# Patient Record
Sex: Male | Born: 1940
Health system: Southern US, Community
[De-identification: ages and names within clinical notes are randomized; demographics above are authoritative.]

## PROBLEM LIST (undated history)

## (undated) DIAGNOSIS — M1710 Unilateral primary osteoarthritis, unspecified knee: Secondary | ICD-10-CM

## (undated) DIAGNOSIS — I4891 Unspecified atrial fibrillation: Secondary | ICD-10-CM

## (undated) DIAGNOSIS — I209 Angina pectoris, unspecified: Secondary | ICD-10-CM

## (undated) DIAGNOSIS — I499 Cardiac arrhythmia, unspecified: Secondary | ICD-10-CM

## (undated) DIAGNOSIS — Z7901 Long term (current) use of anticoagulants: Secondary | ICD-10-CM

## (undated) DIAGNOSIS — E785 Hyperlipidemia, unspecified: Secondary | ICD-10-CM

## (undated) DIAGNOSIS — F329 Major depressive disorder, single episode, unspecified: Secondary | ICD-10-CM

## (undated) DIAGNOSIS — F32A Depression, unspecified: Secondary | ICD-10-CM

## (undated) DIAGNOSIS — M179 Osteoarthritis of knee, unspecified: Secondary | ICD-10-CM

## (undated) DIAGNOSIS — Z860101 Personal history of adenomatous and serrated colon polyps: Secondary | ICD-10-CM

## (undated) DIAGNOSIS — Z955 Presence of coronary angioplasty implant and graft: Secondary | ICD-10-CM

## (undated) DIAGNOSIS — Z86010 Personal history of colonic polyps: Secondary | ICD-10-CM

## (undated) DIAGNOSIS — S83207A Unspecified tear of unspecified meniscus, current injury, left knee, initial encounter: Secondary | ICD-10-CM

## (undated) DIAGNOSIS — I2510 Atherosclerotic heart disease of native coronary artery without angina pectoris: Secondary | ICD-10-CM

## (undated) DIAGNOSIS — T7840XA Allergy, unspecified, initial encounter: Secondary | ICD-10-CM

## (undated) DIAGNOSIS — I10 Essential (primary) hypertension: Secondary | ICD-10-CM

## (undated) DIAGNOSIS — Z9189 Other specified personal risk factors, not elsewhere classified: Secondary | ICD-10-CM

## (undated) DIAGNOSIS — F419 Anxiety disorder, unspecified: Secondary | ICD-10-CM

## (undated) DIAGNOSIS — IMO0001 Reserved for inherently not codable concepts without codable children: Secondary | ICD-10-CM

## (undated) HISTORY — PX: JOINT REPLACEMENT: SHX530

## (undated) HISTORY — PX: OTHER SURGICAL HISTORY: SHX169

## (undated) HISTORY — PX: SHOULDER ARTHROSCOPY WITH ROTATOR CUFF REPAIR: SHX5685

## (undated) HISTORY — DX: Allergy, unspecified, initial encounter: T78.40XA

## (undated) HISTORY — PX: COLONOSCOPY: SHX174

## (undated) HISTORY — PX: HERNIA REPAIR: SHX51

## (undated) HISTORY — PX: VASECTOMY: SHX75

## (undated) HISTORY — DX: Hyperlipidemia, unspecified: E78.5

## (undated) HISTORY — DX: Depression, unspecified: F32.A

## (undated) HISTORY — DX: Atherosclerotic heart disease of native coronary artery without angina pectoris: I25.10

## (undated) HISTORY — PX: CARDIAC CATHETERIZATION: SHX172

## (undated) HISTORY — PX: KNEE ARTHROSCOPY: SUR90

## (undated) HISTORY — DX: Major depressive disorder, single episode, unspecified: F32.9

---

## 1967-01-27 HISTORY — PX: OTHER SURGICAL HISTORY: SHX169

## 1968-01-27 HISTORY — PX: TONSILLECTOMY: SUR1361

## 2001-03-08 ENCOUNTER — Ambulatory Visit (HOSPITAL_COMMUNITY): Admission: RE | Admit: 2001-03-08 | Discharge: 2001-03-08 | Payer: Self-pay | Admitting: *Deleted

## 2001-03-08 ENCOUNTER — Encounter: Payer: Self-pay | Admitting: Specialist

## 2001-03-23 ENCOUNTER — Encounter: Admission: RE | Admit: 2001-03-23 | Discharge: 2001-04-22 | Payer: Self-pay | Admitting: *Deleted

## 2006-02-25 ENCOUNTER — Encounter: Admission: RE | Admit: 2006-02-25 | Discharge: 2006-02-25 | Payer: Self-pay | Admitting: Specialist

## 2006-03-25 ENCOUNTER — Encounter: Admission: RE | Admit: 2006-03-25 | Discharge: 2006-03-25 | Payer: Self-pay | Admitting: Specialist

## 2006-05-25 ENCOUNTER — Inpatient Hospital Stay (HOSPITAL_COMMUNITY): Admission: RE | Admit: 2006-05-25 | Discharge: 2006-05-28 | Payer: Self-pay | Admitting: Orthopedic Surgery

## 2006-05-25 HISTORY — PX: TOTAL HIP ARTHROPLASTY: SHX124

## 2008-06-22 ENCOUNTER — Encounter: Admission: RE | Admit: 2008-06-22 | Discharge: 2008-06-22 | Payer: Self-pay | Admitting: Specialist

## 2009-01-26 HISTORY — PX: LAPAROSCOPIC CHOLECYSTECTOMY: SUR755

## 2010-02-16 ENCOUNTER — Encounter: Payer: Self-pay | Admitting: Specialist

## 2010-06-13 NOTE — Op Note (Signed)
NAMEBENZ, Eddie Price                ACCOUNT NO.:  [id number]   MEDICAL RECORD NO.:  [id number]          PATIENT TYPE:  INP   LOCATION:  0001                         FACILITY:  [facility]   PHYSICIAN:  Eddie Price, M.D.  DATE OF BIRTH:  03/07/1940   DATE OF PROCEDURE:  05/25/2006  DATE OF DISCHARGE:                               OPERATIVE REPORT   PREOPERATIVE DIAGNOSIS:  Right hip advanced osteoarthritis.   POSTOPERATIVE DIAGNOSIS:  Right hip advanced osteoarthritis.   PROCEDURE:  Right total hip placement.   COMPONENTS USED:  DePuy hip system size 54 pinnacle cup, 36 metal  neutral liner, Tri-Lock 13.8 lateralized offset stem with a 36 +8 Delta  ceramic ball.   SURGEON:  Eddie Price, M.D.   ASSISTANT:  Jamelle Rushing, PA-C.   ANESTHESIA:  General.   BLOOD LOSS:  400 cc.   DRAINS:  None.   COMPLICATIONS:  None   INDICATIONS FOR PROCEDURE:  [name] is a 70 year old gentleman kindly  referred for evaluation of his right hip.  He had failed conservative  measures including injections and anti-inflammatories.  He was having  decreased functional activity of life, decreased quality of life and  wished to proceed with surgical intervention.  We reviewed the risks of  infection, dislocation, DVT, need for future revision surgery as well as  the postoperative course and expectations.  Consent was obtained.   PROCEDURE IN DETAIL:  The patient was brought to operative theater.  Once adequate anesthesia and preoperative antibiotics, 2 grams of Ancef,  were administered the patient was positioned in the left lateral  decubitus position with the right side up.  The right hip area was pre  scrubbed and prepped and draped in standard fashion.  A lateral based  incision was made for posterior approach to the hip.  The iliotibial  band and gluteus fascia was incised in line with incision for posterior  approach.  The short external rotators were identified and taken down  separate from the posterior capsule.  An L capsulotomy was made  preserving the posterior leaflet for later anatomic repair as well as  protection against the sciatic nerve with retractors.  The hip was  dislocated.  A neck osteotomy was made based off anatomic landmarks  based on the center of the head in relationship with tip of the  trochanter.  Femoral head degenerative changes were noted with exposed  bone.   At this point attention was first directed to the femur.  Femoral  preparation was carried out per protocol for the Tri-Lock system with a  box osteotome set in anteversion at 20-25 degrees.  I then used a hand  reamer to open up the canal to allow for irrigation to prevent fat  emboli.  I then began broaching with the 7.5 broach and carried this all  the way up to a 13.8.  This had initial excellent fit with no torsional  motion.   I then packed the femoral canal with a sponge and attended to the  acetabulum.  Acetabular exposure was obtained with the retractor  placement  and labrumectomy.  I began reaming with the 45 reamer and  carried this up to 53.  The patient had a little bit of sclerotic bone.  With the use of 52 reamer, I was able to penetrate through to get good  subchondral bleeding bone.   Based on the appearance of his bone and the sclerotic nature of it, I  decided to not use the ASR component and instead used a pinnacle cup.  This would allow for further initial stability with a screw.  The 54  pinnacle cup was then impacted to the level of the visualized bone  beneath it.  I then used a single screw into the ileum with an excellent  purchase securing the fit.  Trial liner placed.  Attention was  redirected back to the femur where the 13.8 broach was placed.  This  broach sat at the level of the neck cut.  I trialed with the first 36  +1.5 and then all way up to an actually a 36 +8 ball.  With the +8 ball  in extension, there was 1 mm or 2 of shuck.  The leg  lengths appeared to  be identical to that in the preop position with the legs in a down  position.   The hip was very stable throughout the range of motion.  However, with  tension on the soft tissues I felt the 36 +8 ball would be the best.  This with affect leg lengths only by 1 mm compared to the +5 ball.  Given this, the final hole eliminator was placed into the cup following  removal of the trial components.  The 36 metal liner was placed.  The  13.8 lateralized offset stem was then impacted at the level where the  broach was placed.  I retrialed with the +8 to make sure that I was  happy with the range of motion and stability and it was just as the  trial was.  At this point the final 36 +8.5 ceramic ball was then  impacted on the clean and dry trunnion.  The hip was reduced.  The hip  range of motion was excellent with no evidence of impingement with soft  tissues tensioned appropriately . Again compared to the down leg from  the preoperative position the leg lengths appeared to be identical.   At this point the wound was irrigated.  I reapproximated the posterior  leaflet to the superior leaflet of the capsule using #1 Ethibond.  Following repeat irrigation, I injected 5 cc of FloSeal into the  posterior soft tissue musculature region to help decrease any potential  oozing.  The iliotibial band was then reapproximated using #1 Ethibond  and a #1 running Vicryl on the gluteal fascia.  The remaining wound was  closed with 2-0 Vicryl and a running 4-0 Monocryl.  The patient was  positioned supine on the hospital bed, extubated and brought to the  recovery room with a sterile dressing in place.      Eddie Price, M.D.  Electronically Signed     MDO/MEDQ  D:  05/25/2006  T:  05/25/2006  Job:  [id number]

## 2010-06-13 NOTE — H&P (Signed)
NAMEBERNHARD, Eddie Price                ACCOUNT NO.:  [id number]   MEDICAL RECORD NO.:  [id number]        PATIENT TYPE:  LINP   LOCATION:                               FACILITY:  [facility]   PHYSICIAN:  Madlyn Frankel. Charlann Boxer, M.D.  DATE OF BIRTH:  07-09-1940   DATE OF ADMISSION:  05/25/2006  DATE OF DISCHARGE:                              HISTORY & PHYSICAL   PROCEDURE:  Right total hip arthroplasty.   CHIEF COMPLAINTS:  Right hip pain.   HISTORY OF PRESENT ILLNESS:  This is a 70 year old male with a history  persistent progressive knee and hip pain since May 1997.  He was kindly  referred to [place] by Dr. Hayden Rasmussen.  His pain has been unrelieved with  any kind of conservative treatment.  He has been cleared presurgically  by Dr. Zoe Lan.   PAST MEDICAL HISTORY:  1. Osteoarthritis.  2. Hypertension.  3. Degenerative disk disease in his neck.   PAST SURGICAL HISTORY:  1. Tonsillectomy.  2. Bilateral shoulder surgery.  3. Left elbow surgeries, 1969.   FAMILY HISTORY:  The patient is married.  Primary caregiver will be his  wife after surgery.   DRUG ALLERGIES:  SULFA.   MEDICATIONS:  1. Atenolol 50 mg, one p.o. daily.  2. Zoloft 50 mg, one p.o. daily.  3. Aspirin 81 mg, one p.o. daily.  4. Mobic 7.5 mg, one p.o. daily which he has stopped.   REVIEW OF SYSTEMS:  None other than HPI.   PHYSICAL EXAMINATION:  VITAL SIGNS:  Pulse is 72, respirations 18, blood  pressure 129/82.  GENERAL:  He is awake, alert and oriented, well-developed, well-  nourished, no acute distress.  NECK:  Supple.  No carotid bruits.  CHEST/LUNGS:  Clear to auscultation bilaterally.  BREASTS:  Deferred.  HEART:  Regular rate and rhythm without gallops, clicks, rubs or  murmurs.  ABDOMEN:  Soft, nontender, nondistended.  Bowel sounds present all four  quadrants.  GENITOURINARY:  Deferred.  EXTREMITIES:  He walks with a Trendelenburg gait.  Painful to internal  range of motion right hip.  SKIN:  No  cellulitis.  NEUROLOGIC:  Intact distal sensibilities.   Labs, EKG, chest x-ray are all pending presurgical testing.   IMPRESSION:  Right hip osteoarthritis.   PLAN OF ACTION:  Right total hip arthroplasty at [facility] on May 25, 2006, by surgeon Dr. Durene Romans.  Questions were encouraged,  answered, and reviewed.  Risks and complications were discussed.     ______________________________  Yetta Glassman [address], [place]      Madlyn Frankel. Charlann Boxer, M.D.  Electronically Signed    BLM/MEDQ  D:  05/14/2006  T:  05/14/2006  Job:  [id number]   cc:   Zoe Lan, Dr.

## 2010-06-13 NOTE — Discharge Summary (Signed)
Eddie Price, Eddie Price                ACCOUNT NO.:  [id number]   MEDICAL RECORD NO.:  [id number]          PATIENT TYPE:  INP   LOCATION:  1616                         FACILITY:  [facility]   PHYSICIAN:  Madlyn Frankel. Charlann Boxer, M.D.  DATE OF BIRTH:  [birth year]   DATE OF ADMISSION:  05/25/2006  DATE OF DISCHARGE:  05/28/2006                               DISCHARGE SUMMARY   ADMISSION DIAGNOSES:  1. Osteoarthritis.  2. Degenerative disk disease.  3. Hypertension.   DISCHARGE DIAGNOSES:  1. Osteoarthritis.  2. Degenerative disk disease.  3. Hypertension.   HISTORY OF PRESENT ILLNESS:  This is a 70 year old male, has a history  of progressive knee and hip pain since 1997.  Pain in his hip has been  unrelieved with conservative measures.  Radiographic evaluation:  Displaced significant osteoarthritis with loss of joint space.  He had  diminished quality of life and was scheduled for a right total hip  replacement.   CONSULTATION:  None.   PROCEDURE:  Right total hip replacement.   SURGEON:  Dr. Durene Romans.   ASSISTANT:  Oneida Alar, PA-C.   COMPONENTS USED:  Was a metal wire with a ceramic femoral head.   LABS:  Pre-admission CBC, hematocrit 46.  Post-op day #1,  hemoglobin/hematocrit 33.6.  Postop day #2, hematocrit 31.6 and stable.  Pre-admission coagulation INR 1.0.  Pre-admission chemistries:  Sodium  142, potassium 5.0, glucose 113.  Post-op day #1, sodium 133, glucose  125.  Post-op day #2, sodium normalized at 139, glucose at 135.  His BUN  was a little low at 5.  Pre-admission GFR greater than 60 and remained  so, throughout his course of stay.  Calcium 9.6, pre-admission 7.8 post-  op day #1, 7.9 post-op day #2.  Chemistries within normal limits on GI workup.  Pre-admission UA all  negative.   EKG normal sinus rhythm.   Chest X-Ray:  No acute infiltrates or effusions, some chronic changes of  the thoracic spine.   HOSPITAL COURSE:  The patient underwent right total hip  replacement,  tolerated the procedure well, was admitted to the orthopedic floor.  His  pain was well-controlled throughout.  He remained neuromuscularly and  vascularly intact throughout his course of stay.  His bandages were  changed post-op day #2.  They were clean, dry and intact.  Wound with no  signs of infection, minimum ecchymosis, no serous ooze noted.  He was  weightbearing as tolerated throughout his course of stay.  He was Hep-  locked on post-op day #2.  DVT prophylaxis was started on post-op day  #1.  He progressed nicely with his physical therapy.  Upon discharge, he  had walked over 350 feet with the use of a rolling walker, progressing  nicely.  So, post-op day #3 he was ready for discharge for home health  care, physical therapy.   DISCHARGE DISPOSITION:  Discharge home stable and improved, to home  health care for PT and Lovenox administration.   DISCHARGE PHYSICAL THERAPY:  Goals of physical therapy:  Gait training,  maximize strength, minimize pain, increase range of motion,  encourage  independence in activities of daily living.  He is weightbearing as  tolerated with the use of a rolling walker for 2 weeks, at which point a  transition to single walking cane and then to non-assisted ambulation.   DISCHARGE MEDICATIONS:  Include:  1. Atenolol 50 mg one p.o. daily.  2. Zoloft 50 mg one p.o. daily.  3. Lovenox 40 mg, one subcu q. 24 x2 weeks, then enteric-coated      aspirin 325 mg x4 weeks, then enteric-coated aspirin 81 mg.  4. Robaxin 500 mg one p.o. q. 6  p.r.n. spasm.  5. Vicodin 1-2 p.o. q. 4-6 p.r.n. pain.  6. Colace 100 mg with p.o. b.i.d. constipation.  7. Iron 325 one p.o. t.i.d. x3 weeks.   DISCHARGE DIET:  Regular as tolerated by the patient.   DISCHARGE FOLLOWUP:  With Dr. Charlann Boxer  in two weeks for wound check.     ______________________________  Yetta Glassman. [address], [place]      Madlyn Frankel. Charlann Boxer, M.D.  Electronically Signed    BLM/MEDQ  D:   06/08/2006  T:  06/08/2006  Job:  [id number]

## 2010-11-16 ENCOUNTER — Emergency Department (HOSPITAL_COMMUNITY)
Admission: EM | Admit: 2010-11-16 | Discharge: 2010-11-16 | Disposition: A | Discharge status: Home / Self Care | Payer: Medicare Other | Attending: Emergency Medicine | Admitting: Emergency Medicine

## 2010-11-16 ENCOUNTER — Emergency Department (HOSPITAL_COMMUNITY): Payer: Medicare Other

## 2010-11-16 DIAGNOSIS — S8990XA Unspecified injury of unspecified lower leg, initial encounter: Secondary | ICD-10-CM | POA: Insufficient documentation

## 2010-11-16 DIAGNOSIS — IMO0002 Reserved for concepts with insufficient information to code with codable children: Secondary | ICD-10-CM | POA: Insufficient documentation

## 2010-11-16 DIAGNOSIS — W010XXA Fall on same level from slipping, tripping and stumbling without subsequent striking against object, initial encounter: Secondary | ICD-10-CM | POA: Insufficient documentation

## 2010-11-16 DIAGNOSIS — M25669 Stiffness of unspecified knee, not elsewhere classified: Secondary | ICD-10-CM | POA: Insufficient documentation

## 2010-11-16 DIAGNOSIS — Z7982 Long term (current) use of aspirin: Secondary | ICD-10-CM | POA: Insufficient documentation

## 2010-11-16 DIAGNOSIS — M25569 Pain in unspecified knee: Secondary | ICD-10-CM | POA: Insufficient documentation

## 2010-11-16 DIAGNOSIS — S99929A Unspecified injury of unspecified foot, initial encounter: Secondary | ICD-10-CM | POA: Insufficient documentation

## 2010-11-16 DIAGNOSIS — I10 Essential (primary) hypertension: Secondary | ICD-10-CM | POA: Insufficient documentation

## 2010-11-16 DIAGNOSIS — Z79899 Other long term (current) drug therapy: Secondary | ICD-10-CM | POA: Insufficient documentation

## 2012-06-13 ENCOUNTER — Ambulatory Visit (INDEPENDENT_AMBULATORY_CARE_PROVIDER_SITE_OTHER): Payer: Medicare Other | Admitting: Internal Medicine

## 2012-06-13 ENCOUNTER — Encounter: Payer: Self-pay | Admitting: *Deleted

## 2012-06-13 VITALS — BP 122/80 | HR 68 | Ht 71.0 in | Wt 229.2 lb

## 2012-06-13 DIAGNOSIS — K219 Gastro-esophageal reflux disease without esophagitis: Secondary | ICD-10-CM

## 2012-06-13 DIAGNOSIS — R079 Chest pain, unspecified: Secondary | ICD-10-CM

## 2012-06-13 DIAGNOSIS — I10 Essential (primary) hypertension: Secondary | ICD-10-CM | POA: Insufficient documentation

## 2012-06-13 DIAGNOSIS — Z79899 Other long term (current) drug therapy: Secondary | ICD-10-CM | POA: Insufficient documentation

## 2012-06-13 DIAGNOSIS — E789 Disorder of lipoprotein metabolism, unspecified: Secondary | ICD-10-CM | POA: Insufficient documentation

## 2012-06-13 DIAGNOSIS — F4323 Adjustment disorder with mixed anxiety and depressed mood: Secondary | ICD-10-CM

## 2012-06-13 DIAGNOSIS — J309 Allergic rhinitis, unspecified: Secondary | ICD-10-CM

## 2012-06-13 DIAGNOSIS — R109 Unspecified abdominal pain: Secondary | ICD-10-CM

## 2012-06-13 DIAGNOSIS — M5030 Other cervical disc degeneration, unspecified cervical region: Secondary | ICD-10-CM | POA: Insufficient documentation

## 2012-06-13 DIAGNOSIS — K7689 Other specified diseases of liver: Secondary | ICD-10-CM

## 2012-06-13 DIAGNOSIS — F528 Other sexual dysfunction not due to a substance or known physiological condition: Secondary | ICD-10-CM

## 2012-06-13 DIAGNOSIS — N529 Male erectile dysfunction, unspecified: Secondary | ICD-10-CM | POA: Insufficient documentation

## 2012-06-13 NOTE — Patient Instructions (Signed)
LABS TODAY:  Cbc, bmet, pt/inr, ptt  Your physician has requested that you have a cardiac catheterization. Cardiac catheterization is used to diagnose and/or treat various heart conditions. Doctors may recommend this procedure for a number of different reasons. The most common reason is to evaluate chest pain. Chest pain can be a symptom of coronary artery disease (CAD), and cardiac catheterization can show whether plaque is narrowing or blocking your heart's arteries. This procedure is also used to evaluate the valves, as well as measure the blood flow and oxygen levels in different parts of your heart. For further information please visit [email or web address] Please follow instruction sheet, as given.

## 2012-06-13 NOTE — Progress Notes (Signed)
HPI Patient referred from [facility] for CP  Patient seen on 5/13  Compliained of a 3 wk history of CP /SOB.  Substernal  Radiating to arms.  Occurs with moderate exertion.   No pain at rest.   Had stress test in [place] approx 10 years ago.  Woke up with CP then  Different from current symptoms.  Lipids in December LDL was 91, HDL 29, Trig 198 Allergies  Allergen Reactions  . Sulfa Antibiotics     RASH    Current Outpatient Prescriptions  Medication Sig Dispense Refill  . aspirin 81 MG tablet Take 81 mg by mouth daily.      Marland Kitchen atenolol (TENORMIN) 50 MG tablet       . fish oil-omega-3 fatty acids 1000 MG capsule Take 2 g by mouth daily.      . Multiple Vitamin (MULTIVITAMIN) capsule Take 1 capsule by mouth daily.      Marland Kitchen omega-3 acid ethyl esters (LOVAZA) 1 G capsule Take 2 g by mouth daily.       . sertraline (ZOLOFT) 100 MG tablet Take 100 mg by mouth daily.      . sildenafil (VIAGRA) 100 MG tablet Take 100 mg by mouth daily as needed for erectile dysfunction.      . vitamin E (VITAMIN E) 400 UNIT capsule Take 800 Units by mouth daily.       No current facility-administered medications for this visit.    No past medical history on file.  Past Surgical History  Procedure Laterality Date  . Complete colonoscopy  07-2010  . Knee surgery Right   . Choecystectomy    . Tonsillectomy    . Shoulder surgery Bilateral   . Total hip arthroplasty      Family History  Problem Relation Age of Onset  . Heart failure Mother   . Heart failure Father   . Stroke Father   . Heart attack Father     History   Social History  . Marital Status: Married    Spouse Name: N/A    Number of Children: N/A  . Years of Education: N/A   Occupational History  . Not on file.   Social History Main Topics  . Smoking status: Not on file  . Smokeless tobacco: Not on file  . Alcohol Use: Not on file  . Drug Use: Not on file  . Sexually Active: Not on file   Other Topics Concern  . Not on  file   Social History Narrative  . No narrative on file    Review of Systems:  All systems reviewed.  They are negative to the above problem except as previously stated.  Vital Signs: BP 122/80  Pulse 68  Ht 5\' 11"  (1.803 m)  Wt 229 lb 4 oz (103.987 kg)  BMI 31.99 kg/m2  Physical Exam  HEENT:  Normocephalic, atraumatic. EOMI, PERRLA.  Neck: JVP is normal.  No bruits.  Lungs: clear to auscultation. No rales no wheezes.  Heart: Regular rate and rhythm. Normal S1, S2. No S3.   No significant murmurs. PMI not displaced.  Abdomen:  Supple, nontender. Normal bowel sounds. No masses. No hepatomegaly.  Extremities:   Good distal pulses throughout. No lower extremity edema.  Musculoskeletal :moving all extremities.  Neuro:   alert and oriented x3.  CN II-XII grossly intact.  EKG  5/13:  SR. 64 bpm Assessment and Plan:  1.  CP   History consistent with exertional angina.  No rest symptoms  I would recomm L heart cath to define anatomy.  Discussed procedure, risks/benefits with patient  He understands and agrees to proceed Keep on same medicines.  NTG prn  Come to hosp if worsens prior.  2  HL  Keep on same regimen for now  Define anatomy.  3.  Obesity  Will need to address on future visits.

## 2012-06-14 ENCOUNTER — Encounter: Payer: Self-pay | Admitting: Internal Medicine

## 2012-06-14 ENCOUNTER — Encounter: Payer: Self-pay | Admitting: *Deleted

## 2012-06-14 LAB — CBC WITH DIFFERENTIAL/PLATELET
Eosinophils Absolute: 0.3 10*3/uL (ref 0.0–0.7)
Eosinophils Relative: 5 % (ref 0–5)
HCT: 45.7 % (ref 39.0–52.0)
Lymphocytes Relative: 39 % (ref 12–46)
Lymphs Abs: 2.8 10*3/uL (ref 0.7–4.0)
MCH: 32.6 pg (ref 26.0–34.0)
MCV: 94.4 fL (ref 78.0–100.0)
Monocytes Absolute: 0.9 10*3/uL (ref 0.1–1.0)
Monocytes Relative: 12 % (ref 3–12)
Platelets: 241 10*3/uL (ref 150–400)
RBC: 4.84 MIL/uL (ref 4.22–5.81)
WBC: 7.3 10*3/uL (ref 4.0–10.5)

## 2012-06-14 LAB — BASIC METABOLIC PANEL
BUN: 9 mg/dL (ref 6–23)
CO2: 28 mEq/L (ref 19–32)
Calcium: 9.9 mg/dL (ref 8.4–10.5)
Chloride: 105 mEq/L (ref 96–112)
Creat: 1.06 mg/dL (ref 0.50–1.35)
Glucose, Bld: 81 mg/dL (ref 70–99)

## 2012-06-14 LAB — APTT: aPTT: 44 seconds — ABNORMAL HIGH (ref 24–37)

## 2012-06-15 ENCOUNTER — Inpatient Hospital Stay (HOSPITAL_BASED_OUTPATIENT_CLINIC_OR_DEPARTMENT_OTHER)
Admission: RE | Admit: 2012-06-15 | Discharge: 2012-06-15 | Disposition: A | Discharge status: Hospice | Payer: Medicare Other | Source: Ambulatory Visit | Attending: Cardiovascular Disease | Admitting: Cardiovascular Disease

## 2012-06-15 ENCOUNTER — Inpatient Hospital Stay (HOSPITAL_COMMUNITY)
Admission: AD | Admit: 2012-06-15 | Discharge: 2012-06-18 | DRG: 247 | Disposition: A | Discharge status: Home / Self Care | Payer: Medicare Other | Source: Ambulatory Visit | Attending: Cardiovascular Disease | Admitting: Cardiovascular Disease

## 2012-06-15 ENCOUNTER — Encounter (HOSPITAL_COMMUNITY): Payer: Self-pay | Admitting: *Deleted

## 2012-06-15 ENCOUNTER — Encounter (HOSPITAL_BASED_OUTPATIENT_CLINIC_OR_DEPARTMENT_OTHER)
Admission: RE | Disposition: A | Discharge status: Hospice | Payer: Self-pay | Source: Ambulatory Visit | Attending: Cardiovascular Disease

## 2012-06-15 DIAGNOSIS — M5030 Other cervical disc degeneration, unspecified cervical region: Secondary | ICD-10-CM

## 2012-06-15 DIAGNOSIS — I10 Essential (primary) hypertension: Secondary | ICD-10-CM | POA: Insufficient documentation

## 2012-06-15 DIAGNOSIS — I200 Unstable angina: Secondary | ICD-10-CM | POA: Insufficient documentation

## 2012-06-15 DIAGNOSIS — Z955 Presence of coronary angioplasty implant and graft: Secondary | ICD-10-CM

## 2012-06-15 DIAGNOSIS — R109 Unspecified abdominal pain: Secondary | ICD-10-CM

## 2012-06-15 DIAGNOSIS — J309 Allergic rhinitis, unspecified: Secondary | ICD-10-CM

## 2012-06-15 DIAGNOSIS — K7689 Other specified diseases of liver: Secondary | ICD-10-CM

## 2012-06-15 DIAGNOSIS — Z7982 Long term (current) use of aspirin: Secondary | ICD-10-CM

## 2012-06-15 DIAGNOSIS — F4323 Adjustment disorder with mixed anxiety and depressed mood: Secondary | ICD-10-CM

## 2012-06-15 DIAGNOSIS — E785 Hyperlipidemia, unspecified: Secondary | ICD-10-CM | POA: Diagnosis present

## 2012-06-15 DIAGNOSIS — IMO0002 Reserved for concepts with insufficient information to code with codable children: Secondary | ICD-10-CM | POA: Diagnosis not present

## 2012-06-15 DIAGNOSIS — R0602 Shortness of breath: Secondary | ICD-10-CM | POA: Insufficient documentation

## 2012-06-15 DIAGNOSIS — E7800 Pure hypercholesterolemia, unspecified: Secondary | ICD-10-CM | POA: Diagnosis present

## 2012-06-15 DIAGNOSIS — Y9210 Unspecified residential institution as the place of occurrence of the external cause: Secondary | ICD-10-CM | POA: Diagnosis not present

## 2012-06-15 DIAGNOSIS — Z7902 Long term (current) use of antithrombotics/antiplatelets: Secondary | ICD-10-CM

## 2012-06-15 DIAGNOSIS — I2510 Atherosclerotic heart disease of native coronary artery without angina pectoris: Principal | ICD-10-CM

## 2012-06-15 DIAGNOSIS — F528 Other sexual dysfunction not due to a substance or known physiological condition: Secondary | ICD-10-CM

## 2012-06-15 DIAGNOSIS — E669 Obesity, unspecified: Secondary | ICD-10-CM | POA: Insufficient documentation

## 2012-06-15 DIAGNOSIS — Y840 Cardiac catheterization as the cause of abnormal reaction of the patient, or of later complication, without mention of misadventure at the time of the procedure: Secondary | ICD-10-CM | POA: Diagnosis not present

## 2012-06-15 DIAGNOSIS — Z79899 Other long term (current) drug therapy: Secondary | ICD-10-CM

## 2012-06-15 DIAGNOSIS — K219 Gastro-esophageal reflux disease without esophagitis: Secondary | ICD-10-CM

## 2012-06-15 DIAGNOSIS — E789 Disorder of lipoprotein metabolism, unspecified: Secondary | ICD-10-CM

## 2012-06-15 DIAGNOSIS — R079 Chest pain, unspecified: Secondary | ICD-10-CM | POA: Insufficient documentation

## 2012-06-15 DIAGNOSIS — N529 Male erectile dysfunction, unspecified: Secondary | ICD-10-CM

## 2012-06-15 HISTORY — DX: Essential (primary) hypertension: I10

## 2012-06-15 HISTORY — DX: Cardiac arrhythmia, unspecified: I49.9

## 2012-06-15 HISTORY — DX: Angina pectoris, unspecified: I20.9

## 2012-06-15 LAB — CBC WITH DIFFERENTIAL/PLATELET
Basophils Absolute: 0 10*3/uL (ref 0.0–0.1)
Basophils Relative: 1 % (ref 0–1)
Hemoglobin: 14.7 g/dL (ref 13.0–17.0)
Lymphocytes Relative: 42 % (ref 12–46)
MCHC: 34.7 g/dL (ref 30.0–36.0)
Neutro Abs: 3.1 10*3/uL (ref 1.7–7.7)
Neutrophils Relative %: 46 % (ref 43–77)
RDW: 13.1 % (ref 11.5–15.5)
WBC: 6.6 10*3/uL (ref 4.0–10.5)

## 2012-06-15 LAB — COMPREHENSIVE METABOLIC PANEL
ALT: 15 U/L (ref 0–53)
Alkaline Phosphatase: 42 U/L (ref 39–117)
Chloride: 103 mEq/L (ref 96–112)
Creatinine, Ser: 0.83 mg/dL (ref 0.50–1.35)
GFR calc Af Amer: >90 mL/min (ref >90)
GFR calc non Af Amer: 88 mL/min — ABNORMAL LOW (ref >90)
Potassium: 4.4 mEq/L (ref 3.5–5.1)

## 2012-06-15 LAB — TROPONIN I: Troponin I: <0.3 ng/mL (ref <0.30)

## 2012-06-15 SURGERY — JV LEFT HEART CATHETERIZATION WITH CORONARY ANGIOGRAM
Anesthesia: Moderate Sedation

## 2012-06-15 MED ORDER — ATORVASTATIN CALCIUM 40 MG PO TABS
40.0000 mg | ORAL_TABLET | Freq: Every day | ORAL | Status: DC
  Administered 2012-06-15 – 2012-06-17 (×3): 40 mg via ORAL
  Filled 2012-06-15 (×5): qty 1

## 2012-06-15 MED ORDER — SODIUM CHLORIDE 0.9 % IV SOLN: INTRAVENOUS | Status: DC

## 2012-06-15 MED ORDER — HEPARIN (PORCINE) IN NACL 100-0.45 UNIT/ML-% IJ SOLN
1500.0000 [IU]/h | INTRAMUSCULAR | Status: DC
  Administered 2012-06-15: 1350 [IU]/h via INTRAVENOUS
  Administered 2012-06-16 – 2012-06-17 (×3): 1500 [IU]/h via INTRAVENOUS
  Filled 2012-06-15 (×6): qty 250

## 2012-06-15 MED ORDER — ACETAMINOPHEN 325 MG PO TABS: 650.0000 mg | ORAL_TABLET | ORAL | Status: DC | PRN

## 2012-06-15 MED ORDER — ASPIRIN EC 81 MG PO TBEC
81.0000 mg | DELAYED_RELEASE_TABLET | Freq: Every day | ORAL | Status: DC
  Administered 2012-06-16 – 2012-06-18 (×2): 81 mg via ORAL
  Filled 2012-06-15 (×3): qty 1

## 2012-06-15 MED ORDER — NITROGLYCERIN 0.4 MG SL SUBL: 0.4000 mg | SUBLINGUAL_TABLET | SUBLINGUAL | Status: DC | PRN

## 2012-06-15 MED ORDER — ATENOLOL 50 MG PO TABS
50.0000 mg | ORAL_TABLET | Freq: Every day | ORAL | Status: DC
  Administered 2012-06-15 – 2012-06-18 (×3): 50 mg via ORAL
  Filled 2012-06-15 (×4): qty 1

## 2012-06-15 MED ORDER — GUAIFENESIN 100 MG/5ML PO SYRP: 200.0000 mg | ORAL_SOLUTION | ORAL | Status: DC | PRN

## 2012-06-15 MED ORDER — ASPIRIN 81 MG PO CHEW
324.0000 mg | CHEWABLE_TABLET | ORAL | Status: AC
  Administered 2012-06-15: 324 mg via ORAL
  Filled 2012-06-15: qty 3
  Filled 2012-06-15: qty 1

## 2012-06-15 MED ORDER — SERTRALINE HCL 100 MG PO TABS
100.0000 mg | ORAL_TABLET | Freq: Every day | ORAL | Status: DC
  Administered 2012-06-15 – 2012-06-16 (×2): 100 mg via ORAL
  Administered 2012-06-18: 50 mg via ORAL
  Filled 2012-06-15 (×4): qty 1

## 2012-06-15 MED ORDER — ONDANSETRON HCL 4 MG/2ML IJ SOLN
4.0000 mg | Freq: Four times a day (QID) | INTRAMUSCULAR | Status: DC | PRN
  Filled 2012-06-15: qty 2

## 2012-06-15 MED ORDER — CLOPIDOGREL BISULFATE 300 MG PO TABS
600.0000 mg | ORAL_TABLET | Freq: Once | ORAL | Status: AC
  Administered 2012-06-15: 600 mg via ORAL
  Filled 2012-06-15: qty 2

## 2012-06-15 MED ORDER — CLOPIDOGREL BISULFATE 75 MG PO TABS
75.0000 mg | ORAL_TABLET | Freq: Every day | ORAL | Status: DC
  Administered 2012-06-16 – 2012-06-17 (×2): 75 mg via ORAL
  Filled 2012-06-15 (×4): qty 1

## 2012-06-15 MED ORDER — ONDANSETRON HCL 4 MG/2ML IJ SOLN: 4.0000 mg | Freq: Four times a day (QID) | INTRAMUSCULAR | Status: DC | PRN

## 2012-06-15 MED ORDER — ASPIRIN 300 MG RE SUPP
300.0000 mg | RECTAL | Status: AC
  Filled 2012-06-15: qty 1

## 2012-06-15 NOTE — OR Nursing (Signed)
+  Allen's right hand

## 2012-06-15 NOTE — CV Procedure (Addendum)
Cardiac Catheterization Operative Report  LANDER ESLICK [id number] 5/21/201411:53 AM No primary provider on file.  Procedure Performed:  1. Left Heart Catheterization 2. Selective Coronary Angiography 3. Left ventricular angiogram  Operator: Verne Carrow, MD  Indication:  72 yo male with history of  HTN with recent complaints exertional chest pain and SOB. He describes chest pain radiating to both arms with minimal exertion. (class III angina). No functional study.                                Procedure Details: The risks, benefits, complications, treatment options, and expected outcomes were discussed with the patient. The patient and/or family concurred with the proposed plan, giving informed consent. The patient was brought to the cath lab after IV hydration was begun and oral premedication was given. The patient was further sedated with Versed and Fentanyl. The right groin was prepped and draped in the usual manner. Using the modified Seldinger access technique, a 4 French sheath was placed in the right femoral artery.  A JL-5 catheter was used to engage the left main artery. Selective angiography of the left coronary system was performed. A 3DRC catheter was used to non-selectively visualize the RCA. The RCA has a severe ostial stenosis.  A pigtail catheter was used to perform a left ventricular angiogram.  There were no immediate complications. The patient was taken to the recovery area in stable condition.   Hemodynamic Findings: Central aortic pressure: 113/53 Left ventricular pressure: 141/11/20  Angiographic Findings:  Left main: No obstructive disease.   Left Anterior Descending Artery: Moderate caliber vessel that courses to the apex. The proximal vessel has mild plaque disease. The mid vessel has a 30% stenosis at the takeoff of the small caliber diagonal branch. The remainder of the LAD has mild plaque disease. The small caliber diagonal branch (1.5-1.75 mm)  has a focal 99% stenosis.   Circumflex Artery: Small to moderate caliber vessel with 20% ostial stenosis. The first obtuse marginal branch is small in caliber (1.5 -1.75 mm) with proximal 80% stenosis. The second obtuse marginal branch is small in caliber (1.5 mm vessel) with focal 99% stenosis.   Right Coronary Artery: Large caliber, dominant vessel with 99% ostial stenosis. The mid vessel has a 30-40% stenosis. The distal vessel has an eccentric 20% stenosis. The PDA is a small caliber vessel with no obstructive disease. The posterolateral branch is a medium caliber vessel with no obstructive disease.   Left Ventricular Angiogram: LVEF 55%.   Impression: 1. 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  2. Preserved LV systolic function 3. Unstable angina, class III.   Recommendations: He has three vessel CAD. He is having unstable symptoms. Will admit to telemetry unit. Will start ASA and begin a heparin drip. Will start statin/beta blocker. Will review films with colleagues. He will need revascularization with stenting or CABG. His branch vessels are small in caliber and I am not sure CABG would give a good result. His LAD has no focal lesions. I am leaning toward PCI of the RCA in the next 1-2 days depending on the cath lab schedule and then reassess the lesions in the left system. It appears that the stenoses in the OM branches and diagonal branch are severe but the vessels are small in caliber. Will wait to load with Plavix until  CABG has been excluded as an option for therapy.        Complications:  None. The patient tolerated the procedure well.

## 2012-06-15 NOTE — OR Nursing (Signed)
Report called to [place], RN on 2000

## 2012-06-15 NOTE — OR Nursing (Signed)
Tegaderm dressing applied, site level 0, bedrest begins at 1205

## 2012-06-15 NOTE — OR Nursing (Signed)
Transported to 2028 via stretcher, on monitor, without change

## 2012-06-15 NOTE — Progress Notes (Signed)
ANTICOAGULATION CONSULT NOTE - Initial Consult  Pharmacy Consult for Heparin Indication: chest pain/ACS  Allergies  Allergen Reactions  . Sulfa Antibiotics     RASH    Patient Measurements: Height: 5\' 11"  (180.3 cm) Weight: 229 lb (103.874 kg) IBW/kg (Calculated) : 75.3 Heparin Dosing Weight: 97.1 kg  Vital Signs: BP: 126/80 mmHg (05/21 1314)  Labs:  Recent Labs  06/13/12 1355  HGB 15.8  HCT 45.7  PLT 241  APTT 44*  LABPROT 12.8  INR 0.96  CREATININE 1.06    Estimated Creatinine Clearance: 80.7 ml/min (by C-G formula based on Cr of 1.06).   Medical History: No past medical history on file.  Medications:  Prescriptions prior to admission  Medication Sig Dispense Refill  . aspirin 81 MG tablet Take 81 mg by mouth daily.      Marland Kitchen atenolol (TENORMIN) 50 MG tablet       . fish oil-omega-3 fatty acids 1000 MG capsule Take 2 g by mouth daily.      . Multiple Vitamin (MULTIVITAMIN) capsule Take 1 capsule by mouth daily.      Marland Kitchen omega-3 acid ethyl esters (LOVAZA) 1 G capsule Take 2 g by mouth daily.       . sertraline (ZOLOFT) 100 MG tablet Take 100 mg by mouth daily.      . sildenafil (VIAGRA) 100 MG tablet Take 100 mg by mouth daily as needed for erectile dysfunction.      . vitamin E (VITAMIN E) 400 UNIT capsule Take 800 Units by mouth daily.        Assessment: 72 y/o male who had cath today after complaints of exertional chest pain and SOB. He was found to have 3 vessel CAD and is being evaluated for revascularization vs CABG. Pharmacy consulted to begin a heparin drip. Sheath was pulled at ~11:45 this morning. Spoke with Dr. Clifton James and he would like the heparin to begin 6 hrs post sheath removal.  Goal of Therapy:  Heparin level 0.3-0.7 units/ml Monitor platelets by anticoagulation protocol: Yes   Plan:  -Begin heparin drip at 1350 units/hr IV with no bolus at 18:00 -Heparin level 6 hours after started -Daily heparin level and CBC while on heparin -Monitor  for signs/symptoms of bleeding  [facility], [place].D., BCPS Clinical Pharmacist Pager: [phone] 06/15/2012 2:05 PM

## 2012-06-15 NOTE — Interval H&P Note (Signed)
History and Physical Interval Note:  06/15/2012 11:10 AM  Eddie Price  has presented today for cardiac cath with the diagnosis of CP  The various methods of treatment have been discussed with the patient and family. After consideration of risks, benefits and other options for treatment, the patient has consented to  Procedure(s): JV LEFT HEART CATHETERIZATION WITH CORONARY ANGIOGRAM (N/A) as a surgical intervention .  The patient's history has been reviewed, patient examined, no change in status, stable for surgery.  I have reviewed the patient's chart and labs.  Questions were answered to the patient's satisfaction.     MCALHANY,CHRISTOPHER

## 2012-06-15 NOTE — OR Nursing (Signed)
Dr McAlhany at bedside to discuss results and treatment plan with pt and family 

## 2012-06-15 NOTE — H&P (View-Only) (Signed)
HPI Patient referred from [facility] for CP  Patient seen on 5/13  Compliained of a 3 wk history of CP /SOB.  Substernal  Radiating to arms.  Occurs with moderate exertion.   No pain at rest.   Had stress test in [place] approx 10 years ago.  Woke up with CP then  Different from current symptoms.  Lipids in December LDL was 91, HDL 29, Trig 198 Allergies  Allergen Reactions  . Sulfa Antibiotics     RASH    Current Outpatient Prescriptions  Medication Sig Dispense Refill  . aspirin 81 MG tablet Take 81 mg by mouth daily.      . atenolol (TENORMIN) 50 MG tablet       . fish oil-omega-3 fatty acids 1000 MG capsule Take 2 g by mouth daily.      . Multiple Vitamin (MULTIVITAMIN) capsule Take 1 capsule by mouth daily.      . omega-3 acid ethyl esters (LOVAZA) 1 G capsule Take 2 g by mouth daily.       . sertraline (ZOLOFT) 100 MG tablet Take 100 mg by mouth daily.      . sildenafil (VIAGRA) 100 MG tablet Take 100 mg by mouth daily as needed for erectile dysfunction.      . vitamin E (VITAMIN E) 400 UNIT capsule Take 800 Units by mouth daily.       No current facility-administered medications for this visit.    No past medical history on file.  Past Surgical History  Procedure Laterality Date  . Complete colonoscopy  07-2010  . Knee surgery Right   . Choecystectomy    . Tonsillectomy    . Shoulder surgery Bilateral   . Total hip arthroplasty      Family History  Problem Relation Age of Onset  . Heart failure Mother   . Heart failure Father   . Stroke Father   . Heart attack Father     History   Social History  . Marital Status: Married    Spouse Name: N/A    Number of Children: N/A  . Years of Education: N/A   Occupational History  . Not on file.   Social History Main Topics  . Smoking status: Not on file  . Smokeless tobacco: Not on file  . Alcohol Use: Not on file  . Drug Use: Not on file  . Sexually Active: Not on file   Other Topics Concern  . Not on  file   Social History Narrative  . No narrative on file    Review of Systems:  All systems reviewed.  They are negative to the above problem except as previously stated.  Vital Signs: BP 122/80  Pulse 68  Ht 5' 11" (1.803 m)  Wt 229 lb 4 oz (103.987 kg)  BMI 31.99 kg/m2  Physical Exam  HEENT:  Normocephalic, atraumatic. EOMI, PERRLA.  Neck: JVP is normal.  No bruits.  Lungs: clear to auscultation. No rales no wheezes.  Heart: Regular rate and rhythm. Normal S1, S2. No S3.   No significant murmurs. PMI not displaced.  Abdomen:  Supple, nontender. Normal bowel sounds. No masses. No hepatomegaly.  Extremities:   Good distal pulses throughout. No lower extremity edema.  Musculoskeletal :moving all extremities.  Neuro:   alert and oriented x3.  CN II-XII grossly intact.  EKG  5/13:  SR. 64 bpm Assessment and Plan:  1.  CP   History consistent with exertional angina.  No rest symptoms    I would recomm L heart cath to define anatomy.  Discussed procedure, risks/benefits with patient  He understands and agrees to proceed Keep on same medicines.  NTG prn  Come to hosp if worsens prior.  2  HL  Keep on same regimen for now  Define anatomy.  3.  Obesity  Will need to address on future visits.  

## 2012-06-15 NOTE — OR Nursing (Signed)
Meal served 

## 2012-06-15 NOTE — Progress Notes (Signed)
Utilization Review Completed.Eddie Price T5/21/2014  

## 2012-06-16 ENCOUNTER — Encounter (HOSPITAL_COMMUNITY): Payer: Self-pay | Admitting: *Deleted

## 2012-06-16 DIAGNOSIS — I2510 Atherosclerotic heart disease of native coronary artery without angina pectoris: Principal | ICD-10-CM | POA: Diagnosis present

## 2012-06-16 DIAGNOSIS — I200 Unstable angina: Secondary | ICD-10-CM | POA: Diagnosis present

## 2012-06-16 LAB — LIPID PANEL
Cholesterol: 149 mg/dL (ref 0–200)
Triglycerides: 239 mg/dL — ABNORMAL HIGH (ref <150)

## 2012-06-16 LAB — TROPONIN I: Troponin I: <0.3 ng/mL (ref <0.30)

## 2012-06-16 LAB — BASIC METABOLIC PANEL
Calcium: 9 mg/dL (ref 8.4–10.5)
GFR calc Af Amer: >90 mL/min (ref >90)
GFR calc non Af Amer: 84 mL/min — ABNORMAL LOW (ref >90)
Potassium: 4.1 mEq/L (ref 3.5–5.1)
Sodium: 137 mEq/L (ref 135–145)

## 2012-06-16 LAB — HEPARIN LEVEL (UNFRACTIONATED)
Heparin Unfractionated: 0.18 IU/mL — ABNORMAL LOW (ref 0.30–0.70)
Heparin Unfractionated: 0.37 IU/mL (ref 0.30–0.70)

## 2012-06-16 MED ORDER — SODIUM CHLORIDE 0.9 % IV SOLN: 250.0000 mL | INTRAVENOUS | Status: DC | PRN

## 2012-06-16 MED ORDER — SODIUM CHLORIDE 0.9 % IJ SOLN: 3.0000 mL | Freq: Two times a day (BID) | INTRAMUSCULAR | Status: DC

## 2012-06-16 MED ORDER — SODIUM CHLORIDE 0.9 % IJ SOLN: 3.0000 mL | INTRAMUSCULAR | Status: DC | PRN

## 2012-06-16 NOTE — Progress Notes (Signed)
ANTICOAGULATION CONSULT NOTE  Pharmacy Consult for Heparin Indication: chest pain/ACS  Allergies  Allergen Reactions  . Sulfa Antibiotics     RASH    Patient Measurements: Height: 5\' 11"  (180.3 cm) Weight: 226 lb 3.1 oz (102.6 kg) IBW/kg (Calculated) : 75.3 Heparin Dosing Weight: 97.1 kg  Vital Signs: Temp: 98 F (36.7 C) (05/22 0413) Temp src: Oral (05/22 0413) BP: 146/77 mmHg (05/22 0413) Pulse Rate: 69 (05/22 0413)  Labs:  Recent Labs  06/13/12 1355 06/15/12 1427 06/15/12 1428 06/15/12 2021 06/16/12 0044 06/16/12 0940  HGB 15.8  --  14.7  --   --   --   HCT 45.7  --  42.4  --   --   --   PLT 241  --  197  --   --   --   APTT 44*  --   --   --   --   --   LABPROT 12.8  --   --   --   --   --   INR 0.96  --   --   --   --   --   HEPARINUNFRC  --   --   --   --  0.18* 0.37  CREATININE 1.06  --  0.83  --   --   --   TROPONINI  --  <0.30  --  <0.30 <0.30  --     Estimated Creatinine Clearance: 102.4 ml/min (by C-G formula based on Cr of 0.83).  Assessment: 72 y/o male s/p cardiac cath receiving heparin for ACS, heparin level now therapeutic. Plan for recath and PCI tomorrow.  Goal of Therapy:  Heparin level 0.3-0.7 units/ml Monitor platelets by anticoagulation protocol: Yes   Plan:  Continue heparin gtt at 1500 units/hr and f/u in am.  Verlene Mayer, PharmD, BCPS Pager [phone] 06/16/2012 10:41 AM

## 2012-06-16 NOTE — Progress Notes (Signed)
    SUBJECTIVE: No chest pain. No events.   BP 146/77  Pulse 69  Temp(Src) 98 F (36.7 C) (Oral)  Resp 20  Ht 5\' 11"  (1.803 m)  Wt 226 lb 3.1 oz (102.6 kg)  BMI 31.56 kg/m2  SpO2 98%  Intake/Output Summary (Last 24 hours) at 06/16/12 0740 Last data filed at 06/16/12 0414  Gross per 24 hour  Intake    360 ml  Output    200 ml  Net    160 ml    PHYSICAL EXAM General: Well developed, well nourished, in no acute distress. Alert and oriented x 3.  Psych:  Good affect, responds appropriately Neck: No JVD. No masses noted.  Lungs: Clear bilaterally with no wheezes or rhonci noted.  Heart: RRR with no murmurs noted. Abdomen: Bowel sounds are present. Soft, non-tender.  Extremities: No lower extremity edema.   LABS: Basic Metabolic Panel:  Recent Labs  65/78/46 1355 06/15/12 1428  NA 140 139  K 4.7 4.4  CL 105 103  CO2 28 26  GLUCOSE 81 124*  BUN 9 10  CREATININE 1.06 0.83  CALCIUM 9.9 8.8  MG  --  1.7   CBC:  Recent Labs  06/13/12 1355 06/15/12 1428  WBC 7.3 6.6  NEUTROABS 3.2 3.1  HGB 15.8 14.7  HCT 45.7 42.4  MCV 94.4 93.4  PLT 241 197   Cardiac Enzymes:  Recent Labs  06/15/12 1427 06/15/12 2021 06/16/12 0044  TROPONINI <0.30 <0.30 <0.30   Current Meds: . aspirin EC  81 mg Oral Daily  . atenolol  50 mg Oral Daily  . atorvastatin  40 mg Oral q1800  . clopidogrel  75 mg Oral Q breakfast  . sertraline  100 mg Oral Daily     ASSESSMENT AND PLAN:  1. CAD/Unstable angina:  Pt admitted following cardiac cath yesterday. He was found to have a severe ostial RCA stenosis as well as severe disease in a small to moderate caliber diagonal branch, small caliber OM1/OM2 branches. I have reviewed his films with my colleagues and I think PCI is the right approach. He has been loaded with Plavix. Will continue ASA, Plavix, statin, beta blocker. Continue heparin drip. Plan PCI of RCA and likely diagonal branch tomorrow am. Clear liquid breakfast in am then NPO  with plans for PCI after lunchtime.  Will place cath/PCI orders.   Eddie Price  5/22/20147:40 AM

## 2012-06-16 NOTE — Progress Notes (Signed)
ANTICOAGULATION CONSULT NOTE  Pharmacy Consult for Heparin Indication: chest pain/ACS  Allergies  Allergen Reactions  . Sulfa Antibiotics     RASH    Patient Measurements: Height: 5\' 11"  (180.3 cm) Weight: 229 lb (103.874 kg) IBW/kg (Calculated) : 75.3 Heparin Dosing Weight: 97.1 kg  Vital Signs: Temp: 98.7 F (37.1 C) (05/21 1937) Temp src: Oral (05/21 1937) BP: 111/59 mmHg (05/21 1937) Pulse Rate: 64 (05/21 1937)  Labs:  Recent Labs  06/13/12 1355 06/15/12 1427 06/15/12 1428 06/15/12 2021 06/16/12 0044  HGB 15.8  --  14.7  --   --   HCT 45.7  --  42.4  --   --   PLT 241  --  197  --   --   APTT 44*  --   --   --   --   LABPROT 12.8  --   --   --   --   INR 0.96  --   --   --   --   HEPARINUNFRC  --   --   --   --  0.18*  CREATININE 1.06  --  0.83  --   --   TROPONINI  --  <0.30  --  <0.30 <0.30    Estimated Creatinine Clearance: 103 ml/min (by C-G formula based on Cr of 0.83).  Assessment: 72 y/o male with CAD awaiting possible CABG/PCI for heparin  Goal of Therapy:  Heparin level 0.3-0.7 units/ml Monitor platelets by anticoagulation protocol: Yes   Plan:  Increase Heparin 1500 units/hr  Follow-up am labs.  Geannie Risen, PharmD, BCPS  06/16/2012 1:38 AM

## 2012-06-17 ENCOUNTER — Ambulatory Visit (HOSPITAL_COMMUNITY): Admission: RE | Admit: 2012-06-17 | Payer: Medicare Other | Source: Ambulatory Visit | Admitting: Cardiovascular Disease

## 2012-06-17 ENCOUNTER — Encounter (HOSPITAL_COMMUNITY)
Admission: AD | Disposition: A | Discharge status: Home / Self Care | Payer: Self-pay | Source: Ambulatory Visit | Attending: Cardiovascular Disease

## 2012-06-17 DIAGNOSIS — I2510 Atherosclerotic heart disease of native coronary artery without angina pectoris: Secondary | ICD-10-CM

## 2012-06-17 HISTORY — PX: PERCUTANEOUS CORONARY STENT INTERVENTION (PCI-S): SHX5485

## 2012-06-17 LAB — CBC
HCT: 43.4 % (ref 39.0–52.0)
HCT: 44.4 % (ref 39.0–52.0)
Hemoglobin: 14.7 g/dL (ref 13.0–17.0)
Hemoglobin: 15.2 g/dL (ref 13.0–17.0)
Hemoglobin: 13.8 g/dL (ref 13.0–17.0)
MCH: 32.2 pg (ref 26.0–34.0)
MCH: 32.6 pg (ref 26.0–34.0)
MCHC: 34.2 g/dL (ref 30.0–36.0)
MCHC: 34.9 g/dL (ref 30.0–36.0)
MCV: 94.6 fL (ref 78.0–100.0)
MCV: 94.1 fL (ref 78.0–100.0)
Platelets: 188 10*3/uL (ref 150–400)
RBC: 4.59 MIL/uL (ref 4.22–5.81)
RBC: 4.72 MIL/uL (ref 4.22–5.81)
WBC: 6.3 10*3/uL (ref 4.0–10.5)

## 2012-06-17 LAB — BASIC METABOLIC PANEL
CO2: 26 mEq/L (ref 19–32)
Chloride: 105 mEq/L (ref 96–112)
Creatinine, Ser: 0.95 mg/dL (ref 0.50–1.35)
Potassium: 4.5 mEq/L (ref 3.5–5.1)
Sodium: 139 mEq/L (ref 135–145)

## 2012-06-17 LAB — POCT ACTIVATED CLOTTING TIME
Activated Clotting Time: 176 seconds
Activated Clotting Time: 290 seconds
Activated Clotting Time: 448 seconds
Activated Clotting Time: 154 seconds

## 2012-06-17 LAB — POCT I-STAT 3, ART BLOOD GAS (G3+)
Acid-base deficit: 1 mmol/L (ref 0.0–2.0)
O2 Saturation: 95 %
Patient temperature: 98
TCO2: 24 mmol/L (ref 0–100)

## 2012-06-17 SURGERY — PERCUTANEOUS CORONARY STENT INTERVENTION (PCI-S)
Anesthesia: LOCAL

## 2012-06-17 MED ORDER — STUDY - INVESTIGATIONAL DRUG SIMPLE RECORD
0.7500 mg/kg | Freq: Once | Status: DC
  Filled 2012-06-17: qty 77.92

## 2012-06-17 MED ORDER — HEPARIN SODIUM (PORCINE) 1000 UNIT/ML IJ SOLN
INTRAMUSCULAR | Status: AC
  Filled 2012-06-17: qty 1

## 2012-06-17 MED ORDER — EPTIFIBATIDE 75 MG/100ML IV SOLN
2.0000 ug/kg/min | INTRAVENOUS | Status: AC
  Administered 2012-06-17: 2 ug/kg/min via INTRAVENOUS
  Filled 2012-06-17 (×2): qty 100

## 2012-06-17 MED ORDER — ACETAMINOPHEN 325 MG PO TABS: 650.0000 mg | ORAL_TABLET | ORAL | Status: DC | PRN

## 2012-06-17 MED ORDER — EPTIFIBATIDE 75 MG/100ML IV SOLN
2.0000 ug/kg/min | INTRAVENOUS | Status: DC
  Administered 2012-06-18: 2 ug/kg/min via INTRAVENOUS
  Filled 2012-06-17: qty 100

## 2012-06-17 MED ORDER — DIAZEPAM 5 MG PO TABS
5.0000 mg | ORAL_TABLET | ORAL | Status: AC
  Administered 2012-06-17: 5 mg via ORAL
  Filled 2012-06-17: qty 1

## 2012-06-17 MED ORDER — EPTIFIBATIDE 75 MG/100ML IV SOLN
INTRAVENOUS | Status: AC
  Filled 2012-06-17: qty 100

## 2012-06-17 MED ORDER — SODIUM CHLORIDE 0.9 % IV SOLN: INTRAVENOUS | Status: DC

## 2012-06-17 MED ORDER — ONDANSETRON HCL 4 MG/2ML IJ SOLN: 4.0000 mg | Freq: Four times a day (QID) | INTRAMUSCULAR | Status: DC | PRN

## 2012-06-17 MED ORDER — MIDAZOLAM HCL 2 MG/2ML IJ SOLN
INTRAMUSCULAR | Status: AC
  Filled 2012-06-17: qty 2

## 2012-06-17 MED ORDER — LIDOCAINE HCL (PF) 1 % IJ SOLN
INTRAMUSCULAR | Status: AC
  Filled 2012-06-17: qty 30

## 2012-06-17 MED ORDER — ASPIRIN 81 MG PO CHEW
324.0000 mg | CHEWABLE_TABLET | ORAL | Status: AC
  Administered 2012-06-17: 324 mg via ORAL
  Filled 2012-06-17: qty 4

## 2012-06-17 MED ORDER — STUDY - INVESTIGATIONAL DRUG SIMPLE RECORD
1.7500 mg/kg/h | Freq: Once | Status: DC
  Filled 2012-06-17: qty 250

## 2012-06-17 MED ORDER — SODIUM CHLORIDE 0.9 % IV SOLN
INTRAVENOUS | Status: AC
  Administered 2012-06-17: 12:00:00 via INTRAVENOUS

## 2012-06-17 MED ORDER — FENTANYL CITRATE 0.05 MG/ML IJ SOLN
INTRAMUSCULAR | Status: AC
  Filled 2012-06-17: qty 2

## 2012-06-17 MED ORDER — HEPARIN (PORCINE) IN NACL 2-0.9 UNIT/ML-% IJ SOLN
INTRAMUSCULAR | Status: AC
  Filled 2012-06-17: qty 1000

## 2012-06-17 MED ORDER — HYDROCODONE-ACETAMINOPHEN 5-325 MG PO TABS: 1.0000 | ORAL_TABLET | Freq: Four times a day (QID) | ORAL | Status: DC | PRN

## 2012-06-17 MED ORDER — MORPHINE SULFATE 2 MG/ML IJ SOLN: 2.0000 mg | INTRAMUSCULAR | Status: DC | PRN

## 2012-06-17 MED ORDER — BIVALIRUDIN 250 MG IV SOLR
INTRAVENOUS | Status: AC
  Filled 2012-06-17: qty 250

## 2012-06-17 MED ORDER — ATROPINE SULFATE 1 MG/ML IJ SOLN
INTRAMUSCULAR | Status: AC
  Filled 2012-06-17: qty 1

## 2012-06-17 NOTE — Progress Notes (Signed)
    SUBJECTIVE: No chest pain or SOB.   BP 127/81  Pulse 66  Temp(Src) 97.9 F (36.6 C) (Oral)  Resp 20  Ht 5' 11" (1.803 m)  Wt 229 lb (103.874 kg)  BMI 31.95 kg/m2  SpO2 98%  Intake/Output Summary (Last 24 hours) at 06/17/12 0642 Last data filed at 06/17/12 0600  Gross per 24 hour  Intake 1567.98 ml  Output   1603 ml  Net -35.02 ml    PHYSICAL EXAM General: Well developed, well nourished, in no acute distress. Alert and oriented x 3.  Psych:  Good affect, responds appropriately Neck: No JVD. No masses noted.  Lungs: Clear bilaterally with no wheezes or rhonci noted.  Heart: RRR with no murmurs noted. Abdomen: Bowel sounds are present. Soft, non-tender.  Extremities: No lower extremity edema.   LABS: Basic Metabolic Panel:  Recent Labs  06/15/12 1428 06/16/12 0940 06/17/12 0510  NA 139 137 139  K 4.4 4.1 4.5  CL 103 103 105  CO2 26 22 26  GLUCOSE 124* 119* 111*  BUN 10 10 11  CREATININE 0.83 0.93 0.95  CALCIUM 8.8 9.0 8.7  MG 1.7  --   --    CBC:  Recent Labs  06/15/12 1428 06/17/12 0510  WBC 6.6 6.3  NEUTROABS 3.1  --   HGB 14.7 14.7  HCT 42.4 43.4  MCV 93.4 94.6  PLT 197 193   Cardiac Enzymes:  Recent Labs  06/15/12 1427 06/15/12 2021 06/16/12 0044  TROPONINI <0.30 <0.30 <0.30   Fasting Lipid Panel:  Recent Labs  06/16/12 0940  CHOL 149  HDL 22*  LDLCALC 79  TRIG 239*  CHOLHDL 6.8    Current Meds: . aspirin EC  81 mg Oral Daily  . atenolol  50 mg Oral Daily  . atorvastatin  40 mg Oral q1800  . clopidogrel  75 mg Oral Q breakfast  . diazepam  5 mg Oral On Call  . sertraline  100 mg Oral Daily  . sodium chloride  3 mL Intravenous Q12H     ASSESSMENT AND PLAN:  1. CAD/Unstable angina: Pt admitted following cardiac cath 06/15/12. He was found to have a severe ostial RCA stenosis as well as severe disease in a small to moderate caliber diagonal branch, small caliber OM1/OM2 branches. I have reviewed his films with my  colleagues and I think PCI is the right approach. He has been loaded with Plavix. Will continue ASA, Plavix, statin, beta blocker. Continue heparin drip. Plan PCI of RCA and likely diagonal branch this am.  R/B reviewed.     MCALHANY,CHRISTOPHER  5/23/20146:42 AM  

## 2012-06-17 NOTE — CV Procedure (Addendum)
   Cardiac Catheterization Operative Report  Eddie Price [id number] 5/23/201411:18 AM No primary provider on file.  Procedure Performed:  1. PTCA/DES x 1 ostial RCA 2. Aspiration thrombectomy of the RCA   Operator: Verne Carrow, MD  Indication:  72 yo male with history of  HTN with recent unstable angina, outpatient cath last week with severe ostial RCA stenosis in large dominant RCA. Pt admitted and PCI planned for today.                                      Procedure Details: The risks, benefits, complications, treatment options, and expected outcomes were discussed with the patient. The patient and/or family concurred with the proposed plan, giving informed consent. The patient was brought to the cath lab after IV hydration was begun and oral premedication was given. The patient was further sedated with Versed and Fentanyl. The right groin was prepped and draped in the usual manner. Using the modified Seldinger access technique, a 6 French sheath was placed in the right femoral artery. The RCA was engaged with a JR4 guiding catheter. He was given a bolus of Angiomax (REGULATE study stock) and a drip was started. The ACT was initially 176 so we rechecked and f/u ACT was 290. I then passed a BMW wire down the RCA and used a 2.5 x 12 mm balloon to pre-dilate the ostial stenosis. Recheck ACT was 190. The Angiomax was remixed and rebolused. At this time, I was concerned that our Angiomax was not effective so he was given a 4000 unit IV bolus of heparin. F/U ACT was over 400. A 3.0 x 12 mm Promus Premier DES was carefully positioned at the ostium of the RCA and the stent was deployed back to the ostium of the vessel. The stent was post-dilated with a 3.5 x 8 mm  balloon x 2. Follow up angiography demonstrated an excellent result at the stent site but in the mid vessel there was a filling defect on the wire which was felt to possibly represent a thrombus. I then used a Pronto aspiration  catheter to make 2 passes down the RCA. He was given a double bolus of Integrilin and a drip was started. Follow up ACT was 448. Final angiography demonstrated an excellent result in the proximal stented segment with no filling defects seen in the mid or distal vessel. The distal branches were patent.    There were no immediate complications. The patient was taken to the recovery area in stable condition.   Hemodynamic Findings: Central aortic pressure: 122/62  Impression: 1. Successful PTCA/DES x 1 ostium of large dominant RCA. 2. Residual disease in the small caliber diagonal branch and small caliber OM branches.   Recommendations: Will continue ASA and Plavix for one year. Will continue beta blocker and statin. If he has recurrent angina, could consider balloon angioplasty of the small caliber OM branches and small caliber diagonal.        Complications:  None; patient tolerated the procedure well.

## 2012-06-17 NOTE — Progress Notes (Signed)
94fr arterial sheath pulled by T.Hunter-RCIS at 15:25.  Manual pressure held.  Patient vagel after 5 minutes of hold.  At the same time a hematoma was noted medial to the sheath insertion site.  Manual pressure was held to compress.  RN made aware.  Continued to hold pressure undtil 16:10.  Patient states he is tender to touch.  Area was marked by T.Hunter.  A bandage was applied.  Raelyn Mora assessed sight.  Instructions given by Jill-RN

## 2012-06-17 NOTE — Progress Notes (Signed)
   S: CTSP 2/2 R groin hematoma that began developing prior to sheath pull.  Cath lab team has since pulled sheath and reduced hematoma significantly.  He still has tenderness.  No flank pain. O:   Filed Vitals:   06/17/12 1315  BP: 122/68  Pulse: 58  Temp:   Resp: 13  Pleasant, nad, aaox3.  R groin tender to palpation with palpable ridge of hematoma above femoral stick site.  No ecchymosis, bleeding, or bruit.  No flank tenderness.  A/P: 1.  R groin hematoma:  S/p manual compression.  Groin currently looks good, though he remains tender.  Will check CBC.  Treat pain with prn narcotics.  Advised bedrest and pt is cooperative.    Nicolasa Ducking, NP

## 2012-06-17 NOTE — H&P (View-Only) (Signed)
    SUBJECTIVE: No chest pain or SOB.   BP 127/81  Pulse 66  Temp(Src) 97.9 F (36.6 C) (Oral)  Resp 20  Ht 5\' 11"  (1.803 m)  Wt 229 lb (103.874 kg)  BMI 31.95 kg/m2  SpO2 98%  Intake/Output Summary (Last 24 hours) at 06/17/12 9604 Last data filed at 06/17/12 0600  Gross per 24 hour  Intake 1567.98 ml  Output   1603 ml  Net -35.02 ml    PHYSICAL EXAM General: Well developed, well nourished, in no acute distress. Alert and oriented x 3.  Psych:  Good affect, responds appropriately Neck: No JVD. No masses noted.  Lungs: Clear bilaterally with no wheezes or rhonci noted.  Heart: RRR with no murmurs noted. Abdomen: Bowel sounds are present. Soft, non-tender.  Extremities: No lower extremity edema.   LABS: Basic Metabolic Panel:  Recent Labs  54/09/81 1428 06/16/12 0940 06/17/12 0510  NA 139 137 139  K 4.4 4.1 4.5  CL 103 103 105  CO2 26 22 26   GLUCOSE 124* 119* 111*  BUN 10 10 11   CREATININE 0.83 0.93 0.95  CALCIUM 8.8 9.0 8.7  MG 1.7  --   --    CBC:  Recent Labs  06/15/12 1428 06/17/12 0510  WBC 6.6 6.3  NEUTROABS 3.1  --   HGB 14.7 14.7  HCT 42.4 43.4  MCV 93.4 94.6  PLT 197 193   Cardiac Enzymes:  Recent Labs  06/15/12 1427 06/15/12 2021 06/16/12 0044  TROPONINI <0.30 <0.30 <0.30   Fasting Lipid Panel:  Recent Labs  06/16/12 0940  CHOL 149  HDL 22*  LDLCALC 79  TRIG 191*  CHOLHDL 6.8    Current Meds: . aspirin EC  81 mg Oral Daily  . atenolol  50 mg Oral Daily  . atorvastatin  40 mg Oral q1800  . clopidogrel  75 mg Oral Q breakfast  . diazepam  5 mg Oral On Call  . sertraline  100 mg Oral Daily  . sodium chloride  3 mL Intravenous Q12H     ASSESSMENT AND PLAN:  1. CAD/Unstable angina: Pt admitted following cardiac cath 06/15/12. He was found to have a severe ostial RCA stenosis as well as severe disease in a small to moderate caliber diagonal branch, small caliber OM1/OM2 branches. I have reviewed his films with my  colleagues and I think PCI is the right approach. He has been loaded with Plavix. Will continue ASA, Plavix, statin, beta blocker. Continue heparin drip. Plan PCI of RCA and likely diagonal branch this am.  R/B reviewed.     MCALHANY,CHRISTOPHER  5/23/20146:42 AM

## 2012-06-17 NOTE — Research (Signed)
REGULATE PCI Informed Consent   Subject Name: Eddie Price  Subject met inclusion and exclusion criteria.  The informed consent form, study requirements and expectations were reviewed with the subject and questions and concerns were addressed prior to the signing of the consent form.  The subject verbalized understanding of the trail requirements.  The subject agreed to participate in the REGULATE PCI  trial and signed the informed consent.  The informed consent was obtained prior to performance of any protocol-specific procedures for the subject.  A copy of the signed informed consent was given to the subject and a copy was placed in the subject's medical record.  The ICF was signed at 1520 on 06/16/12.  Claire Shown 06/17/2012, 1:58 PM

## 2012-06-17 NOTE — Interval H&P Note (Signed)
History and Physical Interval Note:  06/17/2012 9:42 AM  Eddie Price  has presented today for PCI of the RCA and Diagonal with the diagnosis of cad.  The various methods of treatment have been discussed with the patient and family. After consideration of risks, benefits and other options for treatment, the patient has consented to  Procedure(s): PERCUTANEOUS CORONARY STENT INTERVENTION (PCI-S) (N/A) as a surgical intervention .  The patient's history has been reviewed, patient examined, no change in status, stable for surgery.  I have reviewed the patient's chart and labs.  Questions were answered to the patient's satisfaction.     MCALHANY,CHRISTOPHER

## 2012-06-18 ENCOUNTER — Encounter (HOSPITAL_COMMUNITY): Payer: Self-pay | Admitting: *Deleted

## 2012-06-18 LAB — BASIC METABOLIC PANEL
Calcium: 8.7 mg/dL (ref 8.4–10.5)
GFR calc Af Amer: >90 mL/min (ref >90)
GFR calc non Af Amer: 84 mL/min — ABNORMAL LOW (ref >90)
Glucose, Bld: 109 mg/dL — ABNORMAL HIGH (ref 70–99)
Potassium: 4.1 mEq/L (ref 3.5–5.1)
Sodium: 136 mEq/L (ref 135–145)

## 2012-06-18 LAB — CBC
MCH: 32.5 pg (ref 26.0–34.0)
MCHC: 34.8 g/dL (ref 30.0–36.0)
RDW: 13.1 % (ref 11.5–15.5)

## 2012-06-18 MED ORDER — NITROGLYCERIN 0.4 MG SL SUBL: 0.4000 mg | SUBLINGUAL_TABLET | SUBLINGUAL | Status: DC | PRN

## 2012-06-18 MED ORDER — CLOPIDOGREL BISULFATE 75 MG PO TABS: 75.0000 mg | ORAL_TABLET | Freq: Every day | ORAL | Status: DC

## 2012-06-18 MED ORDER — CLOPIDOGREL BISULFATE 75 MG PO TABS
75.0000 mg | ORAL_TABLET | Freq: Every day | ORAL | Status: DC
  Administered 2012-06-18: 75 mg via ORAL

## 2012-06-18 MED ORDER — ATORVASTATIN CALCIUM 40 MG PO TABS: 40.0000 mg | ORAL_TABLET | Freq: Every day | ORAL | Status: DC

## 2012-06-18 NOTE — Progress Notes (Signed)
Pt to d/c via w/c with RN.

## 2012-06-18 NOTE — Progress Notes (Signed)
Report from Night RN. Chart reviewed together. Handoff complete.Introductions complete. Will continue to monitor and advise attending as needed.   

## 2012-06-18 NOTE — Progress Notes (Signed)
CARDIAC REHAB PHASE I   PRE:  Rate/Rhythm: 86SR  BP:  Supine:   Sitting: 127/57  Standing:    SaO2:   MODE:  Ambulation: 700 ft   POST:  Rate/Rhythm: 94SR  BP:  Supine:   Sitting: 142/71  Standing:    SaO2:  0915-1015 Pt walked 700 ft with steady gait. Tolerated well. No CP. Education completed with pt and wife. Understanding voiced. Discussed CRP 2 and pt requested referral to [facility] program.    Luetta Nutting, RN BSN  06/18/2012 10:09 AM

## 2012-06-18 NOTE — Discharge Summary (Signed)
Physician Discharge Summary  Patient ID: Eddie Price MRN: [id number] DOB/AGE: [birth year] 72 y.o.  Admit date: 06/15/2012 Discharge date: 06/18/2012  Primary Discharge Diagnosis 1. CAD-S/P PCI of RCA  Secondary Discharge Diagnosis: 1. Hypercholesterolemia 2. Hypertension  Significant Diagnostic Studies: Cardiac Cath with PCI 06/17/2012 1. Successful PTCA/DES x 1 ostium of large dominant RCA.  2. Residual disease in the small caliber diagonal branch and small caliber OM branches.    Hospital Course: Mr. Eddie Price is a 72 y/o patient of Dr.Ross admitted after cardiac cath on 06/15/2012 where he was found to have severe ostial RCA stenosis as well as severe disease in a small to moderate caliber diagonal branch, small caliber OM1/OM2 branches. He was loaded with Plavix and brought back for staged intervention of the RCA.This was completed by Dr. Clifton James on 06/17/2012 using a 3.0 x 12 mm Promus Premier DES. He was continued on Plavix and ASA and recommended for ongoing DAPT for one year.  Post procedure he had groin bleed and hematoma prior to shealth being pulled. Mild tenderness and ecchymosis remained. He was able to get up and walk with cardiac rehab the following morning. He was seen by Dr. Antoine Poche and was found to be stable to return home. He will follow up in our office in 2 weeks, for ongoing assessment and management. Rx for Plavix and NTG are provided.  Discharge Exam: Blood pressure 116/75, pulse 81, temperature 99.1 F (37.3 C), temperature source Oral, resp. rate 18, height 5\' 9"  (1.753 m), weight 224 lb 10.4 oz (101.9 kg), SpO2 98.00%.  Labs:   Lab Results  Component Value Date   WBC 8.0 06/18/2012   HGB 13.8 06/18/2012   HCT 39.6 06/18/2012   MCV 93.4 06/18/2012   PLT 193 06/18/2012    Recent Labs Lab 06/15/12 1428  06/18/12 0610  NA 139  < > 136  K 4.4  < > 4.1  CL 103  < > 103  CO2 26  < > 21  BUN 10  < > 11  CREATININE 0.83  < > 0.92  CALCIUM 8.8  < > 8.7  PROT  6.5  --   --   BILITOT 0.5  --   --   ALKPHOS 42  --   --   ALT 15  --   --   AST 18  --   --   GLUCOSE 124*  < > 109*  < > = values in this interval not displayed. Lab Results  Component Value Date   TROPONINI <0.30 06/16/2012    Lab Results  Component Value Date   CHOL 149 06/16/2012   Lab Results  Component Value Date   HDL 22* 06/16/2012   Lab Results  Component Value Date   LDLCALC 79 06/16/2012   Lab Results  Component Value Date   TRIG 239* 06/16/2012   Lab Results  Component Value Date   CHOLHDL 6.8 06/16/2012   No results found for this basename: LDLDIRECT        FOLLOW UP PLANS AND APPOINTMENTS Discharge Orders   Future Orders Complete By Expires     Amb Referral to Cardiac Rehabilitation  As directed     Diet - low sodium heart healthy  As directed     Increase activity slowly  As directed         Medication List    TAKE these medications       aspirin 81 MG tablet  Take 81 mg by mouth  daily.     atenolol 50 MG tablet  Commonly known as:  TENORMIN  Take 50 mg by mouth daily.     atorvastatin 40 MG tablet  Commonly known as:  LIPITOR  Take 1 tablet (40 mg total) by mouth daily at 6 PM.     clopidogrel 75 MG tablet  Commonly known as:  PLAVIX  Take 1 tablet (75 mg total) by mouth daily with breakfast.     fish oil-omega-3 fatty acids 1000 MG capsule  Take 2 g by mouth daily.     multivitamin capsule  Take 1 capsule by mouth daily.     nitroGLYCERIN 0.4 MG SL tablet  Commonly known as:  NITROSTAT  Place 1 tablet (0.4 mg total) under the tongue every 5 (five) minutes x 3 doses as needed for chest pain.     sertraline 100 MG tablet  Commonly known as:  ZOLOFT  Take 50 mg by mouth daily.     sildenafil 100 MG tablet  Commonly known as:  VIAGRA  Take 100 mg by mouth daily as needed for erectile dysfunction.     vitamin E 400 UNIT capsule  Generic drug:  vitamin E  Take 800 Units by mouth daily.           Follow-up Information    Follow up with Dietrich Pates, MD. (Our office will call you for appt, with Dr. Tenny Craw or PA)    Contact information:   [address] [place] [address] [phone]         Time spent with patient to include physician time:40 minutes. Signed: Joni Reining 06/18/2012, 2:25 PM Co-Sign MD

## 2012-06-18 NOTE — Progress Notes (Signed)
SUBJECTIVE:  Status post PCI yesterday with groin bleed.     PHYSICAL EXAM Filed Vitals:   06/17/12 2100 06/17/12 2200 06/17/12 2355 06/18/12 0535  BP: 118/64 122/65 121/61 114/61  Pulse: 74 86 81   Temp:   97.7 F (36.5 C) 98.4 F (36.9 C)  TempSrc:   Oral Oral  Resp: 15 20 18    Height:      Weight:      SpO2: 97% 99% 97% 97%   General:  No distress Lungs:  Clear Heart: RRR Abdomen:  Positive bowel sounds, no rebound no guarding Extremities:  Right groin with slight hematoma, soft.  Moderate echymosis.  LABS: Lab Results  Component Value Date   TROPONINI <0.30 06/16/2012   Results for orders placed during the Price encounter of 06/15/12 (from the past 24 hour(s))  CBC     Status: None   Collection Time    06/17/12  8:00 AM      Result Value Range   WBC 6.4  4.0 - 10.5 K/uL   RBC 4.72  4.22 - 5.81 MIL/uL   Hemoglobin 15.2  13.0 - 17.0 g/dL   HCT 16.1  09.6 - 04.5 %   MCV 94.1  78.0 - 100.0 fL   MCH 32.2  26.0 - 34.0 pg   MCHC 34.2  30.0 - 36.0 g/dL   RDW 40.9  81.1 - 91.4 %   Platelets 204  150 - 400 K/uL  HEPARIN LEVEL (UNFRACTIONATED)     Status: None   Collection Time    06/17/12  8:00 AM      Result Value Range   Heparin Unfractionated 0.65  0.30 - 0.70 IU/mL  POCT ACTIVATED CLOTTING TIME     Status: None   Collection Time    06/17/12 10:08 AM      Result Value Range   Activated Clotting Time 170    POCT ACTIVATED CLOTTING TIME     Status: None   Collection Time    06/17/12 10:27 AM      Result Value Range   Activated Clotting Time 176    POCT ACTIVATED CLOTTING TIME     Status: None   Collection Time    06/17/12 10:33 AM      Result Value Range   Activated Clotting Time 290    POCT ACTIVATED CLOTTING TIME     Status: None   Collection Time    06/17/12 10:42 AM      Result Value Range   Activated Clotting Time 170    POCT ACTIVATED CLOTTING TIME     Status: None   Collection Time    06/17/12 10:52 AM      Result Value Range   Activated  Clotting Time 192    POCT ACTIVATED CLOTTING TIME     Status: None   Collection Time    06/17/12 11:05 AM      Result Value Range   Activated Clotting Time 448    POCT I-STAT 3, BLOOD GAS (G3+)     Status: Abnormal   Collection Time    06/17/12  2:44 PM      Result Value Range   pH, Arterial 7.422  7.350 - 7.450   pCO2 arterial 34.5 (*) 35.0 - 45.0 mmHg   pO2, Arterial 74.0 (*) 80.0 - 100.0 mmHg   Bicarbonate 22.5  20.0 - 24.0 mEq/L   TCO2 24  0 - 100 mmol/L   O2 Saturation 95.0  Acid-base deficit 1.0  0.0 - 2.0 mmol/L   Patient temperature 98.0 F     Sample type ARTERIAL    POCT ACTIVATED CLOTTING TIME     Status: None   Collection Time    06/17/12  2:57 PM      Result Value Range   Activated Clotting Time 154    CBC     Status: None   Collection Time    06/17/12  7:00 PM      Result Value Range   WBC 10.2  4.0 - 10.5 K/uL   RBC 4.23  4.22 - 5.81 MIL/uL   Hemoglobin 13.8  13.0 - 17.0 g/dL   HCT 30.8  65.7 - 84.6 %   MCV 93.4  78.0 - 100.0 fL   MCH 32.6  26.0 - 34.0 pg   MCHC 34.9  30.0 - 36.0 g/dL   RDW 96.2  95.2 - 84.1 %   Platelets 188  150 - 400 K/uL  CBC     Status: None   Collection Time    06/18/12  6:10 AM      Result Value Range   WBC 8.0  4.0 - 10.5 K/uL   RBC 4.24  4.22 - 5.81 MIL/uL   Hemoglobin 13.8  13.0 - 17.0 g/dL   HCT 32.4  40.1 - 02.7 %   MCV 93.4  78.0 - 100.0 fL   MCH 32.5  26.0 - 34.0 pg   MCHC 34.8  30.0 - 36.0 g/dL   RDW 25.3  66.4 - 40.3 %   Platelets 193  150 - 400 K/uL    Intake/Output Summary (Last 24 hours) at 06/18/12 0748 Last data filed at 06/18/12 0630  Gross per 24 hour  Intake 1048.8 ml  Output   1225 ml  Net -176.2 ml    ASSESSMENT AND PLAN:  CAD:  OK to go home later today if groin stable.   Meds as on MAR.    Dyslipidemia:  Continue current statin.   Eddie Price Eddie Price 06/18/2012 7:48 AM

## 2012-06-21 MED FILL — Sodium Chloride IV Soln 0.9%: INTRAVENOUS | Qty: 50 | Status: AC

## 2012-06-22 ENCOUNTER — Emergency Department (HOSPITAL_COMMUNITY): Payer: Medicare Other

## 2012-06-22 ENCOUNTER — Emergency Department (HOSPITAL_COMMUNITY)
Admission: EM | Admit: 2012-06-22 | Discharge: 2012-06-23 | Disposition: A | Discharge status: Home / Self Care | Payer: Medicare Other | Attending: Emergency Medicine | Admitting: Emergency Medicine

## 2012-06-22 ENCOUNTER — Telehealth: Payer: Self-pay | Admitting: *Deleted

## 2012-06-22 ENCOUNTER — Encounter (HOSPITAL_COMMUNITY): Payer: Self-pay | Admitting: *Deleted

## 2012-06-22 DIAGNOSIS — Z8719 Personal history of other diseases of the digestive system: Secondary | ICD-10-CM | POA: Insufficient documentation

## 2012-06-22 DIAGNOSIS — Z7982 Long term (current) use of aspirin: Secondary | ICD-10-CM | POA: Insufficient documentation

## 2012-06-22 DIAGNOSIS — T148XXA Other injury of unspecified body region, initial encounter: Secondary | ICD-10-CM

## 2012-06-22 DIAGNOSIS — Z87891 Personal history of nicotine dependence: Secondary | ICD-10-CM | POA: Insufficient documentation

## 2012-06-22 DIAGNOSIS — IMO0002 Reserved for concepts with insufficient information to code with codable children: Secondary | ICD-10-CM | POA: Insufficient documentation

## 2012-06-22 DIAGNOSIS — G8918 Other acute postprocedural pain: Secondary | ICD-10-CM | POA: Insufficient documentation

## 2012-06-22 DIAGNOSIS — I10 Essential (primary) hypertension: Secondary | ICD-10-CM | POA: Insufficient documentation

## 2012-06-22 DIAGNOSIS — Y840 Cardiac catheterization as the cause of abnormal reaction of the patient, or of later complication, without mention of misadventure at the time of the procedure: Secondary | ICD-10-CM | POA: Insufficient documentation

## 2012-06-22 DIAGNOSIS — Z7902 Long term (current) use of antithrombotics/antiplatelets: Secondary | ICD-10-CM | POA: Insufficient documentation

## 2012-06-22 DIAGNOSIS — Y838 Other surgical procedures as the cause of abnormal reaction of the patient, or of later complication, without mention of misadventure at the time of the procedure: Secondary | ICD-10-CM | POA: Insufficient documentation

## 2012-06-22 DIAGNOSIS — I209 Angina pectoris, unspecified: Secondary | ICD-10-CM | POA: Insufficient documentation

## 2012-06-22 DIAGNOSIS — Z79899 Other long term (current) drug therapy: Secondary | ICD-10-CM | POA: Insufficient documentation

## 2012-06-22 DIAGNOSIS — I499 Cardiac arrhythmia, unspecified: Secondary | ICD-10-CM | POA: Insufficient documentation

## 2012-06-22 DIAGNOSIS — Z9861 Coronary angioplasty status: Secondary | ICD-10-CM | POA: Insufficient documentation

## 2012-06-22 MED ORDER — HYDROCODONE-ACETAMINOPHEN 5-325 MG PO TABS: 1.0000 | ORAL_TABLET | Freq: Four times a day (QID) | ORAL | Status: DC | PRN

## 2012-06-22 MED ORDER — HYDROCODONE-ACETAMINOPHEN 5-325 MG PO TABS
1.0000 | ORAL_TABLET | Freq: Once | ORAL | Status: AC
  Administered 2012-06-23: 1 via ORAL
  Filled 2012-06-22: qty 1

## 2012-06-22 NOTE — Telephone Encounter (Signed)
Noted pt is currently in ED as advised, will check to see if pt apt with KL is to be kept per discharge instructions

## 2012-06-22 NOTE — ED Notes (Signed)
Pt had cardiac cath on Friday. Burning to groin area. States area is black and blue.

## 2012-06-22 NOTE — ED Notes (Signed)
Pt c/o burning and tingling to the rt groin since being discharged from the hospital. Pt's cardiologist wanted him to come here for ultrasound of the area.

## 2012-06-22 NOTE — Telephone Encounter (Signed)
PT HAS CATH DONE / HIS GROIN IS STILL SWOLLEN AND HE IS IN PAIN NOW WHEN HE WALKS. PT STATES THAT IT FEELS FEVERISH BUT THINKS IT WAS THAT WAY WHEN HE WAS DISCHARGED FOR HOSPITAL. I HAVE MADE HIM APPT FOR 06/23/12 WITH KATHRYN AT 11:40 BUT WAS UNSURE IF HE SHOULD GO TO HOSPITAL INSTEAD.

## 2012-06-22 NOTE — Telephone Encounter (Signed)
Pt also notes the pain while he is sitting down as well, pt also notes the swelling and purple to black in color, on lower right hand side of his groin that feels raw about the size of his hand,pt also notes a hardened spot on the right hand side of his groin, pt has not taken his temperature via mouth, has not checked BP, pt denies chest pain, SOB, no swelling in any other parts of his body, cath noted 06-17-12, pt has not used ice or heating pad to assist, pt noted the sxs worsened early Sunday morning, but noted to bother him at the hospital, this nurse contacted MD Tenny Craw and per current sxs pt will need an [place] to check his leg to make sure everything is ok and will not need to wait until apt with KL tomorrow to be addressed, called pt to advise he needs ED evaluation, pt noted he will have someone drive him to [facility] now and they will review his chart for information needed

## 2012-06-22 NOTE — ED Provider Notes (Signed)
History    Scribed for Shelda Jakes, MD, the patient was seen in room APA07/APA07. This chart was scribed by Lewanda Rife, ED scribe. Patient's care was started at 2210    CSN: [id number]  Arrival date & time 06/22/12  1713   First MD Initiated Contact with Patient 06/22/12 2118      Chief Complaint  Patient presents with  . Post-op Problem    (Consider location/radiation/quality/duration/timing/severity/associated sxs/prior treatment) The history is provided by the patient.   HPI Comments: NICCOLO BURGGRAF is a 72 y.o. male who presents to the Emergency Department complaining of post-op problem onset 6 days. Reports cardiac catheterization 6 days ago with 1 stent placement. Reports associated constant worsening burning, bruising, and pain to touch over bruising in suprapubic area, right lower quadrant, and left lower quadrant. Reports tenderness over RLQ is greater than LLQ. Reports symptoms are aggravated by touch and alleviated by nothing. Denies chest pain, shortness of breath, dysuria, hematuria, paraesthesias, headaches, visual disturbances, sore throat, rhinorrhea, diarrhea, nausea, emesis, cough, and leg swelling. Reports taking Plavix and bruising easily. [place] Dr. Tenny Craw cardiologist. Dr. Tenny Craw recommends ultra sound of groin.   Past Medical History  Diagnosis Date  . Anginal pain   . Hypertension   . Dysrhythmia   . Shortness of breath   . H/O hiatal hernia     Past Surgical History  Procedure Laterality Date  . Complete colonoscopy  07-2010  . Knee surgery Right   . Choecystectomy    . Shoulder surgery Bilateral   . Total hip arthroplasty    . Tonsillectomy    . Joint replacement    . Cholecystectomy    . Hernia repair    . Cardiac catheterization    . Fracture surgery      Family History  Problem Relation Age of Onset  . Heart failure Mother   . Heart failure Father   . Stroke Father   . Heart attack Father     History  Substance Use Topics   . Smoking status: Former Smoker    Quit date: 06/15/1968  . Smokeless tobacco: Not on file  . Alcohol Use: Not on file      Review of Systems  Constitutional: Negative for fever.  HENT: Negative for sore throat and rhinorrhea.   Respiratory: Negative for cough and shortness of breath.   Cardiovascular: Negative for chest pain and leg swelling.  Gastrointestinal: Positive for abdominal pain. Negative for nausea and vomiting.  Genitourinary: Negative for dysuria.  Musculoskeletal: Negative for back pain.  Skin: Positive for color change (bruising ).  Neurological: Negative for numbness.  Hematological: Bruises/bleeds easily.  Psychiatric/Behavioral: Negative for confusion.   A complete 10 system review of systems was obtained and all systems are negative except as noted in the HPI and PMH.    Allergies  Sulfa antibiotics  Home Medications   Current Outpatient Rx  Name  Route  Sig  Dispense  Refill  . aspirin 81 MG tablet   Oral   Take 81 mg by mouth daily.         Marland Kitchen atenolol (TENORMIN) 50 MG tablet   Oral   Take 50 mg by mouth daily.         Marland Kitchen atorvastatin (LIPITOR) 40 MG tablet   Oral   Take 1 tablet (40 mg total) by mouth daily at 6 PM.   30 tablet   11   . clopidogrel (PLAVIX) 75 MG tablet   Oral  Take 1 tablet (75 mg total) by mouth daily with breakfast.   30 tablet   11   . fish oil-omega-3 fatty acids 1000 MG capsule   Oral   Take 2 g by mouth daily.         . Multiple Vitamin (MULTIVITAMIN) capsule   Oral   Take 1 capsule by mouth daily.         . nitroGLYCERIN (NITROSTAT) 0.4 MG SL tablet   Sublingual   Place 1 tablet (0.4 mg total) under the tongue every 5 (five) minutes x 3 doses as needed for chest pain.   30 tablet   12   . sertraline (ZOLOFT) 100 MG tablet   Oral   Take 50 mg by mouth daily.         . sildenafil (VIAGRA) 100 MG tablet   Oral   Take 100 mg by mouth daily as needed for erectile dysfunction.         .  vitamin E (VITAMIN E) 400 UNIT capsule   Oral   Take 800 Units by mouth daily.         Marland Kitchen HYDROcodone-acetaminophen (NORCO/VICODIN) 5-325 MG per tablet   Oral   Take 1-2 tablets by mouth every 6 (six) hours as needed for pain.   20 tablet   0     BP 107/57  Pulse 102  Temp(Src) 97.4 F (36.3 C) (Oral)  Resp 18  Ht 5\' 11"  (1.803 m)  Wt 220 lb (99.791 kg)  BMI 30.7 kg/m2  SpO2 95%  Physical Exam  Nursing note and vitals reviewed. Constitutional: He is oriented to person, place, and time. He appears well-developed and well-nourished. No distress.  HENT:  Head: Normocephalic and atraumatic.  Eyes: EOM are normal.  Neck: Neck supple. No tracheal deviation present.  Cardiovascular: Normal rate and regular rhythm.   Pulses:      Dorsalis pedis pulses are 2+ on the right side.       Posterior tibial pulses are 2+ on the right side.  Pulmonary/Chest: Effort normal and breath sounds normal. No respiratory distress. He has no wheezes.  Abdominal: Soft. Bowel sounds are normal. There is tenderness (more tenderness over RLQ than LLQ) in the right lower quadrant, suprapubic area and left lower quadrant.  Greatest tenderness over RLQ   Genitourinary:  No scrotal swelling  Musculoskeletal: Normal range of motion. He exhibits tenderness. He exhibits no edema.  Lower extremities, no edema.   Tenderness over bruised RLQ, and suprapubic region  Neurological: He is alert and oriented to person, place, and time. He has normal strength. No sensory deficit.  Cranial nerves II-XII intact   Skin: Skin is warm and dry. Bruising noted. No rash noted.     Bruising over suprapubic region, RLQ, scrotum, and penis with tenderness, but no swelling.   No bruising on right anterior upper thigh and no swelling or tenderness.      Psychiatric: He has a normal mood and affect. His behavior is normal.    ED Course  Procedures (including critical care time) Medications  HYDROcodone-acetaminophen  (NORCO/VICODIN) 5-325 MG per tablet 1 tablet (not administered)    Labs Reviewed - No data to display [place] Lower Ext Art Right Ltd  06/22/2012   *RADIOLOGY REPORT*  Clinical Data: Status post cardiac catheterization; question for pseudoaneurysm at the right inguinal region.  RIGHT LOWER EXTREMITY ARTERIAL DUPLEX EVAL  Technique:  Doppler ultrasound of the right inguinal region was performed.  Comparison: None.  Findings: The right common femoral artery is visualized and appears grossly unremarkable.  No definite patent pseudoaneurysm is seen. Soft tissue edema is noted at the site of procedure, overlying the artery and vein.  Visualized spectral waveforms within the right common femoral artery and vein are unremarkable.  IMPRESSION: No evidence of pseudoaneurysm.   Original Report Authenticated By: Tonia Ghent, M.D.   Results for orders placed during the hospital encounter of 06/15/12  TROPONIN I      Result Value Range   Troponin I <0.30  <0.30 ng/mL  TROPONIN I      Result Value Range   Troponin I <0.30  <0.30 ng/mL  TROPONIN I      Result Value Range   Troponin I <0.30  <0.30 ng/mL  COMPREHENSIVE METABOLIC PANEL      Result Value Range   Sodium 139  135 - 145 mEq/L   Potassium 4.4  3.5 - 5.1 mEq/L   Chloride 103  96 - 112 mEq/L   CO2 26  19 - 32 mEq/L   Glucose, Bld 124 (*) 70 - 99 mg/dL   BUN 10  6 - 23 mg/dL   Creatinine, Ser 8.11  0.50 - 1.35 mg/dL   Calcium 8.8  8.4 - 91.4 mg/dL   Total Protein 6.5  6.0 - 8.3 g/dL   Albumin 3.4 (*) 3.5 - 5.2 g/dL   AST 18  0 - 37 U/L   ALT 15  0 - 53 U/L   Alkaline Phosphatase 42  39 - 117 U/L   Total Bilirubin 0.5  0.3 - 1.2 mg/dL   GFR calc non Af Amer 88 (*) >90 mL/min   GFR calc Af Amer >90  >90 mL/min  CBC WITH DIFFERENTIAL      Result Value Range   WBC 6.6  4.0 - 10.5 K/uL   RBC 4.54  4.22 - 5.81 MIL/uL   Hemoglobin 14.7  13.0 - 17.0 g/dL   HCT 78.2  95.6 - 21.3 %   MCV 93.4  78.0 - 100.0 fL   MCH 32.4  26.0 - 34.0 pg   MCHC  34.7  30.0 - 36.0 g/dL   RDW 08.6  57.8 - 46.9 %   Platelets 197  150 - 400 K/uL   Neutrophils Relative % 46  43 - 77 %   Neutro Abs 3.1  1.7 - 7.7 K/uL   Lymphocytes Relative 42  12 - 46 %   Lymphs Abs 2.8  0.7 - 4.0 K/uL   Monocytes Relative 6  3 - 12 %   Monocytes Absolute 0.4  0.1 - 1.0 K/uL   Eosinophils Relative 5  0 - 5 %   Eosinophils Absolute 0.3  0.0 - 0.7 K/uL   Basophils Relative 1  0 - 1 %   Basophils Absolute 0.0  0.0 - 0.1 K/uL  MAGNESIUM      Result Value Range   Magnesium 1.7  1.5 - 2.5 mg/dL  HEPARIN LEVEL (UNFRACTIONATED)      Result Value Range   Heparin Unfractionated 0.18 (*) 0.30 - 0.70 IU/mL  BASIC METABOLIC PANEL      Result Value Range   Sodium 137  135 - 145 mEq/L   Potassium 4.1  3.5 - 5.1 mEq/L   Chloride 103  96 - 112 mEq/L   CO2 22  19 - 32 mEq/L   Glucose, Bld 119 (*) 70 - 99 mg/dL   BUN 10  6 - 23 mg/dL  Creatinine, Ser 0.93  0.50 - 1.35 mg/dL   Calcium 9.0  8.4 - 40.9 mg/dL   GFR calc non Af Amer 84 (*) >90 mL/min   GFR calc Af Amer >90  >90 mL/min  LIPID PANEL      Result Value Range   Cholesterol 149  0 - 200 mg/dL   Triglycerides 811 (*) <150 mg/dL   HDL 22 (*) >91 mg/dL   Total CHOL/HDL Ratio 6.8     VLDL 48 (*) 0 - 40 mg/dL   LDL Cholesterol 79  0 - 99 mg/dL  HEPARIN LEVEL (UNFRACTIONATED)      Result Value Range   Heparin Unfractionated 0.37  0.30 - 0.70 IU/mL  BASIC METABOLIC PANEL      Result Value Range   Sodium 139  135 - 145 mEq/L   Potassium 4.5  3.5 - 5.1 mEq/L   Chloride 105  96 - 112 mEq/L   CO2 26  19 - 32 mEq/L   Glucose, Bld 111 (*) 70 - 99 mg/dL   BUN 11  6 - 23 mg/dL   Creatinine, Ser 4.78  0.50 - 1.35 mg/dL   Calcium 8.7  8.4 - 29.5 mg/dL   GFR calc non Af Amer 83 (*) >90 mL/min   GFR calc Af Amer >90  >90 mL/min  CBC      Result Value Range   WBC 6.3  4.0 - 10.5 K/uL   RBC 4.59  4.22 - 5.81 MIL/uL   Hemoglobin 14.7  13.0 - 17.0 g/dL   HCT 62.1  30.8 - 65.7 %   MCV 94.6  78.0 - 100.0 fL   MCH 32.0   26.0 - 34.0 pg   MCHC 33.9  30.0 - 36.0 g/dL   RDW 84.6  96.2 - 95.2 %   Platelets 193  150 - 400 K/uL  CBC      Result Value Range   WBC 6.4  4.0 - 10.5 K/uL   RBC 4.72  4.22 - 5.81 MIL/uL   Hemoglobin 15.2  13.0 - 17.0 g/dL   HCT 84.1  32.4 - 40.1 %   MCV 94.1  78.0 - 100.0 fL   MCH 32.2  26.0 - 34.0 pg   MCHC 34.2  30.0 - 36.0 g/dL   RDW 02.7  25.3 - 66.4 %   Platelets 204  150 - 400 K/uL  HEPARIN LEVEL (UNFRACTIONATED)      Result Value Range   Heparin Unfractionated 0.65  0.30 - 0.70 IU/mL  CBC      Result Value Range   WBC 10.2  4.0 - 10.5 K/uL   RBC 4.23  4.22 - 5.81 MIL/uL   Hemoglobin 13.8  13.0 - 17.0 g/dL   HCT 40.3  47.4 - 25.9 %   MCV 93.4  78.0 - 100.0 fL   MCH 32.6  26.0 - 34.0 pg   MCHC 34.9  30.0 - 36.0 g/dL   RDW 56.3  87.5 - 64.3 %   Platelets 188  150 - 400 K/uL  CBC      Result Value Range   WBC 8.0  4.0 - 10.5 K/uL   RBC 4.24  4.22 - 5.81 MIL/uL   Hemoglobin 13.8  13.0 - 17.0 g/dL   HCT 32.9  51.8 - 84.1 %   MCV 93.4  78.0 - 100.0 fL   MCH 32.5  26.0 - 34.0 pg   MCHC 34.8  30.0 - 36.0 g/dL   RDW  13.1  11.5 - 15.5 %   Platelets 193  150 - 400 K/uL  BASIC METABOLIC PANEL      Result Value Range   Sodium 136  135 - 145 mEq/L   Potassium 4.1  3.5 - 5.1 mEq/L   Chloride 103  96 - 112 mEq/L   CO2 21  19 - 32 mEq/L   Glucose, Bld 109 (*) 70 - 99 mg/dL   BUN 11  6 - 23 mg/dL   Creatinine, Ser 1.61  0.50 - 1.35 mg/dL   Calcium 8.7  8.4 - 09.6 mg/dL   GFR calc non Af Amer 84 (*) >90 mL/min   GFR calc Af Amer >90  >90 mL/min  POCT I-STAT 3, BLOOD GAS (G3+)      Result Value Range   pH, Arterial 7.422  7.350 - 7.450   pCO2 arterial 34.5 (*) 35.0 - 45.0 mmHg   pO2, Arterial 74.0 (*) 80.0 - 100.0 mmHg   Bicarbonate 22.5  20.0 - 24.0 mEq/L   TCO2 24  0 - 100 mmol/L   O2 Saturation 95.0     Acid-base deficit 1.0  0.0 - 2.0 mmol/L   Patient temperature 98.0 F     Sample type ARTERIAL    POCT ACTIVATED CLOTTING TIME      Result Value Range    Activated Clotting Time 154    POCT ACTIVATED CLOTTING TIME      Result Value Range   Activated Clotting Time 170    POCT ACTIVATED CLOTTING TIME      Result Value Range   Activated Clotting Time 176    POCT ACTIVATED CLOTTING TIME      Result Value Range   Activated Clotting Time 290    POCT ACTIVATED CLOTTING TIME      Result Value Range   Activated Clotting Time 170    POCT ACTIVATED CLOTTING TIME      Result Value Range   Activated Clotting Time 192    POCT ACTIVATED CLOTTING TIME      Result Value Range   Activated Clotting Time 448       1. Subcutaneous hematoma       MDM  Ultrasound of the right groin without any evidence of a pseudoaneurysm. Patient's bruising and hematoma pattern is more in the subcutaneous area the right lower [place], suprapubic area and down into the scrotum. No significant swelling of scrotum. No ecchymosis or bruising around the right groin area. Patient's the distal pulses to the right leg dorsalis pedis and posterior tibial are 2+. Patient clinically without any evidence of intra-abdominal bleeding. Patient has followup with cardiology tomorrow. Considered in angios CT of the abdomen the patient clinically is doing very well not necessary. Also patient did not want to have it done.    I personally performed the services described in this documentation, which was scribed in my presence. The recorded information has been reviewed and is accurate.     Shelda Jakes, MD 06/22/12 [id number]

## 2012-06-23 ENCOUNTER — Encounter: Payer: Self-pay | Admitting: Adult Health

## 2012-06-23 ENCOUNTER — Ambulatory Visit (INDEPENDENT_AMBULATORY_CARE_PROVIDER_SITE_OTHER): Payer: Medicare Other | Admitting: Adult Health

## 2012-06-23 VITALS — BP 124/78 | HR 76 | Ht 71.0 in | Wt 227.0 lb

## 2012-06-23 DIAGNOSIS — I2510 Atherosclerotic heart disease of native coronary artery without angina pectoris: Secondary | ICD-10-CM

## 2012-06-23 DIAGNOSIS — I998 Other disorder of circulatory system: Secondary | ICD-10-CM

## 2012-06-23 DIAGNOSIS — R58 Hemorrhage, not elsewhere classified: Secondary | ICD-10-CM

## 2012-06-23 MED ORDER — TRAMADOL HCL 50 MG PO TABS: 50.0000 mg | ORAL_TABLET | Freq: Three times a day (TID) | ORAL | Status: DC | PRN

## 2012-06-23 NOTE — Progress Notes (Signed)
HPI: Eddie Price is a 72 year old patient of Dr. Huston Foley were following for ongoing assessment and management of chest discomfort. Patient was last in the office on 06/14/2012 with chest pain consistent with exertional angina. The patient was plan for cardiac catheterization. This was completed on 06/15/2012.      The patient was found to have a severe ostial right coronary stenosis in a large dominant RCA, with triple vessel CAD, and preserved LV systolic function. Coronary artery bypass grafting was not felt to be the best option for him. And therefore patient underwent PTCA with drug-eluting stent to the ostium of the right coronary artery. He was recommended for dual antiplatelet therapy for one year, with aspirin and Plavix. Procedure he had a right groin bleed and hematoma after the sheath was pulled. He had a large amount of ecchymosis at that time. He is here for hospital followup with ongoing assessment.   Was seen in ER for  Evaluation of psuedoanuerysm. Still complains of burning and pain in the testicular and groin area. He was given vicodin for pain.  Allergies  Allergen Reactions  . Sulfa Antibiotics     RASH    Current Outpatient Prescriptions  Medication Sig Dispense Refill  . aspirin 81 MG tablet Take 81 mg by mouth daily.      Marland Kitchen atenolol (TENORMIN) 50 MG tablet Take 50 mg by mouth daily.      Marland Kitchen atorvastatin (LIPITOR) 40 MG tablet Take 1 tablet (40 mg total) by mouth daily at 6 PM.  30 tablet  11  . clopidogrel (PLAVIX) 75 MG tablet Take 1 tablet (75 mg total) by mouth daily with breakfast.  30 tablet  11  . fish oil-omega-3 fatty acids 1000 MG capsule Take 2 g by mouth daily.      Marland Kitchen HYDROcodone-acetaminophen (NORCO/VICODIN) 5-325 MG per tablet Take 1-2 tablets by mouth every 6 (six) hours as needed for pain.  20 tablet  0  . Multiple Vitamin (MULTIVITAMIN) capsule Take 1 capsule by mouth daily.      . nitroGLYCERIN (NITROSTAT) 0.4 MG SL tablet Place 1 tablet (0.4 mg total)  under the tongue every 5 (five) minutes x 3 doses as needed for chest pain.  30 tablet  12  . sertraline (ZOLOFT) 100 MG tablet Take 50 mg by mouth daily.      . sildenafil (VIAGRA) 100 MG tablet Take 100 mg by mouth daily as needed for erectile dysfunction.      . vitamin E (VITAMIN E) 400 UNIT capsule Take 800 Units by mouth daily.       No current facility-administered medications for this visit.    Past Medical History  Diagnosis Date  . Anginal pain   . Hypertension   . Dysrhythmia   . Shortness of breath   . H/O hiatal hernia     Past Surgical History  Procedure Laterality Date  . Complete colonoscopy  07-2010  . Knee surgery Right   . Choecystectomy    . Shoulder surgery Bilateral   . Total hip arthroplasty    . Tonsillectomy    . Joint replacement    . Cholecystectomy    . Hernia repair    . Cardiac catheterization    . Fracture surgery      [id number] of systems complete and found to be negative unless listed above  PHYSICAL EXAM BP 124/78  Pulse 76  Ht 5\' 11"  (1.803 m)  Wt 227 lb (102.967 kg)  BMI 31.67  kg/m2  General: Well developed, well nourished, in no acute distress Head: Eyes PERRLA, No xanthomas.   Normal cephalic and atramatic  Lungs: Clear bilaterally to auscultation and percussion. Heart: HRRR S1 S2, without MRG.  Pulses are 2+ & equal.            No carotid bruit. No JVD.  No abdominal bruits. No femoral bruits. Abdomen: Bowel sounds are positive, abdomen soft and non-tender without masses or                  Hernia's noted. Msk:  Back normal, normal gait. Normal strength and tone for age.  Extremities: No clubbing, cyanosis or edema.  Large amount of ecchymosis, deep red, into the groin, testicles and penis. Pain with minimal movement. DP +1 Neuro: Alert and oriented X 3. Psych:  Good affect, responds appropriately  ASSESSMENT AND PLAN

## 2012-06-23 NOTE — Telephone Encounter (Signed)
Pt advised to keep f/u apt with [facility] NP scheduled for today, discharged from ED

## 2012-06-23 NOTE — Patient Instructions (Addendum)
You have been given a prescription for Tramadol you may take 1 tablet every 6-8 hours as needed   Keep your follow-up appointment with Dr. Tenny Craw as scheduled

## 2012-06-23 NOTE — Assessment & Plan Note (Signed)
Area is thoroughly examined with significant skin discoloration of the groin, penis and testicle. Equisitely painful with movement and palpations. Has been ruled out for psuedoanerym.   He has been placed on vicodin by ER. I will start him tramadol 50 mg every 6-8 hours around the clock for the next few days and then prn. He is to put ice/heat on the site for comfort.

## 2012-06-23 NOTE — Assessment & Plan Note (Signed)
He is doing well from CV standpoint. He denies recurrent chest pain or DOE. He will need to be referred to cardiac rehab after being seen again in one month. Continue DAPT

## 2012-07-01 ENCOUNTER — Ambulatory Visit (INDEPENDENT_AMBULATORY_CARE_PROVIDER_SITE_OTHER): Payer: Medicare Other | Admitting: Internal Medicine

## 2012-07-01 ENCOUNTER — Encounter: Payer: Self-pay | Admitting: Internal Medicine

## 2012-07-01 VITALS — BP 112/70 | HR 62 | Ht 71.0 in | Wt 221.0 lb

## 2012-07-01 DIAGNOSIS — I2510 Atherosclerotic heart disease of native coronary artery without angina pectoris: Secondary | ICD-10-CM

## 2012-07-01 NOTE — Progress Notes (Signed)
HPI  Patient is a 72 yo who I saw in clinic in May for chest pain. With typical symptoms he was set up cardiac catheterizaion. The patient was found to have a severe ostial right coronary stenosis in a large dominant RCA, with triple vessel CAD, and preserved LV systolic function. Coronary artery bypass grafting was not felt to be the best option for him. And therefore patient underwent PTCA with drug-eluting stent to the ostium of the right coronary artery. He was recommended for dual antiplatelet therapy for one year, with aspirin and Plavix. Procedure was complicated by R groin hematoma.  USN showed no evidence of pseudoaneurysm. He has seen Harriet Pho in clinic since procedure The patient denies CP  Breathing is OK  Able to walk without problems  Allergies  Allergen Reactions  . Sulfa Antibiotics     RASH    Current Outpatient Prescriptions  Medication Sig Dispense Refill  . aspirin 81 MG tablet Take 81 mg by mouth daily.      Marland Kitchen atenolol (TENORMIN) 50 MG tablet Take 50 mg by mouth daily.      Marland Kitchen atorvastatin (LIPITOR) 40 MG tablet Take 1 tablet (40 mg total) by mouth daily at 6 PM.  30 tablet  11  . clopidogrel (PLAVIX) 75 MG tablet Take 1 tablet (75 mg total) by mouth daily with breakfast.  30 tablet  11  . fish oil-omega-3 fatty acids 1000 MG capsule Take 2 g by mouth daily.      Marland Kitchen HYDROcodone-acetaminophen (NORCO/VICODIN) 5-325 MG per tablet Take 1-2 tablets by mouth every 6 (six) hours as needed for pain.  20 tablet  0  . Multiple Vitamin (MULTIVITAMIN) capsule Take 1 capsule by mouth daily.      . nitroGLYCERIN (NITROSTAT) 0.4 MG SL tablet Place 1 tablet (0.4 mg total) under the tongue every 5 (five) minutes x 3 doses as needed for chest pain.  30 tablet  12  . sertraline (ZOLOFT) 100 MG tablet Take 50 mg by mouth daily.      . sildenafil (VIAGRA) 100 MG tablet Take 100 mg by mouth daily as needed for erectile dysfunction.      . traMADol (ULTRAM) 50 MG tablet Take 1 tablet (50 mg  total) by mouth every 8 (eight) hours as needed for pain.  90 tablet  0  . vitamin E (VITAMIN E) 400 UNIT capsule Take 800 Units by mouth daily.       No current facility-administered medications for this visit.    Past Medical History  Diagnosis Date  . Anginal pain   . Hypertension   . Dysrhythmia   . Shortness of breath   . H/O hiatal hernia     Past Surgical History  Procedure Laterality Date  . Complete colonoscopy  07-2010  . Knee surgery Right   . Choecystectomy    . Shoulder surgery Bilateral   . Total hip arthroplasty    . Tonsillectomy    . Joint replacement    . Cholecystectomy    . Hernia repair    . Cardiac catheterization    . Fracture surgery      Family History  Problem Relation Age of Onset  . Heart failure Mother   . Heart failure Father   . Stroke Father   . Heart attack Father     History   Social History  . Marital Status: Married    Spouse Name: N/A    Number of Children: N/A  . Years  of Education: N/A   Occupational History  . Not on file.   Social History Main Topics  . Smoking status: Former Smoker    Quit date: 06/15/1968  . Smokeless tobacco: Not on file  . Alcohol Use: Not on file  . Drug Use: No  . Sexually Active: Not on file   Other Topics Concern  . Not on file   Social History Narrative  . No narrative on file    Review of Systems:  All systems reviewed.  They are negative to the above problem except as previously stated.  Vital Signs: BP 112/70  Pulse 62  Ht 5\' 11"  (1.803 m)  Wt 221 lb (100.245 kg)  BMI 30.84 kg/m2  Physical Exam Patient is in NAD HEENT:  Normocephalic, atraumatic. EOMI, PERRLA.  Neck: JVP is normal.  No bruits.  Lungs: clear to auscultation. No rales no wheezes.  Heart: Regular rate and rhythm. Normal S1, S2. No S3.   No significant murmurs. PMI not displaced.  Abdomen:  Supple, nontender. Normal bowel sounds. No masses. No hepatomegaly.  Extremities:   Good distal pulses throughout. No  lower extremity edema. R groin with resolving brusing GU  Bruising of scrotum  Soft  No swelling. Musculoskeletal :moving all extremities.  Neuro:   alert and oriented x3.  CN II-XII grossly intact.  EKG  SR 65.  INcomplete RBBB.  T wave inversion III, AVF. Assessment and Plan:  1.  CAD  Doing well post procedure.  Would continue meds.  Set up for cardiac rehab  2  HTN  Good control  3.  HL  Keep on statin  Patient will follow up with K Lawrence in the fall.

## 2012-07-19 ENCOUNTER — Encounter (HOSPITAL_COMMUNITY): Payer: Medicare Other

## 2012-07-25 ENCOUNTER — Ambulatory Visit: Payer: Medicare Other | Admitting: Internal Medicine

## 2012-07-28 ENCOUNTER — Encounter (HOSPITAL_COMMUNITY): Payer: Self-pay

## 2012-07-28 ENCOUNTER — Encounter (HOSPITAL_COMMUNITY)
Admission: RE | Admit: 2012-07-28 | Discharge: 2012-07-28 | Disposition: A | Discharge status: Home / Self Care | Payer: Medicare Other | Source: Ambulatory Visit | Attending: Internal Medicine | Admitting: Internal Medicine

## 2012-07-28 VITALS — BP 110/62 | HR 57 | Ht 71.0 in | Wt 219.1 lb

## 2012-07-28 DIAGNOSIS — Z9861 Coronary angioplasty status: Secondary | ICD-10-CM | POA: Insufficient documentation

## 2012-07-28 DIAGNOSIS — Z5189 Encounter for other specified aftercare: Secondary | ICD-10-CM | POA: Insufficient documentation

## 2012-07-28 DIAGNOSIS — Z955 Presence of coronary angioplasty implant and graft: Secondary | ICD-10-CM

## 2012-07-28 NOTE — Progress Notes (Signed)
Patient was referred to Cardiac Rehab by Dr. Clifton James due to Stent placement V45.82. During orientation advised patient on arrival and appointment times what to wear, what to do before, during and after exercise. Reviewed attendance and class policy. Talked about inclement weather and class consultation policy. Pt is scheduled to start Cardiac Rehab on 08/01/12 at 9:30. Pt was advised to come to class 5 minutes before class starts. He was also given instructions on meeting with the dietician and attending the Family Structure classes. Pt is eager to get started.

## 2012-07-28 NOTE — Patient Instructions (Signed)
Pt has finished orientation and is scheduled to start CR on 08/01/12 at 9:30 am. Pt has been instructed to arrive to class 15 minutes early for scheduled class. Pt has been instructed to wear comfortable clothing and shoes with rubber soles. Pt has been told to take their medications 1 hour prior to coming to class.  If the patient is not going to attend class, he/she has been instructed to call.

## 2012-08-01 ENCOUNTER — Encounter (HOSPITAL_COMMUNITY)
Admission: RE | Admit: 2012-08-01 | Discharge: 2012-08-01 | Disposition: A | Discharge status: Home / Self Care | Payer: Medicare Other | Source: Ambulatory Visit | Attending: Internal Medicine | Admitting: Internal Medicine

## 2012-08-03 ENCOUNTER — Encounter (HOSPITAL_COMMUNITY)
Admission: RE | Admit: 2012-08-03 | Discharge: 2012-08-03 | Disposition: A | Discharge status: Home / Self Care | Payer: Medicare Other | Source: Ambulatory Visit | Attending: Internal Medicine | Admitting: Internal Medicine

## 2012-08-05 ENCOUNTER — Encounter (HOSPITAL_COMMUNITY)
Admission: RE | Admit: 2012-08-05 | Discharge: 2012-08-05 | Disposition: A | Discharge status: Home / Self Care | Payer: Medicare Other | Source: Ambulatory Visit | Attending: Internal Medicine | Admitting: Internal Medicine

## 2012-08-08 ENCOUNTER — Encounter (HOSPITAL_COMMUNITY)
Admission: RE | Admit: 2012-08-08 | Discharge: 2012-08-08 | Disposition: A | Discharge status: Home / Self Care | Payer: Medicare Other | Source: Ambulatory Visit | Attending: Internal Medicine | Admitting: Internal Medicine

## 2012-08-10 ENCOUNTER — Encounter (HOSPITAL_COMMUNITY)
Admission: RE | Admit: 2012-08-10 | Discharge: 2012-08-10 | Disposition: A | Discharge status: Home / Self Care | Payer: Medicare Other | Source: Ambulatory Visit | Attending: Internal Medicine | Admitting: Internal Medicine

## 2012-08-12 ENCOUNTER — Encounter (HOSPITAL_COMMUNITY)
Admission: RE | Admit: 2012-08-12 | Discharge: 2012-08-12 | Disposition: A | Discharge status: Home / Self Care | Payer: Medicare Other | Source: Ambulatory Visit | Attending: Internal Medicine | Admitting: Internal Medicine

## 2012-08-15 ENCOUNTER — Encounter (HOSPITAL_COMMUNITY)
Admission: RE | Admit: 2012-08-15 | Discharge: 2012-08-15 | Disposition: A | Discharge status: Home / Self Care | Payer: Medicare Other | Source: Ambulatory Visit | Attending: Internal Medicine | Admitting: Internal Medicine

## 2012-08-17 ENCOUNTER — Encounter (HOSPITAL_COMMUNITY)
Admission: RE | Admit: 2012-08-17 | Discharge: 2012-08-17 | Disposition: A | Discharge status: Home / Self Care | Payer: Medicare Other | Source: Ambulatory Visit | Attending: Internal Medicine | Admitting: Internal Medicine

## 2012-08-19 ENCOUNTER — Encounter (HOSPITAL_COMMUNITY)
Admission: RE | Admit: 2012-08-19 | Discharge: 2012-08-19 | Disposition: A | Discharge status: Home / Self Care | Payer: Medicare Other | Source: Ambulatory Visit | Attending: Internal Medicine | Admitting: Internal Medicine

## 2012-08-22 ENCOUNTER — Encounter (HOSPITAL_COMMUNITY)
Admission: RE | Admit: 2012-08-22 | Discharge: 2012-08-22 | Disposition: A | Discharge status: Home / Self Care | Payer: Medicare Other | Source: Ambulatory Visit | Attending: Internal Medicine | Admitting: Internal Medicine

## 2012-08-24 ENCOUNTER — Encounter (HOSPITAL_COMMUNITY)
Admission: RE | Admit: 2012-08-24 | Discharge: 2012-08-24 | Disposition: A | Discharge status: Home / Self Care | Payer: Medicare Other | Source: Ambulatory Visit | Attending: Internal Medicine | Admitting: Internal Medicine

## 2012-08-26 ENCOUNTER — Encounter (HOSPITAL_COMMUNITY)
Admission: RE | Admit: 2012-08-26 | Discharge: 2012-08-26 | Disposition: A | Discharge status: Home / Self Care | Payer: Medicare Other | Source: Ambulatory Visit | Attending: Internal Medicine | Admitting: Internal Medicine

## 2012-08-26 DIAGNOSIS — Z9861 Coronary angioplasty status: Secondary | ICD-10-CM | POA: Insufficient documentation

## 2012-08-26 DIAGNOSIS — Z5189 Encounter for other specified aftercare: Secondary | ICD-10-CM | POA: Insufficient documentation

## 2012-08-29 ENCOUNTER — Encounter (HOSPITAL_COMMUNITY)
Admission: RE | Admit: 2012-08-29 | Discharge: 2012-08-29 | Disposition: A | Discharge status: Home / Self Care | Payer: Medicare Other | Source: Ambulatory Visit | Attending: Internal Medicine | Admitting: Internal Medicine

## 2012-08-31 ENCOUNTER — Encounter (HOSPITAL_COMMUNITY)
Admission: RE | Admit: 2012-08-31 | Discharge: 2012-08-31 | Disposition: A | Discharge status: Home / Self Care | Payer: Medicare Other | Source: Ambulatory Visit | Attending: Internal Medicine | Admitting: Internal Medicine

## 2012-09-02 ENCOUNTER — Encounter (HOSPITAL_COMMUNITY)
Admission: RE | Admit: 2012-09-02 | Discharge: 2012-09-02 | Disposition: A | Discharge status: Home / Self Care | Payer: Medicare Other | Source: Ambulatory Visit | Attending: Internal Medicine | Admitting: Internal Medicine

## 2012-09-05 ENCOUNTER — Encounter (HOSPITAL_COMMUNITY)
Admission: RE | Admit: 2012-09-05 | Discharge: 2012-09-05 | Disposition: A | Discharge status: Home / Self Care | Payer: Medicare Other | Source: Ambulatory Visit | Attending: Internal Medicine | Admitting: Internal Medicine

## 2012-09-07 ENCOUNTER — Encounter (HOSPITAL_COMMUNITY)
Admission: RE | Admit: 2012-09-07 | Discharge: 2012-09-07 | Disposition: A | Discharge status: Home / Self Care | Payer: Medicare Other | Source: Ambulatory Visit | Attending: Internal Medicine | Admitting: Internal Medicine

## 2012-09-09 ENCOUNTER — Encounter (HOSPITAL_COMMUNITY)
Admission: RE | Admit: 2012-09-09 | Discharge: 2012-09-09 | Disposition: A | Discharge status: Home / Self Care | Payer: Medicare Other | Source: Ambulatory Visit | Attending: Internal Medicine | Admitting: Internal Medicine

## 2012-09-12 ENCOUNTER — Encounter (HOSPITAL_COMMUNITY)
Admission: RE | Admit: 2012-09-12 | Discharge: 2012-09-12 | Disposition: A | Discharge status: Home / Self Care | Payer: Medicare Other | Source: Ambulatory Visit | Attending: Internal Medicine | Admitting: Internal Medicine

## 2012-09-12 ENCOUNTER — Ambulatory Visit: Payer: Medicare Other | Admitting: Adult Health

## 2012-09-14 ENCOUNTER — Encounter (HOSPITAL_COMMUNITY)
Admission: RE | Admit: 2012-09-14 | Discharge: 2012-09-14 | Disposition: A | Discharge status: Home / Self Care | Payer: Medicare Other | Source: Ambulatory Visit | Attending: Internal Medicine | Admitting: Internal Medicine

## 2012-09-16 ENCOUNTER — Encounter (HOSPITAL_COMMUNITY)
Admission: RE | Admit: 2012-09-16 | Discharge: 2012-09-16 | Disposition: A | Discharge status: Home / Self Care | Payer: Medicare Other | Source: Ambulatory Visit | Attending: Internal Medicine | Admitting: Internal Medicine

## 2012-09-19 ENCOUNTER — Ambulatory Visit (INDEPENDENT_AMBULATORY_CARE_PROVIDER_SITE_OTHER): Payer: Medicare Other | Admitting: Adult Health

## 2012-09-19 ENCOUNTER — Encounter (HOSPITAL_COMMUNITY)
Admission: RE | Admit: 2012-09-19 | Discharge: 2012-09-19 | Disposition: A | Discharge status: Home / Self Care | Payer: Medicare Other | Source: Ambulatory Visit | Attending: Internal Medicine | Admitting: Internal Medicine

## 2012-09-19 ENCOUNTER — Encounter: Payer: Self-pay | Admitting: Adult Health

## 2012-09-19 VITALS — BP 104/69 | HR 56 | Ht 71.0 in | Wt 213.2 lb

## 2012-09-19 DIAGNOSIS — I200 Unstable angina: Secondary | ICD-10-CM

## 2012-09-19 DIAGNOSIS — I2510 Atherosclerotic heart disease of native coronary artery without angina pectoris: Secondary | ICD-10-CM

## 2012-09-19 DIAGNOSIS — I10 Essential (primary) hypertension: Secondary | ICD-10-CM

## 2012-09-19 NOTE — Assessment & Plan Note (Signed)
No complaints

## 2012-09-19 NOTE — Assessment & Plan Note (Signed)
Blood pressure is low normal.Titrate medications as needed.

## 2012-09-19 NOTE — Patient Instructions (Addendum)
Your physician recommends that you schedule a follow-up appointment in: 6 months  Your physician recommends that you continue on your current medications as directed. Please refer to the Current Medication list given to you today.  Your physician recommends THAT YOU NEED TO CALL OUR OFFICE IF YOU EXPERIENCE ANY DIZZYNESS OR LIGHTHEADEDNESS TO PROCESS POSSIBLE MEDICATION CHANGES [phone], PLEASE CONTINUE YOUR CARDIAC REHAB

## 2012-09-19 NOTE — Progress Notes (Deleted)
Name: Eddie Price    DOB: 11-12-1940  Age: 72 y.o.  MR#: [id number]       PCP:  Angelica Chessman., MD      Insurance: Payor: Cleatrice Burke MEDICARE / Plan: AARP MEDICARE COMPLETE / Product Type: *No Product type* /   CC:   No chief complaint on file. pt noted he just left cardiac rehab prior to this apt, feels good Pt PCP took pt off fish oil and placed on Lovasa 2g BID VS Filed Vitals:   09/19/12 1110  BP: 104/69  Pulse: 56  Height: 5\' 11"  (1.803 m)  Weight: 213 lb 4 oz (96.73 kg)    Weights Current Weight  09/19/12 213 lb 4 oz (96.73 kg)  07/28/12 219 lb 1.6 oz (99.383 kg)  07/01/12 221 lb (100.245 kg)    Blood Pressure  BP Readings from Last 3 Encounters:  09/19/12 104/69  07/28/12 110/62  07/01/12 112/70     Admit date:  (Not on file) Last encounter with RMR:  06/23/2012   Allergy Sulfa antibiotics  Current Outpatient Prescriptions  Medication Sig Dispense Refill  . aspirin 81 MG tablet Take 81 mg by mouth daily.      Marland Kitchen atenolol (TENORMIN) 50 MG tablet Take 50 mg by mouth daily.      Marland Kitchen atorvastatin (LIPITOR) 40 MG tablet Take 1 tablet (40 mg total) by mouth daily at 6 PM.  30 tablet  11  . clopidogrel (PLAVIX) 75 MG tablet Take 1 tablet (75 mg total) by mouth daily with breakfast.  30 tablet  11  . HYDROcodone-acetaminophen (NORCO/VICODIN) 5-325 MG per tablet Take 1-2 tablets by mouth every 6 (six) hours as needed for pain.  20 tablet  0  . Multiple Vitamin (MULTIVITAMIN) capsule Take 1 capsule by mouth daily.      . nitroGLYCERIN (NITROSTAT) 0.4 MG SL tablet Place 1 tablet (0.4 mg total) under the tongue every 5 (five) minutes x 3 doses as needed for chest pain.  30 tablet  12  . sertraline (ZOLOFT) 100 MG tablet Take 50 mg by mouth daily.      . sildenafil (VIAGRA) 100 MG tablet Take 100 mg by mouth daily as needed for erectile dysfunction.      . traMADol (ULTRAM) 50 MG tablet Take 1 tablet (50 mg total) by mouth every 8 (eight) hours as needed for pain.  90  tablet  0  . vitamin E (VITAMIN E) 400 UNIT capsule Take 800 Units by mouth daily.       No current facility-administered medications for this visit.    Discontinued Meds:    Medications Discontinued During This Encounter  Medication Reason  . fish oil-omega-3 fatty acids 1000 MG capsule Error    Patient Active Problem List   Diagnosis Date Noted  . Postoperative ecchymosis 06/23/2012  . Coronary atherosclerosis of native coronary artery 06/16/2012  . Unstable angina 06/16/2012  . Abdominal pain 06/13/2012  . Adjustment disorder with mixed anxiety and depressed mood 06/13/2012  . Allergic rhinitis, cause unspecified 06/13/2012  . Esophageal reflux 06/13/2012  . Unspecified disorder of lipoid metabolism 06/13/2012  . Essential hypertension, benign 06/13/2012  . Other chronic nonalcoholic liver disease 06/13/2012  . Encounter for long-term (current) use of other medications 06/13/2012  . Impotence of organic origin 06/13/2012  . Degeneration of cervical intervertebral disc 06/13/2012  . Psychosexual dysfunction with inhibited sexual excitement 06/13/2012    LABS    Component Value Date/Time   NA 136 06/18/2012  0610   NA 139 06/17/2012 0510   NA 137 06/16/2012 0940   K 4.1 06/18/2012 0610   K 4.5 06/17/2012 0510   K 4.1 06/16/2012 0940   CL 103 06/18/2012 0610   CL 105 06/17/2012 0510   CL 103 06/16/2012 0940   CO2 21 06/18/2012 0610   CO2 26 06/17/2012 0510   CO2 22 06/16/2012 0940   GLUCOSE 109* 06/18/2012 0610   GLUCOSE 111* 06/17/2012 0510   GLUCOSE 119* 06/16/2012 0940   BUN 11 06/18/2012 0610   BUN 11 06/17/2012 0510   BUN 10 06/16/2012 0940   CREATININE 0.92 06/18/2012 0610   CREATININE 0.95 06/17/2012 0510   CREATININE 0.93 06/16/2012 0940   CREATININE 1.06 06/13/2012 1355   CALCIUM 8.7 06/18/2012 0610   CALCIUM 8.7 06/17/2012 0510   CALCIUM 9.0 06/16/2012 0940   GFRNONAA 84* 06/18/2012 0610   GFRNONAA 83* 06/17/2012 0510   GFRNONAA 84* 06/16/2012 0940   GFRAA >90 06/18/2012  0610   GFRAA >90 06/17/2012 0510   GFRAA >90 06/16/2012 0940   CMP     Component Value Date/Time   NA 136 06/18/2012 0610   K 4.1 06/18/2012 0610   CL 103 06/18/2012 0610   CO2 21 06/18/2012 0610   GLUCOSE 109* 06/18/2012 0610   BUN 11 06/18/2012 0610   CREATININE 0.92 06/18/2012 0610   CREATININE 1.06 06/13/2012 1355   CALCIUM 8.7 06/18/2012 0610   PROT 6.5 06/15/2012 1428   ALBUMIN 3.4* 06/15/2012 1428   AST 18 06/15/2012 1428   ALT 15 06/15/2012 1428   ALKPHOS 42 06/15/2012 1428   BILITOT 0.5 06/15/2012 1428   GFRNONAA 84* 06/18/2012 0610   GFRAA >90 06/18/2012 0610       Component Value Date/Time   WBC 8.0 06/18/2012 0610   WBC 10.2 06/17/2012 1900   WBC 6.4 06/17/2012 0800   HGB 13.8 06/18/2012 0610   HGB 13.8 06/17/2012 1900   HGB 15.2 06/17/2012 0800   HCT 39.6 06/18/2012 0610   HCT 39.5 06/17/2012 1900   HCT 44.4 06/17/2012 0800   MCV 93.4 06/18/2012 0610   MCV 93.4 06/17/2012 1900   MCV 94.1 06/17/2012 0800    Lipid Panel     Component Value Date/Time   CHOL 149 06/16/2012 0940   TRIG 239* 06/16/2012 0940   HDL 22* 06/16/2012 0940   CHOLHDL 6.8 06/16/2012 0940   VLDL 48* 06/16/2012 0940   LDLCALC 79 06/16/2012 0940    ABG    Component Value Date/Time   PHART 7.422 06/17/2012 1444   PCO2ART 34.5* 06/17/2012 1444   PO2ART 74.0* 06/17/2012 1444   HCO3 22.5 06/17/2012 1444   TCO2 24 06/17/2012 1444   ACIDBASEDEF 1.0 06/17/2012 1444   O2SAT 95.0 06/17/2012 1444     No results found for this basename: TSH   BNP (last 3 results) No results found for this basename: PROBNP,  in the last 8760 hours Cardiac Panel (last 3 results) No results found for this basename: CKTOTAL, CKMB, TROPONINI, RELINDX,  in the last 72 hours  Iron/TIBC/Ferritin No results found for this basename: iron, tibc, ferritin     EKG Orders placed during the hospital encounter of 06/15/12  . EKG 12-LEAD  . EKG 12-LEAD  . EKG 12-LEAD  . EKG 12-LEAD  . EKG 12-LEAD  . EKG 12-LEAD  . EKG 12-LEAD  . EKG 12-LEAD   . EKG 12-LEAD  . EKG     Prior Assessment and Plan Problem List  as of 09/19/2012   Abdominal pain   Adjustment disorder with mixed anxiety and depressed mood   Allergic rhinitis, cause unspecified   Esophageal reflux   Unspecified disorder of lipoid metabolism   Essential hypertension, benign   Other chronic nonalcoholic liver disease   Encounter for long-term (current) use of other medications   Impotence of organic origin   Degeneration of cervical intervertebral disc   Psychosexual dysfunction with inhibited sexual excitement   Coronary atherosclerosis of native coronary artery   Last Assessment & Plan   06/23/2012 Office Visit Written 06/23/2012 12:34 PM by Jodelle Gross, NP     He is doing well from CV standpoint. He denies recurrent chest pain or DOE. He will need to be referred to cardiac rehab after being seen again in one month. Continue DAPT    Unstable angina   Postoperative ecchymosis   Last Assessment & Plan   06/23/2012 Office Visit Written 06/23/2012 12:33 PM by Jodelle Gross, NP     Area is thoroughly examined with significant skin discoloration of the groin, penis and testicle. Equisitely painful with movement and palpations. Has been ruled out for psuedoanerym.   He has been placed on vicodin by ER. I will start him tramadol 50 mg every 6-8 hours around the clock for the next few days and then prn. He is to put ice/heat on the site for comfort.         Imaging: No results found.

## 2012-09-19 NOTE — Progress Notes (Signed)
HPI: Mr. Eddie Price is a 72 y/o patient of Dr. Tenny Craw we are following for ongoing assessment and management of CAD with stent to the RCA and stenosis of the OM2 and 1st diagonal that were too small for intervention. He is continuing with cardiac rehab without complaints of chest pain, of exercise intolerance. He has not had to take NTG. He is medically compliant.   Allergies  Allergen Reactions  . Sulfa Antibiotics     RASH    Current Outpatient Prescriptions  Medication Sig Dispense Refill  . aspirin 81 MG tablet Take 81 mg by mouth daily.      Marland Kitchen atenolol (TENORMIN) 50 MG tablet Take 50 mg by mouth daily.      Marland Kitchen atorvastatin (LIPITOR) 40 MG tablet Take 1 tablet (40 mg total) by mouth daily at 6 PM.  30 tablet  11  . clopidogrel (PLAVIX) 75 MG tablet Take 1 tablet (75 mg total) by mouth daily with breakfast.  30 tablet  11  . HYDROcodone-acetaminophen (NORCO/VICODIN) 5-325 MG per tablet Take 1-2 tablets by mouth every 6 (six) hours as needed for pain.  20 tablet  0  . Multiple Vitamin (MULTIVITAMIN) capsule Take 1 capsule by mouth daily.      . nitroGLYCERIN (NITROSTAT) 0.4 MG SL tablet Place 1 tablet (0.4 mg total) under the tongue every 5 (five) minutes x 3 doses as needed for chest pain.  30 tablet  12  . omega-3 acid ethyl esters (LOVAZA) 1 G capsule Take 2 g by mouth 2 (two) times daily.      . sertraline (ZOLOFT) 100 MG tablet Take 50 mg by mouth daily.      . sildenafil (VIAGRA) 100 MG tablet Take 100 mg by mouth daily as needed for erectile dysfunction.      . traMADol (ULTRAM) 50 MG tablet Take 1 tablet (50 mg total) by mouth every 8 (eight) hours as needed for pain.  90 tablet  0  . vitamin E (VITAMIN E) 400 UNIT capsule Take 800 Units by mouth daily.       No current facility-administered medications for this visit.    Past Medical History  Diagnosis Date  . Anginal pain   . Hypertension   . Dysrhythmia   . Shortness of breath   . H/O hiatal hernia     Past Surgical  History  Procedure Laterality Date  . Complete colonoscopy  07-2010  . Knee surgery Right   . Choecystectomy    . Shoulder surgery Bilateral   . Total hip arthroplasty    . Tonsillectomy    . Joint replacement    . Cholecystectomy    . Hernia repair    . Cardiac catheterization    . Fracture surgery    . Coronary stent placement  06/17/2012    ROS: Review of systems complete and found to be negative unless listed above  PHYSICAL EXAM BP 104/69  Pulse 56  Ht 5\' 11"  (1.803 m)  Wt 213 lb 4 oz (96.73 kg)  BMI 29.76 kg/m2  General: Well developed, well nourished, in no acute distress Head: Eyes PERRLA, No xanthomas.   Normal cephalic and atramatic  Lungs: Clear bilaterally to auscultation and percussion. Heart: HRRR S1 S2, without MRG.  Pulses are 2+ & equal.            No carotid bruit. No JVD.  No abdominal bruits. No femoral bruits. Abdomen: Bowel sounds are positive, abdomen soft and non-tender without masses or  Hernia's noted. Msk:  Back normal, normal gait. Normal strength and tone for age. Extremities: No clubbing, cyanosis or edema.  DP +1 Neuro: Alert and oriented X 3. Psych:  Good affect, responds appropriately    ASSESSMENT AND PLAN

## 2012-09-19 NOTE — Assessment & Plan Note (Addendum)
He is doing well without cardiac complaint. I am a little concerned about his BP being on the low side. He has lost 12 lbs with exercise and diet. He may have to have atenolol decreased on next visit. I have advised him to let us know if he feels dizzy or lightheaded so that we may decrease medications. Continue risk management.

## 2012-09-21 ENCOUNTER — Encounter (HOSPITAL_COMMUNITY)
Admission: RE | Admit: 2012-09-21 | Discharge: 2012-09-21 | Disposition: A | Discharge status: Home / Self Care | Payer: Medicare Other | Source: Ambulatory Visit | Attending: Internal Medicine | Admitting: Internal Medicine

## 2012-09-23 ENCOUNTER — Encounter (HOSPITAL_COMMUNITY)
Admission: RE | Admit: 2012-09-23 | Discharge: 2012-09-23 | Disposition: A | Discharge status: Home / Self Care | Payer: Medicare Other | Source: Ambulatory Visit | Attending: Internal Medicine | Admitting: Internal Medicine

## 2012-09-26 ENCOUNTER — Encounter (HOSPITAL_COMMUNITY): Payer: Medicare Other

## 2012-09-28 ENCOUNTER — Encounter (HOSPITAL_COMMUNITY)
Admission: RE | Admit: 2012-09-28 | Discharge: 2012-09-28 | Disposition: A | Discharge status: Home / Self Care | Payer: Medicare Other | Source: Ambulatory Visit | Attending: Internal Medicine | Admitting: Internal Medicine

## 2012-09-28 DIAGNOSIS — Z5189 Encounter for other specified aftercare: Secondary | ICD-10-CM | POA: Insufficient documentation

## 2012-09-28 DIAGNOSIS — Z9861 Coronary angioplasty status: Secondary | ICD-10-CM | POA: Insufficient documentation

## 2012-09-30 ENCOUNTER — Encounter (HOSPITAL_COMMUNITY)
Admission: RE | Admit: 2012-09-30 | Discharge: 2012-09-30 | Disposition: A | Discharge status: Home / Self Care | Payer: Medicare Other | Source: Ambulatory Visit | Attending: Internal Medicine | Admitting: Internal Medicine

## 2012-10-03 ENCOUNTER — Encounter (HOSPITAL_COMMUNITY)
Admission: RE | Admit: 2012-10-03 | Discharge: 2012-10-03 | Disposition: A | Discharge status: Home / Self Care | Payer: Medicare Other | Source: Ambulatory Visit | Attending: Internal Medicine | Admitting: Internal Medicine

## 2012-10-05 ENCOUNTER — Encounter (HOSPITAL_COMMUNITY)
Admission: RE | Admit: 2012-10-05 | Discharge: 2012-10-05 | Disposition: A | Discharge status: Home / Self Care | Payer: Medicare Other | Source: Ambulatory Visit | Attending: Internal Medicine | Admitting: Internal Medicine

## 2012-10-07 ENCOUNTER — Encounter (HOSPITAL_COMMUNITY)
Admission: RE | Admit: 2012-10-07 | Discharge: 2012-10-07 | Disposition: A | Discharge status: Home / Self Care | Payer: Medicare Other | Source: Ambulatory Visit | Attending: Internal Medicine | Admitting: Internal Medicine

## 2012-10-10 ENCOUNTER — Encounter (HOSPITAL_COMMUNITY)
Admission: RE | Admit: 2012-10-10 | Discharge: 2012-10-10 | Disposition: A | Discharge status: Home / Self Care | Payer: Medicare Other | Source: Ambulatory Visit | Attending: Internal Medicine | Admitting: Internal Medicine

## 2012-10-12 ENCOUNTER — Encounter (HOSPITAL_COMMUNITY)
Admission: RE | Admit: 2012-10-12 | Discharge: 2012-10-12 | Disposition: A | Discharge status: Home / Self Care | Payer: Medicare Other | Source: Ambulatory Visit | Attending: Internal Medicine | Admitting: Internal Medicine

## 2012-10-14 ENCOUNTER — Encounter (HOSPITAL_COMMUNITY)
Admission: RE | Admit: 2012-10-14 | Discharge: 2012-10-14 | Disposition: A | Discharge status: Home / Self Care | Payer: Medicare Other | Source: Ambulatory Visit | Attending: Internal Medicine | Admitting: Internal Medicine

## 2012-10-17 ENCOUNTER — Encounter (HOSPITAL_COMMUNITY)
Admission: RE | Admit: 2012-10-17 | Discharge: 2012-10-17 | Disposition: A | Discharge status: Home / Self Care | Payer: Medicare Other | Source: Ambulatory Visit | Attending: Internal Medicine | Admitting: Internal Medicine

## 2012-10-19 ENCOUNTER — Encounter (HOSPITAL_COMMUNITY)
Admission: RE | Admit: 2012-10-19 | Discharge: 2012-10-19 | Disposition: A | Discharge status: Home / Self Care | Payer: Medicare Other | Source: Ambulatory Visit | Attending: Internal Medicine | Admitting: Internal Medicine

## 2012-10-21 ENCOUNTER — Encounter: Payer: Self-pay | Admitting: Family Medicine

## 2012-10-21 ENCOUNTER — Ambulatory Visit (INDEPENDENT_AMBULATORY_CARE_PROVIDER_SITE_OTHER): Payer: Medicare Other | Admitting: Family Medicine

## 2012-10-21 ENCOUNTER — Encounter (HOSPITAL_COMMUNITY)
Admission: RE | Admit: 2012-10-21 | Discharge: 2012-10-21 | Disposition: A | Discharge status: Home / Self Care | Payer: Medicare Other | Source: Ambulatory Visit | Attending: Internal Medicine | Admitting: Internal Medicine

## 2012-10-21 VITALS — BP 134/82 | HR 60 | Temp 98.4°F | Resp 18 | Ht 69.0 in | Wt 211.0 lb

## 2012-10-21 DIAGNOSIS — I10 Essential (primary) hypertension: Secondary | ICD-10-CM

## 2012-10-21 DIAGNOSIS — E669 Obesity, unspecified: Secondary | ICD-10-CM

## 2012-10-21 DIAGNOSIS — E785 Hyperlipidemia, unspecified: Secondary | ICD-10-CM

## 2012-10-21 DIAGNOSIS — F418 Other specified anxiety disorders: Secondary | ICD-10-CM

## 2012-10-21 DIAGNOSIS — N529 Male erectile dysfunction, unspecified: Secondary | ICD-10-CM

## 2012-10-21 DIAGNOSIS — I2510 Atherosclerotic heart disease of native coronary artery without angina pectoris: Secondary | ICD-10-CM

## 2012-10-21 DIAGNOSIS — F341 Dysthymic disorder: Secondary | ICD-10-CM

## 2012-10-21 NOTE — Patient Instructions (Addendum)
Release of records- Dr. Bernadette Hoit Release of records- [facility]  I will review records  F/U 3 months

## 2012-10-22 ENCOUNTER — Encounter: Payer: Self-pay | Admitting: Family Medicine

## 2012-10-22 DIAGNOSIS — E669 Obesity, unspecified: Secondary | ICD-10-CM | POA: Insufficient documentation

## 2012-10-22 DIAGNOSIS — F418 Other specified anxiety disorders: Secondary | ICD-10-CM | POA: Insufficient documentation

## 2012-10-22 NOTE — Assessment & Plan Note (Signed)
Obtain records continue meds

## 2012-10-22 NOTE — Progress Notes (Signed)
  Subjective:    Patient ID: Eddie Price, male    DOB: 1940-12-01, 72 y.o.   MRN: [id number]  HPI Pt here to establish care. Previous PCP Dr. Riley Nearing. Cardiology- [facility]- Dr. Dietrich Pates Medications and history reviewed. 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 Completes cardiac rehab Monday. Has been doing well, very few episodes of CP, has not used NTG. On Plavix and ASA.  Hyperlipidemia- on statin therapy, has history of High TG in past, asked if he could come off Lovaza due to price  HTN- long standing history 20 years, has been on atenolol for this, doing well, no dizziness, no fatigue Depression- started on zoloft by PCP about 5 years ago, very stressful job, has done well on this  Recent FLP, PSA and TSH reviewed Colonoscopy- [facility], [facility]- mutiple interventions shoulders and knees- [facility]- Dr. Thomasena Edis  Review of Systems  GEN- denies fatigue, fever, weight loss,weakness, recent illness HEENT- denies eye drainage, change in vision, nasal discharge, CVS- denies chest pain, palpitations RESP- denies SOB, cough, wheeze ABD- denies N/V, change in stools, abd pain GU- denies dysuria, hematuria, dribbling, incontinence MSK- denies joint pain, muscle aches, injury Neuro- denies headache, dizziness, syncope, seizure activity      Objective:   Physical Exam GEN- NAD, alert and oriented x3 HEENT- PERRL, EOMI, non injected sclera, pink conjunctiva, MMM, oropharynx clear Neck- Supple,  CVS- RRR, no murmur RESP-CTAB ABD-NABS,soft,NT,ND EXT- No edema Pulses- Radial, DP- 2+ Psych- normal affect and mood        Assessment & Plan:

## 2012-10-22 NOTE — Assessment & Plan Note (Signed)
Reviewed FLP, labs look good, History of Low HDL per pt, now up to 21 from 9  TG look good, discussed we could change to OTC fish oil and track his TG, he decided to continue with lovaza and lipitor

## 2012-10-22 NOTE — Assessment & Plan Note (Signed)
Reviewed cardiology note, medial therapy, s/p stenting Doing well compliant

## 2012-10-22 NOTE — Assessment & Plan Note (Signed)
Continue viagra.  

## 2012-10-22 NOTE — Assessment & Plan Note (Signed)
Well controlled, no changes 

## 2012-10-24 ENCOUNTER — Encounter (HOSPITAL_COMMUNITY)
Admission: RE | Admit: 2012-10-24 | Discharge: 2012-10-24 | Disposition: A | Discharge status: Home / Self Care | Payer: Medicare Other | Source: Ambulatory Visit | Attending: Internal Medicine | Admitting: Internal Medicine

## 2012-11-10 ENCOUNTER — Telehealth: Payer: Self-pay | Admitting: Family Medicine

## 2012-11-10 NOTE — Telephone Encounter (Signed)
Viagra 100mg  as directed

## 2012-11-10 NOTE — Telephone Encounter (Signed)
I would not recommend he take Viagra at this time, due to recent CAD with stenting in place. Taking at this time is too much risk for his heart.

## 2012-11-11 NOTE — Telephone Encounter (Signed)
Refill denied 

## 2012-11-26 ENCOUNTER — Telehealth: Payer: Self-pay | Admitting: Family Medicine

## 2012-11-26 NOTE — Telephone Encounter (Signed)
Pt called Thursday started with cough with congestion, low grade fever, no CP, no SOB No OTC meds Advised tylenol, and Robitussin DM, no decongestant Fluids with meds If this worsens come in Monday for check  Working Dx URI

## 2012-12-02 ENCOUNTER — Telehealth: Payer: Self-pay | Admitting: *Deleted

## 2012-12-02 MED ORDER — AMOXICILLIN 500 MG PO CAPS: 500.0000 mg | ORAL_CAPSULE | Freq: Three times a day (TID) | ORAL | Status: DC

## 2012-12-02 MED ORDER — FLUTICASONE PROPIONATE 50 MCG/ACT NA SUSP: 2.0000 | Freq: Every day | NASAL | Status: DC

## 2012-12-02 MED ORDER — BENZONATATE 100 MG PO CAPS: 100.0000 mg | ORAL_CAPSULE | Freq: Three times a day (TID) | ORAL | Status: DC | PRN

## 2012-12-02 NOTE — Telephone Encounter (Signed)
Start Amoxicllin as prescribed Flonase  Tessalon perrles Come in next week if not improving

## 2012-12-02 NOTE — Telephone Encounter (Signed)
Pt called stating that he is still not feeling better, still has head congestion, sinus drainage, cough with yellow phlegm wants to know if you can call something in for him.he is OOT and will be here in [place] at t6pm but we will be closed.

## 2012-12-05 NOTE — Telephone Encounter (Signed)
Pt aware.

## 2012-12-27 NOTE — Addendum Note (Signed)
Encounter addended by: Angelica Pou, RN on: 12/27/2012 11:22 AM<BR>     Documentation filed: Clinical Notes

## 2012-12-27 NOTE — Addendum Note (Signed)
Encounter addended by: Angelica Pou, RN on: 12/27/2012 11:21 AM<BR>     Documentation filed: Notes Section

## 2012-12-27 NOTE — Progress Notes (Signed)
Cardiac Rehabilitation Program Outcomes Report   Orientation:  07/28/2012 Graduate Date:  10/24/2012 Discharge Date:  10/24/2012 # of sessions completed: 36  Cardiologist: Dietrich Pates Family MD:  Tiney Rouge Time:  09:30  A.  Exercise Program:  Tolerates exercise @ 3.85 METS for 15 minutes and Discharged to home exercise program.  Anticipated compliance:  excellent  B.  Mental Health:  Good mental attitude  C.  Education/Instruction/Skills  Accurately checks own pulse.  Rest:  62  Exercise:  102, Knows THR for exercise, Uses Perceived Exertion Scale and/or Dyspnea Scale and Attended 13 education classes  Uses Perceived Exertion Scale and/or Dyspnea Scale  D.  Nutrition/Weight Control/Body Composition:  Adherence to prescribed nutrition program: good    E.  Blood Lipids    Lab Results  Component Value Date   CHOL 149 06/16/2012   HDL 22* 06/16/2012   LDLCALC 79 06/16/2012   TRIG 239* 06/16/2012   CHOLHDL 6.8 06/16/2012    F.  Lifestyle Changes:  Making positive lifestyle changes and Not smoking:  Quit 1970  G.  Symptoms noted with exercise:  Asymptomatic  Report Completed By:  Lelon Huh. Sumaya Riedesel RN   Comments:  This is patients graduation report. He achieved a peak METS of 3.85. His resting HR was 62  And his resting BP was 110/60, and his peak Hr was 102 and peak BP was 150/92. He has done very well while in rehab. He plans to continue exercise by walking outside.

## 2012-12-27 NOTE — Progress Notes (Signed)
Cardiac Rehabilitation Program Outcomes Report   Orientation:  07/28/2012 Graduate Date:  tbd Discharge Date:  tbd # of sessions completed: 3 DX Stent X 1  Cardiologist: Dietrich Pates Family MD:  Hart Carwin  Class Time:  09:30  A.  Exercise Program:  Tolerates exercise @ 3.86 METS for 15 minutes and Walk Test Results:  Pre: Walk test not done.  B.  Mental Health:  Good mental attitude  C.  Education/Instruction/Skills  Accurately checks own pulse.  Rest:  75  Exercise:  92, Knows THR for exercise and Uses Perceived Exertion Scale and/or Dyspnea Scale  Uses Perceived Exertion Scale and/or Dyspnea Scale  D.  Nutrition/Weight Control/Body Composition:  Adherence to prescribed nutrition program: good    E.  Blood Lipids    Lab Results  Component Value Date   CHOL 149 06/16/2012   HDL 22* 06/16/2012   LDLCALC 79 06/16/2012   TRIG 239* 06/16/2012   CHOLHDL 6.8 06/16/2012    F.  Lifestyle Changes:  Making positive lifestyle changes and Not smoking:  Quit 1970  G.  Symptoms noted with exercise:  Asymptomatic  Report Completed By:  Lelon Huh. Timi Reeser RN   Comments:  This is patients 1st week report He did well during his first week. He achieved a peak METS of 3.86. His resting HR was 75 and resting BP was 108/58, Peak HR was 92 and peak BP was 132/70. A report will follow on his 18th visit.

## 2012-12-27 NOTE — Progress Notes (Signed)
Cardiac Rehabilitation Program Outcomes Report   Orientation:  07/28/2012 Graduate Date:  tbd Discharge Date:  tbd # of sessions completed: 18 DX : Stent X1 and CAD  Cardiologist: Dietrich Pates Family MD:  Tiney Rouge Time:  09:30  A.  Exercise Program:  Tolerates exercise @ 3.85 METS for 15  minutes  B.  Mental Health:  Good mental attitude  C.  Education/Instruction/Skills  Accurately checks own pulse.  Rest:  91  Exercise:  91, Knows THR for exercise and Uses Perceived Exertion Scale and/or Dyspnea Scale  Uses Perceived Exertion Scale and/or Dyspnea Scale  D.  Nutrition/Weight Control/Body Composition:  Adherence to prescribed nutrition program: good    E.  Blood Lipids    Lab Results  Component Value Date   CHOL 149 06/16/2012   HDL 22* 06/16/2012   LDLCALC 79 06/16/2012   TRIG 239* 06/16/2012   CHOLHDL 6.8 06/16/2012    F.  Lifestyle Changes:  Making positive lifestyle changes  G.  Symptoms noted with exercise:  Asymptomatic  Report Completed By:  Lelon Huh. Sherrika Weakland RN   Comments:  This is patients halfway report. HE achieved a peak METS of 3.85, His resting HR was 91 and resting BP was 122/68 and peak HR was 91 and peak BP was 130/62. A graduation report will follow upon his 36th visit.

## 2012-12-27 NOTE — Addendum Note (Signed)
Encounter addended by: Angelica Pou, RN on: 12/27/2012 11:12 AM<BR>     Documentation filed: Clinical Notes

## 2012-12-27 NOTE — Addendum Note (Signed)
Encounter addended by: Angelica Pou, RN on: 12/27/2012 11:33 AM<BR>     Documentation filed: Notes Section

## 2012-12-27 NOTE — Addendum Note (Signed)
Encounter addended by: Angelica Pou, RN on: 12/27/2012 11:34 AM<BR>     Documentation filed: Clinical Notes

## 2012-12-27 NOTE — Addendum Note (Signed)
Encounter addended by: Angelica Pou, RN on: 12/27/2012 11:10 AM<BR>     Documentation filed: Notes Section

## 2013-01-03 ENCOUNTER — Telehealth: Payer: Self-pay | Admitting: Family Medicine

## 2013-01-03 NOTE — Telephone Encounter (Signed)
Pt is wanting to know if he has had a CPE this year he had his records sent here from [facility] and he is not sure if he has had one or not Call back number is [phone]

## 2013-01-05 NOTE — Telephone Encounter (Signed)
Why am I getting this?  Review patients records in the media tab.  Patient is a new patient or patient may call [facility] and ask directly.

## 2013-01-06 NOTE — Telephone Encounter (Signed)
We have tried numerous times to contact you without any success. We need for you to contact our office so that we may discuss you lab work. Please call at the above number.  If you have any questions or concerns, please don't hesitate to call.  Sincerely,

## 2013-01-06 NOTE — Telephone Encounter (Signed)
Last note written in error, left message for pt to return call

## 2013-01-23 ENCOUNTER — Ambulatory Visit (INDEPENDENT_AMBULATORY_CARE_PROVIDER_SITE_OTHER): Payer: Medicare Other | Admitting: Family Medicine

## 2013-01-23 VITALS — BP 126/80 | HR 72 | Temp 98.2°F | Resp 18 | Wt 210.0 lb

## 2013-01-23 DIAGNOSIS — Z23 Encounter for immunization: Secondary | ICD-10-CM

## 2013-01-23 DIAGNOSIS — Z0000 Encounter for general adult medical examination without abnormal findings: Secondary | ICD-10-CM

## 2013-01-23 DIAGNOSIS — I10 Essential (primary) hypertension: Secondary | ICD-10-CM

## 2013-01-23 DIAGNOSIS — E785 Hyperlipidemia, unspecified: Secondary | ICD-10-CM

## 2013-01-23 LAB — CBC WITH DIFFERENTIAL/PLATELET
Basophils Absolute: 0 10*3/uL (ref 0.0–0.1)
Eosinophils Absolute: 0.3 10*3/uL (ref 0.0–0.7)
HCT: 44.3 % (ref 39.0–52.0)
Hemoglobin: 15.4 g/dL (ref 13.0–17.0)
Lymphocytes Relative: 32 % (ref 12–46)
Lymphs Abs: 2.2 10*3/uL (ref 0.7–4.0)
MCHC: 34.8 g/dL (ref 30.0–36.0)
MCV: 94.7 fL (ref 78.0–100.0)
Monocytes Absolute: 0.7 10*3/uL (ref 0.1–1.0)
Monocytes Relative: 10 % (ref 3–12)
Neutro Abs: 3.5 10*3/uL (ref 1.7–7.7)
RBC: 4.68 MIL/uL (ref 4.22–5.81)
RDW: 14.1 % (ref 11.5–15.5)
WBC: 6.8 10*3/uL (ref 4.0–10.5)

## 2013-01-23 LAB — COMPREHENSIVE METABOLIC PANEL
ALT: 28 U/L (ref 0–53)
Albumin: 4.3 g/dL (ref 3.5–5.2)
BUN: 10 mg/dL (ref 6–23)
CO2: 25 mEq/L (ref 19–32)
Calcium: 9.2 mg/dL (ref 8.4–10.5)
Chloride: 106 mEq/L (ref 96–112)
Creat: 0.91 mg/dL (ref 0.50–1.35)
Glucose, Bld: 89 mg/dL (ref 70–99)
Total Bilirubin: 0.7 mg/dL (ref 0.3–1.2)

## 2013-01-23 LAB — LIPID PANEL
Cholesterol: 96 mg/dL (ref 0–200)
HDL: 31 mg/dL — ABNORMAL LOW (ref >39)
Triglycerides: 96 mg/dL (ref <150)

## 2013-01-23 NOTE — Patient Instructions (Signed)
Prevnar 13 g- pneumonia Booster Continue current meds We will send letter if labs are normal F/U 3 months

## 2013-01-24 ENCOUNTER — Encounter: Payer: Self-pay | Admitting: Family Medicine

## 2013-01-24 MED ORDER — SILDENAFIL CITRATE 20 MG PO TABS: ORAL_TABLET | ORAL | Status: DC

## 2013-01-24 NOTE — Progress Notes (Signed)
Subjective:    Patient ID: Eddie Price, male    DOB: [birth year], 72 y.o.   MRN: [id number]  HPI   Subjective:   Patient presents for Medicare Annual/Subsequent preventive examination.  No specific concerns today. He is due for fasting labs. He's also due for Prevnar 13  He would like to find a way to get his Viagra covered Review Past Medical/Family/Social: - reviewed EMR   Risk Factors  Current exercise habits: Walks/Active Dietary issues discussed:  Healthy Eating   Cardiac risk factors: Obesity (BMI >= 30 kg/m2).   Depression Screen  (Note: if answer to either of the following is "Yes", a more complete depression screening is indicated)  Over the past two weeks, have you felt down, depressed or hopeless? No Over the past two weeks, have you felt little interest or pleasure in doing things? No Have you lost interest or pleasure in daily life? No Do you often feel hopeless? No Do you cry easily over simple problems? No   Activities of Daily Living  In your present state of health, do you have any difficulty performing the following activities?:  Driving? No  Managing money? No  Feeding yourself? No  Getting from bed to chair? No  Climbing a flight of stairs? No  Preparing food and eating?: No  Bathing or showering? No  Getting dressed: No  Getting to the toilet? No  Using the toilet:No  Moving around from place to place: No  In the past year have you fallen or had a near fall?:No  Are you sexually active? No  Do you have more than one partner? No   Hearing Difficulties: No  Do you often ask people to speak up or repeat themselves? No  Do you experience ringing or noises in your ears? No Do you have difficulty understanding soft or whispered voices? No  Do you feel that you have a problem with memory? No Do you often misplace items? No  Do you feel safe at home? Yes  Cognitive Testing  Alert? Yes Normal Appearance?Yes  Oriented to person? Yes Place? Yes    Time? Yes  Recall of three objects? Yes  Can perform simple calculations? Yes  Displays appropriate judgment?Yes  Can read the correct time from a watch face?Yes   List the Names of Other Physician/Practitioners you currently use: Dr. Tenny Craw- Cardiology   Eye doctor- Dr. Reggie Pile   Screening Tests / Date Colonoscopy   - UTD                 Zostavax - UTD Influenza Vaccine - UTD Tetanus/tdap - UTD    Assessment:    Annual wellness medicare exam   Plan:    During the course of the visit the patient was educated and counseled about appropriate screening and preventive services including:  Prevnar 13  Fasting labs.  Cardiology records on file Diet review for nutrition referral? Yes ____ Not Indicated __x__  Patient Instructions (the written plan) was given to the patient.  Medicare Attestation  I have personally reviewed:  The patient's medical and social history  Their use of alcohol, tobacco or illicit drugs  Their current medications and supplements  The patient's functional ability including ADLs,fall risks, home safety risks, cognitive, and hearing and visual impairment  Diet and physical activities  Evidence for depression or mood disorders  The patient's weight, height, BMI, and visual acuity have been recorded in the chart. I have made referrals, counseling, and provided education  to the patient based on review of the above and I have provided the patient with a written personalized care plan for preventive services.        Review of Systems  GEN- denies fatigue, fever, weight loss,weakness, recent illness HEENT- denies eye drainage, change in vision, nasal discharge, CVS- denies chest pain, palpitations RESP- denies SOB, cough, wheeze ABD- denies N/V, change in stools, abd pain GU- denies dysuria, hematuria, dribbling, incontinence MSK- denies joint pain, muscle aches, injury Neuro- denies headache, dizziness, syncope, seizure activity      Objective:    Physical Exam GEN- NAD, alert and oriented x3 HEENT- PERRL, EOMI, non injected sclera, pink conjunctiva, MMM, oropharynx clear Neck- Supple,  CVS- RRR, no murmur RESP-CTAB ABD-NABS,soft,NT,ND EXT- No edema Pulses- Radial, DP- 2+ Psych- normal affect and mood        Assessment & Plan:

## 2013-01-24 NOTE — Assessment & Plan Note (Signed)
Well controlled 

## 2013-01-24 NOTE — Addendum Note (Signed)
Addended by: Milinda Antis F on: 01/24/2013 05:16 PM   Modules accepted: Orders

## 2013-01-24 NOTE — Assessment & Plan Note (Signed)
Complete lovaza then change to Fish oil Omega 3  2000mg  BID

## 2013-01-25 ENCOUNTER — Telehealth: Payer: Self-pay | Admitting: Family Medicine

## 2013-01-25 NOTE — Telephone Encounter (Signed)
Pt called and left voicemail stating he was returning a call about his prescription Call back number is [phone]

## 2013-02-14 ENCOUNTER — Telehealth: Payer: Self-pay | Admitting: Family Medicine

## 2013-02-14 MED ORDER — ATENOLOL 50 MG PO TABS: 50.0000 mg | ORAL_TABLET | Freq: Every day | ORAL | Status: DC

## 2013-02-14 NOTE — Telephone Encounter (Signed)
Meds refilled.

## 2013-02-14 NOTE — Telephone Encounter (Signed)
Pt is needing a refill on his BP medication Call back number is [phone] Pharmacy [facility] in [place]

## 2013-03-08 ENCOUNTER — Telehealth: Payer: Self-pay | Admitting: *Deleted

## 2013-03-08 NOTE — Telephone Encounter (Signed)
Noted incoming call from Neysa Hotter NP to advise pt is currently in office per has vomitted for the past three days and notes chest pains however notes normal EKG and requested that the EKG be reviewed from the pt cardiologist prior to allowing him to leave her office, faxed Ekg received and Dr Harl Bowie gave verbal instructions that the pt EKG is normal and if the NP wants to send the pt to the ED this will be JB NP call to do so, JB NP understood instructions, ekg signed to be scanned into pt chart

## 2013-03-09 ENCOUNTER — Encounter: Payer: Self-pay | Admitting: Cardiology

## 2013-03-20 ENCOUNTER — Encounter: Payer: Self-pay | Admitting: Adult Health

## 2013-03-20 ENCOUNTER — Encounter (INDEPENDENT_AMBULATORY_CARE_PROVIDER_SITE_OTHER): Payer: Self-pay

## 2013-03-20 ENCOUNTER — Ambulatory Visit (INDEPENDENT_AMBULATORY_CARE_PROVIDER_SITE_OTHER): Payer: Medicare Other | Admitting: Adult Health

## 2013-03-20 VITALS — BP 132/80 | HR 60 | Ht 71.0 in | Wt 212.0 lb

## 2013-03-20 DIAGNOSIS — I2510 Atherosclerotic heart disease of native coronary artery without angina pectoris: Secondary | ICD-10-CM

## 2013-03-20 DIAGNOSIS — I10 Essential (primary) hypertension: Secondary | ICD-10-CM

## 2013-03-20 DIAGNOSIS — I200 Unstable angina: Secondary | ICD-10-CM

## 2013-03-20 NOTE — Progress Notes (Signed)
HPI: Eddie Price is a 73 year old patient of Dr. Harrington Challenger we are following for ongoing assessment and management of CAD with stent to the right coronary artery and stenosis of the OM 2 and first diagonal it was too small for intervention. The patient was last seen in the office in August of 2014, had been continuing cardiac rehabilitation without complaints of chest pain or exercise intolerance.   On last visit the patient had lost 12 pounds with exercise and diet. We are watching his blood pressure with need to decrease medication should he become hypotensive.   He comes today feeling very well. He denies complaints of chest pain dyspnea on exertion. His energy level remains strong. He is medically compliant. He denies any bleeding issues on Plavix. He does have concerns about 10 medications that he is taking that are noncardiac, Viagra and Revatio. He states the generic is the same name, and wonders why he is on both. These have been provided by his PCP.  Allergies  Allergen Reactions  . Sulfa Antibiotics     RASH    Current Outpatient Prescriptions  Medication Sig Dispense Refill  . aspirin 81 MG tablet Take 81 mg by mouth daily.      Marland Kitchen atenolol (TENORMIN) 50 MG tablet Take 1 tablet (50 mg total) by mouth daily.  30 tablet  3  . atorvastatin (LIPITOR) 40 MG tablet Take 20 mg by mouth daily at 6 PM.      . clopidogrel (PLAVIX) 75 MG tablet Take 1 tablet (75 mg total) by mouth daily with breakfast.  30 tablet  11  . Multiple Vitamin (MULTIVITAMIN) capsule Take 1 capsule by mouth daily.      . nitroGLYCERIN (NITROSTAT) 0.4 MG SL tablet Place 1 tablet (0.4 mg total) under the tongue every 5 (five) minutes x 3 doses as needed for chest pain.  30 tablet  12  . Omega 3 1000 MG CAPS Take 2,000 mg by mouth 2 (two) times daily.      . ondansetron (ZOFRAN-ODT) 8 MG disintegrating tablet       . sertraline (ZOLOFT) 100 MG tablet Take 50 mg by mouth daily.      . sildenafil (VIAGRA) 100 MG tablet Take  100 mg by mouth daily as needed for erectile dysfunction.      . vitamin E (VITAMIN E) 400 UNIT capsule Take 800 Units by mouth daily.      . sildenafil (REVATIO) 20 MG tablet Take 3 tablets daily  90 tablet  1   No current facility-administered medications for this visit.    Past Medical History  Diagnosis Date  . Hypertension   . Dysrhythmia   . H/O hiatal hernia   . Hyperlipidemia   . Depression   . CAD, multiple vessel     Past Surgical History  Procedure Laterality Date  . Complete colonoscopy  07-2010  . Knee surgery Right   . Choecystectomy    . Shoulder surgery Bilateral   . Total hip arthroplasty      Right  . Tonsillectomy    . Joint replacement    . Cholecystectomy    . Hernia repair    . Cardiac catheterization    . Fracture surgery    . Coronary stent placement  06/17/2012    ROS: Review of systems complete and found to be negative unless listed above  PHYSICAL EXAM BP 132/80  Pulse 60  Ht 5\' 11"  (1.803 m)  Wt 212 lb (96.163  kg)  BMI 29.58 kg/m2  General: Well developed, well nourished, in no acute distress Head: Eyes PERRLA, No xanthomas.   Normal cephalic and atramatic  Lungs: Clear bilaterally to auscultation and percussion. Heart: HRRR S1 S2, without MRG.  Pulses are 2+ & equal.            No carotid bruit. No JVD.  No abdominal bruits. No femoral bruits. Abdomen: Bowel sounds are positive, abdomen soft and non-tender without masses or                  Hernia's noted. Msk:  Back normal, normal gait. Normal strength and tone for age. Extremities: No clubbing, cyanosis or edema.  DP +1 Neuro: Alert and oriented X 3. Psych:  Good affect, responds appropriately    ASSESSMENT AND PLAN

## 2013-03-20 NOTE — Progress Notes (Deleted)
Name: Eddie Price    DOB: May 06, 1940  Age: 73 y.o.  MR#: [id number]       PCP:  Vic Blackbird, MD      Insurance: Payor: [employer] MEDICARE / Plan: AARP MEDICARE COMPLETE / Product Type: *No Product type* /   CC:    Chief Complaint  Patient presents with  . Coronary Artery Disease  . Hypertension    VS Filed Vitals:   03/20/13 1350  BP: 132/80  Pulse: 60  Height: 5\' 11"  (1.803 m)  Weight: 212 lb (96.163 kg)    Weights Current Weight  03/20/13 212 lb (96.163 kg)  01/23/13 210 lb (95.255 kg)  10/21/12 211 lb (95.709 kg)    Blood Pressure  BP Readings from Last 3 Encounters:  03/20/13 132/80  01/23/13 126/80  10/21/12 134/82     Admit date:  (Not on file) Last encounter with RMR:  Visit date not found   Allergy Sulfa antibiotics  Current Outpatient Prescriptions  Medication Sig Dispense Refill  . aspirin 81 MG tablet Take 81 mg by mouth daily.      Marland Kitchen atenolol (TENORMIN) 50 MG tablet Take 1 tablet (50 mg total) by mouth daily.  30 tablet  3  . atorvastatin (LIPITOR) 40 MG tablet Take 20 mg by mouth daily at 6 PM.      . clopidogrel (PLAVIX) 75 MG tablet Take 1 tablet (75 mg total) by mouth daily with breakfast.  30 tablet  11  . Multiple Vitamin (MULTIVITAMIN) capsule Take 1 capsule by mouth daily.      . nitroGLYCERIN (NITROSTAT) 0.4 MG SL tablet Place 1 tablet (0.4 mg total) under the tongue every 5 (five) minutes x 3 doses as needed for chest pain.  30 tablet  12  . Omega 3 1000 MG CAPS Take 2,000 mg by mouth 2 (two) times daily.      . ondansetron (ZOFRAN-ODT) 8 MG disintegrating tablet       . sertraline (ZOLOFT) 100 MG tablet Take 50 mg by mouth daily.      . sildenafil (VIAGRA) 100 MG tablet Take 100 mg by mouth daily as needed for erectile dysfunction.      . vitamin E (VITAMIN E) 400 UNIT capsule Take 800 Units by mouth daily.      . sildenafil (REVATIO) 20 MG tablet Take 3 tablets daily  90 tablet  1   No current facility-administered medications  for this visit.    Discontinued Meds:    Medications Discontinued During This Encounter  Medication Reason  . atorvastatin (LIPITOR) 40 MG tablet     Patient Active Problem List   Diagnosis Date Noted  . Depression with anxiety 10/22/2012  . Obesity, unspecified 10/22/2012  . Hyperlipidemia   . Postoperative ecchymosis 06/23/2012  . Coronary atherosclerosis of native coronary artery 06/16/2012  . Unstable angina 06/16/2012  . Esophageal reflux 06/13/2012  . Essential hypertension, benign 06/13/2012  . Impotence of organic origin 06/13/2012  . Degeneration of cervical intervertebral disc 06/13/2012    LABS    Component Value Date/Time   NA 141 01/23/2013 1216   NA 136 06/18/2012 0610   NA 139 06/17/2012 0510   K 4.9 01/23/2013 1216   K 4.1 06/18/2012 0610   K 4.5 06/17/2012 0510   CL 106 01/23/2013 1216   CL 103 06/18/2012 0610   CL 105 06/17/2012 0510   CO2 25 01/23/2013 1216   CO2 21 06/18/2012 0610   CO2 26 06/17/2012 0510  GLUCOSE 89 01/23/2013 1216   GLUCOSE 109* 06/18/2012 0610   GLUCOSE 111* 06/17/2012 0510   BUN 10 01/23/2013 1216   BUN 11 06/18/2012 0610   BUN 11 06/17/2012 0510   CREATININE 0.91 01/23/2013 1216   CREATININE 0.92 06/18/2012 0610   CREATININE 0.95 06/17/2012 0510   CREATININE 0.93 06/16/2012 0940   CREATININE 1.06 06/13/2012 1355   CALCIUM 9.2 01/23/2013 1216   CALCIUM 8.7 06/18/2012 0610   CALCIUM 8.7 06/17/2012 0510   GFRNONAA 84* 06/18/2012 0610   GFRNONAA 83* 06/17/2012 0510   GFRNONAA 84* 06/16/2012 0940   GFRAA >90 06/18/2012 0610   GFRAA >90 06/17/2012 0510   GFRAA >90 06/16/2012 0940   CMP     Component Value Date/Time   NA 141 01/23/2013 1216   K 4.9 01/23/2013 1216   CL 106 01/23/2013 1216   CO2 25 01/23/2013 1216   GLUCOSE 89 01/23/2013 1216   BUN 10 01/23/2013 1216   CREATININE 0.91 01/23/2013 1216   CREATININE 0.92 06/18/2012 0610   CALCIUM 9.2 01/23/2013 1216   PROT 7.1 01/23/2013 1216   ALBUMIN 4.3 01/23/2013 1216   AST 24  01/23/2013 1216   ALT 28 01/23/2013 1216   ALKPHOS 45 01/23/2013 1216   BILITOT 0.7 01/23/2013 1216   GFRNONAA 84* 06/18/2012 0610   GFRAA >90 06/18/2012 0610       Component Value Date/Time   WBC 6.8 01/23/2013 1216   WBC 8.0 06/18/2012 0610   WBC 10.2 06/17/2012 1900   HGB 15.4 01/23/2013 1216   HGB 13.8 06/18/2012 0610   HGB 13.8 06/17/2012 1900   HCT 44.3 01/23/2013 1216   HCT 39.6 06/18/2012 0610   HCT 39.5 06/17/2012 1900   MCV 94.7 01/23/2013 1216   MCV 93.4 06/18/2012 0610   MCV 93.4 06/17/2012 1900    Lipid Panel     Component Value Date/Time   CHOL 96 01/23/2013 1216   TRIG 96 01/23/2013 1216   HDL 31* 01/23/2013 1216   CHOLHDL 3.1 01/23/2013 1216   VLDL 19 01/23/2013 1216   LDLCALC 46 01/23/2013 1216    ABG    Component Value Date/Time   PHART 7.422 06/17/2012 1444   PCO2ART 34.5* 06/17/2012 1444   PO2ART 74.0* 06/17/2012 1444   HCO3 22.5 06/17/2012 1444   TCO2 24 06/17/2012 1444   ACIDBASEDEF 1.0 06/17/2012 1444   O2SAT 95.0 06/17/2012 1444     No results found for this basename: TSH   BNP (last 3 results) No results found for this basename: PROBNP,  in the last 8760 hours Cardiac Panel (last 3 results) No results found for this basename: CKTOTAL, CKMB, TROPONINI, RELINDX,  in the last 72 hours  Iron/TIBC/Ferritin No results found for this basename: iron, tibc, ferritin     EKG Orders placed in visit on 03/09/13  . EKG 12-LEAD     Prior Assessment and Plan Problem List as of 03/20/2013   Esophageal reflux   Essential hypertension, benign   Last Assessment & Plan   01/23/2013 Office Visit Written 01/24/2013  7:58 AM by Alycia Rossetti, MD     Well controlled    Impotence of organic origin   Last Assessment & Plan   10/21/2012 Office Visit Written 10/22/2012  7:38 AM by Alycia Rossetti, MD     Continue viagra    Degeneration of cervical intervertebral disc   Coronary atherosclerosis of native coronary artery   Last Assessment & Plan   10/21/2012  Office Visit  Written 10/22/2012  7:38 AM by Alycia Rossetti, MD     Reviewed cardiology note, medial therapy, s/p stenting Doing well compliant     Unstable angina   Last Assessment & Plan   09/19/2012 Office Visit Written 09/19/2012 12:07 PM by Lendon Colonel, NP     No complaints.     Postoperative ecchymosis   Last Assessment & Plan   06/23/2012 Office Visit Written 06/23/2012 12:33 PM by Lendon Colonel, NP     Area is thoroughly examined with significant skin discoloration of the groin, penis and testicle. Equisitely painful with movement and palpations. Has been ruled out for psuedoanerym.   He has been placed on vicodin by ER. I will start him tramadol 50 mg every 6-8 hours around the clock for the next few days and then prn. He is to put ice/heat on the site for comfort.     Hyperlipidemia   Last Assessment & Plan   01/23/2013 Office Visit Written 01/24/2013  7:58 AM by Alycia Rossetti, MD     Complete lovaza then change to Fish oil Omega 3  2000mg  BID    Depression with anxiety   Last Assessment & Plan   10/21/2012 Office Visit Written 10/22/2012  7:37 AM by Alycia Rossetti, MD     Obtain records continue meds    Obesity, unspecified       Imaging: No results found.

## 2013-03-20 NOTE — Assessment & Plan Note (Addendum)
Very well-controlled currently. We will not make any changes in his medications. Dr. Hortencia Conradi  his primary care physician, is ordering his labs every 6 months.

## 2013-03-20 NOTE — Assessment & Plan Note (Signed)
He has had no recurrence of angina. He has not had to take any nitroglycerin sublingual. I expressed my concerns with him being on Viagra and Revatio, and also having nitroglycerin. I have advised him that he is not to use the sildenafil and nitroglycerin within 24 hours of taking one or the other. He has not needed to use nitroglycerin at all. I have asked him to speak with his primary care physician about the complexity of this medication. He verbalizes understanding.

## 2013-03-20 NOTE — Assessment & Plan Note (Signed)
Doing well on dual antiplatelet therapy. He is not having any side effects of bleeding. Will continue his current medication regimen. He will followup with Dr. Pennelope Bracken, at his request in the absence of Dr. Harrington Challenger, to be est. with a new cardiologist. His wife is also being seen by Dr. Pennelope Bracken.

## 2013-03-20 NOTE — Patient Instructions (Signed)
Your physician recommends that you schedule a follow-up appointment in: 6 months with Dr Koneswaran You will receive a reminder letter two months in advance reminding you to call and schedule your appointment. If you don't receive this letter, please contact our office.  Your physician recommends that you continue on your current medications as directed. Please refer to the Current Medication list given to you today.   

## 2013-04-24 ENCOUNTER — Ambulatory Visit (INDEPENDENT_AMBULATORY_CARE_PROVIDER_SITE_OTHER): Payer: Medicare Other | Admitting: Family Medicine

## 2013-04-24 ENCOUNTER — Encounter: Payer: Self-pay | Admitting: Family Medicine

## 2013-04-24 VITALS — BP 132/74 | HR 64 | Temp 98.4°F | Resp 12 | Ht 69.5 in | Wt 215.0 lb

## 2013-04-24 DIAGNOSIS — N529 Male erectile dysfunction, unspecified: Secondary | ICD-10-CM

## 2013-04-24 DIAGNOSIS — I2510 Atherosclerotic heart disease of native coronary artery without angina pectoris: Secondary | ICD-10-CM

## 2013-04-24 DIAGNOSIS — E785 Hyperlipidemia, unspecified: Secondary | ICD-10-CM

## 2013-04-24 DIAGNOSIS — I10 Essential (primary) hypertension: Secondary | ICD-10-CM

## 2013-04-24 LAB — COMPREHENSIVE METABOLIC PANEL
ALT: 25 U/L (ref 0–53)
AST: 20 U/L (ref 0–37)
Albumin: 4.2 g/dL (ref 3.5–5.2)
Alkaline Phosphatase: 43 U/L (ref 39–117)
BUN: 11 mg/dL (ref 6–23)
CALCIUM: 9.1 mg/dL (ref 8.4–10.5)
CHLORIDE: 104 meq/L (ref 96–112)
CO2: 27 mEq/L (ref 19–32)
Creat: 0.98 mg/dL (ref 0.50–1.35)
Glucose, Bld: 83 mg/dL (ref 70–99)
Potassium: 4.6 mEq/L (ref 3.5–5.3)
SODIUM: 140 meq/L (ref 135–145)
TOTAL PROTEIN: 7 g/dL (ref 6.0–8.3)
Total Bilirubin: 0.7 mg/dL (ref 0.2–1.2)

## 2013-04-24 LAB — LIPID PANEL
CHOLESTEROL: 90 mg/dL (ref 0–200)
HDL: 29 mg/dL — ABNORMAL LOW (ref >39)
LDL Cholesterol: 46 mg/dL (ref 0–99)
Total CHOL/HDL Ratio: 3.1 Ratio
Triglycerides: 77 mg/dL (ref <150)
VLDL: 15 mg/dL (ref 0–40)

## 2013-04-24 MED ORDER — SERTRALINE HCL 100 MG PO TABS: 50.0000 mg | ORAL_TABLET | Freq: Every day | ORAL | Status: DC

## 2013-04-24 MED ORDER — NITROGLYCERIN 0.4 MG SL SUBL: 0.4000 mg | SUBLINGUAL_TABLET | SUBLINGUAL | Status: DC | PRN

## 2013-04-24 MED ORDER — ATENOLOL 50 MG PO TABS: 50.0000 mg | ORAL_TABLET | Freq: Every day | ORAL | Status: DC

## 2013-04-24 MED ORDER — ATORVASTATIN CALCIUM 40 MG PO TABS: 20.0000 mg | ORAL_TABLET | Freq: Every day | ORAL | Status: DC

## 2013-04-24 MED ORDER — CLOPIDOGREL BISULFATE 75 MG PO TABS: 75.0000 mg | ORAL_TABLET | Freq: Every day | ORAL | Status: DC

## 2013-04-24 NOTE — Assessment & Plan Note (Signed)
Review cardiology note he is doing well.

## 2013-04-24 NOTE — Progress Notes (Signed)
Patient ID: Eddie Price, male   DOB: 1940/04/15, 73 y.o.   MRN: [id number]   Subjective:    Patient ID: Eddie Price, male    DOB: [birth year], 73 y.o.   MRN: [id number]  Patient presents for 3 month F/U  patient here follow chronic medical problems he has no specific concerns today. His medications were reviewed and refills. I reviewed his last cardiology note he did have an episode of angina which quickly resolved. He is due for repeat fasting labs for his cholesterol as his Lipitor was decreased at her last visit secondary to over treatment.    Review Of Systems:  GEN- denies fatigue, fever, weight loss,weakness, recent illness HEENT- denies eye drainage, change in vision, nasal discharge, CVS- denies chest pain, palpitations RESP- denies SOB, cough, wheeze ABD- denies N/V, change in stools, abd pain GU- denies dysuria, hematuria, dribbling, incontinence MSK- denies joint pain, muscle aches, injury Neuro- denies headache, dizziness, syncope, seizure activity       Objective:    BP 132/74  Pulse 64  Temp(Src) 98.4 F (36.9 C) (Oral)  Resp 12  Ht 5' 9.5" (1.765 m)  Wt 215 lb (97.523 kg)  BMI 31.31 kg/m2 GEN- NAD, alert and oriented x3 HEENT- PERRL, EOMI, non injected sclera, pink conjunctiva, MMM, oropharynx clear CVS- RRR, no murmur RESP-CTAB ABD-NABS,soft,NT,ND EXT- No edema Pulses- Radial, DP- 2+        Assessment & Plan:      Problem List Items Addressed This Visit   Impotence of organic origin     Viagra will be continued discussed the use of this with nitroglycerin which he is never required    Hyperlipidemia - Primary     Recheck fasting lipid panel at her last visit I decreased his Lipitor to 20 mg he is also on fish oil for his triglycerides    Relevant Medications      nitroGLYCERIN (NITROSTAT) SL tablet      atenolol (TENORMIN) tablet      atorvastatin (LIPITOR) tablet   Other Relevant Orders      Comprehensive metabolic panel (Completed)     Lipid panel (Completed)   Essential hypertension, benign     Blood pressure well controlled    Coronary atherosclerosis of native coronary artery     Review cardiology note he is doing well.       Note: This dictation was prepared with Dragon dictation along with smaller phrase technology. Any transcriptional errors that result from this process are unintentional.

## 2013-04-24 NOTE — Assessment & Plan Note (Signed)
Blood pressure well controlled

## 2013-04-24 NOTE — Assessment & Plan Note (Signed)
Recheck fasting lipid panel at her last visit I decreased his Lipitor to 20 mg he is also on fish oil for his triglycerides

## 2013-04-24 NOTE — Patient Instructions (Signed)
Continue current medications We will call with lab results  F/U 4 months  

## 2013-04-24 NOTE — Assessment & Plan Note (Signed)
Viagra will be continued discussed the use of this with nitroglycerin which he is never required

## 2013-08-29 ENCOUNTER — Encounter: Payer: Self-pay | Admitting: Family Medicine

## 2013-08-29 ENCOUNTER — Ambulatory Visit (INDEPENDENT_AMBULATORY_CARE_PROVIDER_SITE_OTHER): Payer: Medicare Other | Admitting: Family Medicine

## 2013-08-29 VITALS — BP 126/64 | HR 68 | Temp 98.0°F | Resp 12 | Ht 69.0 in | Wt 216.0 lb

## 2013-08-29 DIAGNOSIS — M179 Osteoarthritis of knee, unspecified: Secondary | ICD-10-CM | POA: Insufficient documentation

## 2013-08-29 DIAGNOSIS — M1710 Unilateral primary osteoarthritis, unspecified knee: Secondary | ICD-10-CM

## 2013-08-29 DIAGNOSIS — M7020 Olecranon bursitis, unspecified elbow: Secondary | ICD-10-CM

## 2013-08-29 DIAGNOSIS — I10 Essential (primary) hypertension: Secondary | ICD-10-CM

## 2013-08-29 DIAGNOSIS — M1712 Unilateral primary osteoarthritis, left knee: Secondary | ICD-10-CM

## 2013-08-29 DIAGNOSIS — M7021 Olecranon bursitis, right elbow: Secondary | ICD-10-CM

## 2013-08-29 DIAGNOSIS — E785 Hyperlipidemia, unspecified: Secondary | ICD-10-CM

## 2013-08-29 LAB — CBC WITH DIFFERENTIAL/PLATELET
Basophils Absolute: 0.1 10*3/uL (ref 0.0–0.1)
Basophils Relative: 1 % (ref 0–1)
EOS PCT: 5 % (ref 0–5)
Eosinophils Absolute: 0.3 10*3/uL (ref 0.0–0.7)
HCT: 45.9 % (ref 39.0–52.0)
Hemoglobin: 16 g/dL (ref 13.0–17.0)
LYMPHS ABS: 1.9 10*3/uL (ref 0.7–4.0)
LYMPHS PCT: 30 % (ref 12–46)
MCH: 32.7 pg (ref 26.0–34.0)
MCHC: 34.9 g/dL (ref 30.0–36.0)
MCV: 93.7 fL (ref 78.0–100.0)
Monocytes Absolute: 0.7 10*3/uL (ref 0.1–1.0)
Monocytes Relative: 11 % (ref 3–12)
Neutro Abs: 3.3 10*3/uL (ref 1.7–7.7)
Neutrophils Relative %: 53 % (ref 43–77)
Platelets: 229 10*3/uL (ref 150–400)
RBC: 4.9 MIL/uL (ref 4.22–5.81)
RDW: 13.9 % (ref 11.5–15.5)
WBC: 6.2 10*3/uL (ref 4.0–10.5)

## 2013-08-29 LAB — LIPID PANEL
Cholesterol: 111 mg/dL (ref 0–200)
HDL: 33 mg/dL — ABNORMAL LOW (ref >39)
LDL CALC: 55 mg/dL (ref 0–99)
Total CHOL/HDL Ratio: 3.4 Ratio
Triglycerides: 113 mg/dL (ref <150)
VLDL: 23 mg/dL (ref 0–40)

## 2013-08-29 LAB — COMPREHENSIVE METABOLIC PANEL
ALT: 32 U/L (ref 0–53)
AST: 22 U/L (ref 0–37)
Albumin: 4.4 g/dL (ref 3.5–5.2)
Alkaline Phosphatase: 45 U/L (ref 39–117)
BUN: 14 mg/dL (ref 6–23)
CALCIUM: 9.5 mg/dL (ref 8.4–10.5)
CHLORIDE: 103 meq/L (ref 96–112)
CO2: 25 meq/L (ref 19–32)
Creat: 1.05 mg/dL (ref 0.50–1.35)
Glucose, Bld: 94 mg/dL (ref 70–99)
Potassium: 5.1 mEq/L (ref 3.5–5.3)
SODIUM: 138 meq/L (ref 135–145)
TOTAL PROTEIN: 7.3 g/dL (ref 6.0–8.3)
Total Bilirubin: 0.7 mg/dL (ref 0.2–1.2)

## 2013-08-29 NOTE — Patient Instructions (Signed)
Continue current medications Referral to orthopedics  F/U January 2016

## 2013-08-29 NOTE — Progress Notes (Signed)
Patient ID: Eddie Price, male   DOB: 06/01/1940, 73 y.o.   MRN: [id number]   Subjective:    Patient ID: Eddie Price, male    DOB: 05-07-1940, 73 y.o.   MRN: [id number]  Patient presents for 4 month F/U and Knot to elbow  patient here follow chronic medical problems. His only concern is bursitis of his right elbow. He was seen at [facility] family medicine a little over a week ago at that time they actually did an aspiration however no culture was done. He states that the fluid was low TM colored they went ahead and put him on antibiotics which he's completing now. He will like to followup with orthopedics as he continues to have the swelling there's not been any significant change since the aspiration. He does have some tenderness if he rubs it against something. He does not remember any particular, to the elbow.  Knee pain he has history of osteoporosis and other joints he would like to see orthopedics for his knee as well as they have treated this before.  Hypertension coronary artery disease this is been stable he does not have any chest pain shortness of breath he is taking his medications as prescribed he is due for repeat fasting labs.  He has upcoming dental valuation as well as ophthalmology.    Review Of Systems:  GEN- denies fatigue, fever, weight loss,weakness, recent illness HEENT- denies eye drainage, change in vision, nasal discharge, CVS- denies chest pain, palpitations RESP- denies SOB, cough, wheeze ABD- denies N/V, change in stools, abd pain GU- denies dysuria, hematuria, dribbling, incontinence MSK- + joint pain, muscle aches, injury Neuro- denies headache, dizziness, syncope, seizure activity       Objective:    BP 126/64  Pulse 68  Temp(Src) 98 F (36.7 C) (Oral)  Resp 12  Ht 5\' 9"  (1.753 m)  Wt 216 lb (97.977 kg)  BMI 31.88 kg/m2 GEN- NAD, alert and oriented x3 HEENT- PERRL, EOMI, non injected sclera, pink conjunctiva, MMM, oropharynx clear Neck-  Supple, no thyromegaly CVS- RRR, no murmur RESP-CTAB MSK- Fair ROM bilat knees, normal inspection, no effusion EXT- No edema LE, Right elbow- swelling of olecranon bursa, mild erythema and warmth, NT Pulses- Radial, DP- 2+        Assessment & Plan:      Problem List Items Addressed This Visit   Olecranon bursitis     I will go ahead and set him up with his orthopedist. He is starting had aspiration is currently on cephelaxin, ICE, no NSAIDS due to plavix, cardiac disease    OA (osteoarthritis) of knee     Refer to ortho as they  Have seen him for this before    Hyperlipidemia     Recheck FLP, goal LDL < 100    Relevant Orders      Lipid panel   Essential hypertension, benign - Primary     Well controlled, no change to meds    Relevant Orders      CBC with Differential      Comprehensive metabolic panel      Lipid panel      Note: This dictation was prepared with Dragon dictation along with smaller phrase technology. Any transcriptional errors that result from this process are unintentional.

## 2013-08-29 NOTE — Assessment & Plan Note (Signed)
Well controlled, no change to meds 

## 2013-08-29 NOTE — Assessment & Plan Note (Signed)
I will go ahead and set him up with his orthopedist. He is starting had aspiration is currently on cephelaxin, ICE, no NSAIDS due to plavix, cardiac disease

## 2013-08-29 NOTE — Assessment & Plan Note (Signed)
Recheck FLP, goal LDL < 100

## 2013-08-29 NOTE — Assessment & Plan Note (Signed)
Refer to ortho as they  Have seen him for this before

## 2013-09-06 ENCOUNTER — Encounter: Payer: Self-pay | Admitting: Cardiovascular Disease

## 2013-09-06 ENCOUNTER — Ambulatory Visit (INDEPENDENT_AMBULATORY_CARE_PROVIDER_SITE_OTHER): Payer: Medicare Other | Admitting: Cardiovascular Disease

## 2013-09-06 VITALS — BP 106/72 | HR 62 | Ht 71.0 in | Wt 219.0 lb

## 2013-09-06 DIAGNOSIS — I25118 Atherosclerotic heart disease of native coronary artery with other forms of angina pectoris: Secondary | ICD-10-CM

## 2013-09-06 DIAGNOSIS — I2510 Atherosclerotic heart disease of native coronary artery without angina pectoris: Secondary | ICD-10-CM

## 2013-09-06 DIAGNOSIS — Z955 Presence of coronary angioplasty implant and graft: Secondary | ICD-10-CM

## 2013-09-06 DIAGNOSIS — Z9861 Coronary angioplasty status: Secondary | ICD-10-CM

## 2013-09-06 DIAGNOSIS — I10 Essential (primary) hypertension: Secondary | ICD-10-CM

## 2013-09-06 DIAGNOSIS — I209 Angina pectoris, unspecified: Secondary | ICD-10-CM

## 2013-09-06 DIAGNOSIS — E785 Hyperlipidemia, unspecified: Secondary | ICD-10-CM

## 2013-09-06 NOTE — Patient Instructions (Signed)
Your physician wants you to follow-up in: 6 months You will receive a reminder letter in the mail two months in advance. If you don't receive a letter, please call our office to schedule the follow-up appointment.    STOP Plavix     Thank you for choosing [facility] !

## 2013-09-06 NOTE — Progress Notes (Signed)
Patient ID: Eddie Price, male   DOB: 1940/02/12, 73 y.o.   MRN: [id number]      SUBJECTIVE: The patient is a 73 year old male with a history of coronary artery disease and underwent percutaneous coronary intervention to the ostium of a large dominant RCA on 06/17/2012. He had residual disease in a small caliber diagonal and obtuse marginal branches. He also has essential hypertension and hyperlipidemia.  He experiences a "chest sensation" approximately 2-3 times per week lasting seconds for which she has not had to use sublingual nitroglycerin. He may experience some exertional dyspnea if he overdoes it. He has some left knee arthritic problems and will find out next week if he requires surgery for this. He denies bleeding problems.   Review of Systems: As per "subjective", otherwise negative.  Allergies  Allergen Reactions  . Sulfa Antibiotics     RASH    Current Outpatient Prescriptions  Medication Sig Dispense Refill  . aspirin 81 MG tablet Take 81 mg by mouth daily.      Marland Kitchen atenolol (TENORMIN) 50 MG tablet Take 1 tablet (50 mg total) by mouth daily.  90 tablet  3  . atorvastatin (LIPITOR) 40 MG tablet Take 0.5 tablets (20 mg total) by mouth daily at 6 PM.  90 tablet  3  . clopidogrel (PLAVIX) 75 MG tablet Take 1 tablet (75 mg total) by mouth daily with breakfast.  90 tablet  3  . Multiple Vitamin (MULTIVITAMIN) capsule Take 1 capsule by mouth daily.      . Multiple Vitamins-Minerals (MULTIVITAMIN WITH MINERALS) tablet Take 1 tablet by mouth daily.      . nitroGLYCERIN (NITROSTAT) 0.4 MG SL tablet Place 1 tablet (0.4 mg total) under the tongue every 5 (five) minutes x 3 doses as needed for chest pain.  30 tablet  3  . Omega 3 1000 MG CAPS Take 2,000 mg by mouth 2 (two) times daily.      . sertraline (ZOLOFT) 100 MG tablet Take 0.5 tablets (50 mg total) by mouth daily.  90 tablet  3  . sildenafil (VIAGRA) 100 MG tablet Take 100 mg by mouth daily as needed for erectile dysfunction.       . vitamin E (VITAMIN E) 400 UNIT capsule Take 800 Units by mouth daily.       No current facility-administered medications for this visit.    Past Medical History  Diagnosis Date  . Hypertension   . Dysrhythmia   . H/O hiatal hernia   . Hyperlipidemia   . Depression   . CAD, multiple vessel     Past Surgical History  Procedure Laterality Date  . Complete colonoscopy  07-2010  . Knee surgery Right   . Choecystectomy    . Shoulder surgery Bilateral   . Total hip arthroplasty      Right  . Tonsillectomy    . Joint replacement    . Cholecystectomy    . Hernia repair    . Cardiac catheterization    . Fracture surgery    . Coronary stent placement  06/17/2012    History   Social History  . Marital Status: Married    Spouse Name: N/A    Number of Children: N/A  . Years of Education: N/A   Occupational History  . Not on file.   Social History Main Topics  . Smoking status: Former Smoker -- 1.00 packs/day for 10 years    Quit date: 06/15/1968  . Smokeless tobacco: Former Systems developer  Quit date: 01/27/1968  . Alcohol Use: Yes  . Drug Use: No  . Sexual Activity: Not on file   Other Topics Concern  . Not on file   Social History Narrative  . No narrative on file     Filed Vitals:   09/06/13 1033  BP: 106/72  Pulse: 62  Height: 5\' 11"  (1.803 m)  Weight: 219 lb (99.338 kg)  SpO2: 99%    PHYSICAL EXAM General: NAD Neck: No JVD, no thyromegaly. Lungs: Clear to auscultation bilaterally with normal respiratory effort. CV: Nondisplaced PMI.  Regular rate and rhythm, normal S1/S2, no S3/S4, no murmur. No pretibial or periankle edema.  No carotid bruit.  Normal pedal pulses.  Abdomen: Soft, nontender, no hepatosplenomegaly, no distention.  Neurologic: Alert and oriented x 3.  Psych: Normal affect. Extremities: No clubbing or cyanosis.   ECG: reviewed and available in electronic records.      ASSESSMENT AND PLAN: 1. CAD with RCA stent and residual disease in  small caliber diagonal and obtuse marginal branches: Symptomatically stable. Continue current therapy with ASA, atenolol and statin. Discontinue Plavix as it has been over 1 year since PCI.  2. Essential HTN: Controlled on present therapy.  3. Hyperlipidemia: Lipids on 08/29/13 TC 111, TG 113, HDL 33, LDL 55.  Dispo: f/u 6 months.  Kate Sable, M.D., F.A.C.C.

## 2014-01-04 ENCOUNTER — Encounter (HOSPITAL_COMMUNITY): Payer: Self-pay | Admitting: Cardiovascular Disease

## 2014-01-29 ENCOUNTER — Encounter: Payer: Medicare Other | Admitting: Family Medicine

## 2014-01-31 ENCOUNTER — Ambulatory Visit (INDEPENDENT_AMBULATORY_CARE_PROVIDER_SITE_OTHER): Payer: 59 | Admitting: Family Medicine

## 2014-01-31 ENCOUNTER — Encounter: Payer: Self-pay | Admitting: Family Medicine

## 2014-01-31 VITALS — BP 126/70 | HR 62 | Temp 98.7°F | Resp 14 | Ht 71.0 in | Wt 225.0 lb

## 2014-01-31 DIAGNOSIS — Z0000 Encounter for general adult medical examination without abnormal findings: Secondary | ICD-10-CM | POA: Diagnosis not present

## 2014-01-31 DIAGNOSIS — F418 Other specified anxiety disorders: Secondary | ICD-10-CM

## 2014-01-31 DIAGNOSIS — I25118 Atherosclerotic heart disease of native coronary artery with other forms of angina pectoris: Secondary | ICD-10-CM

## 2014-01-31 DIAGNOSIS — I10 Essential (primary) hypertension: Secondary | ICD-10-CM | POA: Diagnosis not present

## 2014-01-31 DIAGNOSIS — E669 Obesity, unspecified: Secondary | ICD-10-CM

## 2014-01-31 DIAGNOSIS — E785 Hyperlipidemia, unspecified: Secondary | ICD-10-CM | POA: Diagnosis not present

## 2014-01-31 DIAGNOSIS — Z125 Encounter for screening for malignant neoplasm of prostate: Secondary | ICD-10-CM

## 2014-01-31 DIAGNOSIS — I4891 Unspecified atrial fibrillation: Secondary | ICD-10-CM

## 2014-01-31 LAB — CBC WITH DIFFERENTIAL/PLATELET
Basophils Absolute: 0.1 10*3/uL (ref 0.0–0.1)
Basophils Relative: 1 % (ref 0–1)
EOS ABS: 0.4 10*3/uL (ref 0.0–0.7)
EOS PCT: 5 % (ref 0–5)
HCT: 47.7 % (ref 39.0–52.0)
HEMOGLOBIN: 16.2 g/dL (ref 13.0–17.0)
Lymphocytes Relative: 41 % (ref 12–46)
Lymphs Abs: 3.1 10*3/uL (ref 0.7–4.0)
MCH: 32.3 pg (ref 26.0–34.0)
MCHC: 34 g/dL (ref 30.0–36.0)
MCV: 95.2 fL (ref 78.0–100.0)
MPV: 9.3 fL (ref 8.6–12.4)
Monocytes Absolute: 0.8 10*3/uL (ref 0.1–1.0)
Monocytes Relative: 11 % (ref 3–12)
NEUTROS ABS: 3.2 10*3/uL (ref 1.7–7.7)
Neutrophils Relative %: 42 % — ABNORMAL LOW (ref 43–77)
Platelets: 227 10*3/uL (ref 150–400)
RBC: 5.01 MIL/uL (ref 4.22–5.81)
RDW: 13.8 % (ref 11.5–15.5)
WBC: 7.6 10*3/uL (ref 4.0–10.5)

## 2014-01-31 LAB — COMPREHENSIVE METABOLIC PANEL
ALBUMIN: 4.3 g/dL (ref 3.5–5.2)
ALK PHOS: 44 U/L (ref 39–117)
ALT: 26 U/L (ref 0–53)
AST: 21 U/L (ref 0–37)
BUN: 11 mg/dL (ref 6–23)
CO2: 24 mEq/L (ref 19–32)
CREATININE: 1.07 mg/dL (ref 0.50–1.35)
Calcium: 9.4 mg/dL (ref 8.4–10.5)
Chloride: 104 mEq/L (ref 96–112)
GLUCOSE: 94 mg/dL (ref 70–99)
Potassium: 4.6 mEq/L (ref 3.5–5.3)
Sodium: 139 mEq/L (ref 135–145)
Total Bilirubin: 0.8 mg/dL (ref 0.2–1.2)
Total Protein: 7.1 g/dL (ref 6.0–8.3)

## 2014-01-31 LAB — LIPID PANEL
Cholesterol: 108 mg/dL (ref 0–200)
HDL: 30 mg/dL — AB (ref >39)
LDL CALC: 53 mg/dL (ref 0–99)
Total CHOL/HDL Ratio: 3.6 Ratio
Triglycerides: 123 mg/dL (ref <150)
VLDL: 25 mg/dL (ref 0–40)

## 2014-01-31 LAB — TSH: TSH: 1.279 u[IU]/mL (ref 0.350–4.500)

## 2014-01-31 MED ORDER — SERTRALINE HCL 25 MG PO TABS
25.0000 mg | ORAL_TABLET | Freq: Every day | ORAL | Status: DC
Start: 2014-01-31 — End: 2014-03-23

## 2014-01-31 NOTE — Assessment & Plan Note (Signed)
Will give trial off zoloft, start taper, currently on 50mg > 25mg  x 4 weeks, then 12.5mg  x 4 weeks, then stop

## 2014-01-31 NOTE — Assessment & Plan Note (Signed)
New onset Afib, no RVR, check lytes, TSH, history of CAD with stenting. Currently asymptomatic. I wonder if this episodes when he has short-lived chest pain or palpitations may be a paroxysmal A. Fib. He is currently on aspirin. I was planning to restart his Plavix since he had this his home but we have appointed with cardiology tomorrow. I did give him red flags for tonight if anything changes. He may need repeat echocardiogram I will defer workup to cardiology

## 2014-01-31 NOTE — Patient Instructions (Addendum)
Start zoloft 25mg  once a day  For 4 weeks Then cut down to zoloft 12.5mg  ( 1/2 tablet) for 4 weeks, then stop We will call with lab results Restart plavix for now Cardiology appt  Tomorrow at 3:40pm- with Dr. Bronson Ing F/U 4 months

## 2014-01-31 NOTE — Assessment & Plan Note (Signed)
Well controlled, continue atenolol

## 2014-01-31 NOTE — Progress Notes (Signed)
Patient ID: Eddie Price, male   DOB: 15-Oct-1940, 74 y.o.   MRN: [id number] Subjective:   Patient presents for Medicare Annual/Subsequent preventive examination.   Pt here for wellness physical. No concerns, feels good.   Seen by  Cards in aug, good check up, occasional has sharp " zinger" chest pain and occ SOB but last a few seconds only and does not intefere with activities.    Would like to come off zoloft, was on due to stress with work, feels like he can manage now he is retired   Review Past Medical/Family/Social: per EMR   Risk Factors  Current exercise habits: walks some Dietary issues discussed: yes  Cardiac risk factors: Obesity (BMI >= 30 kg/m2). , CAD  Depression Screen  (Note: if answer to either of the following is "Yes", a more complete depression screening is indicated)  Over the past two weeks, have you felt down, depressed or hopeless? No Over the past two weeks, have you felt little interest or pleasure in doing things? No Have you lost interest or pleasure in daily life? No Do you often feel hopeless? No Do you cry easily over simple problems? No   Activities of Daily Living  In your present state of health, do you have any difficulty performing the following activities?:  Driving? No  Managing money? No  Feeding yourself? No  Getting from bed to chair? No  Climbing a flight of stairs? No  Preparing food and eating?: No  Bathing or showering? No  Getting dressed: No  Getting to the toilet? No  Using the toilet:No  Moving around from place to place: No  In the past year have you fallen or had a near fall?:No  Are you sexually active? yes Do you have more than one partner? No   Hearing Difficulties: No  Do you often ask people to speak up or repeat themselves? No  Do you experience ringing or noises in your ears? No Do you have difficulty understanding soft or whispered voices? No  Do you feel that you have a problem with memory? No Do you often  misplace items? No  Do you feel safe at home? Yes  Cognitive Testing  Alert? Yes Normal Appearance?Yes  Oriented to person? Yes Place? Yes  Time? Yes  Recall of three objects? Yes  Can perform simple calculations? Yes  Displays appropriate judgment?Yes  Can read the correct time from a watch face?Yes   List the Names of Other Physician/Practitioners you currently use:    - Cardiology - [place] as needed  Screening Tests / Date: UP TO DATE, SEE EMR Colonoscopy                     Zostavax  Influenza Vaccine  Tetanus/tdap  ROS: GEN- denies fatigue, fever, weight loss,weakness, recent illness HEENT- denies eye drainage, change in vision, nasal discharge, CVS- occ chest pain, denies palpitations RESP- denies SOB, cough, wheeze ABD- denies N/V, change in stools, abd pain GU- denies dysuria, hematuria, dribbling, incontinence MSK- denies joint pain, muscle aches, injury Neuro- denies headache, dizziness, syncope, seizure activity   PHYSICAL GEN- NAD, alert and oriented x3 HEENT- PERRL, EOMI, non injected sclera, pink conjunctiva, MMM, oropharynx clear Neck- Supple, no thryomegaly, no carotid bruit CVS- irregular rhythem, normal rate, no murmur RESP-CTAB ABD-NABS,soft,NT,ND EXT- No edema Pulses- Radial, DP- 2+  EKG- A fib, HR 69, normal EKG in Feb   Assessment:    Annual  wellness medicare exam   Plan:    During the course of the visit the patient was educated and counseled about appropriate screening and preventive services including:   Diet review for nutrition referral? Yes ____ Not Indicated __x__  Patient Instructions (the written plan) was given to the patient.  Medicare Attestation  I have personally reviewed:  The patient's medical and social history  Their use of alcohol, tobacco or illicit drugs  Their current medications and supplements  The patient's functional ability including ADLs,fall risks, home safety  risks, cognitive, and hearing and visual impairment  Diet and physical activities  Evidence for depression or mood disorders  The patient's weight, height, BMI, and visual acuity have been recorded in the chart. I have made referrals, counseling, and provided education to the patient based on review of the above and I have provided the patient with a written personalized care plan for preventive services.

## 2014-02-01 ENCOUNTER — Encounter: Payer: Self-pay | Admitting: Cardiovascular Disease

## 2014-02-01 ENCOUNTER — Ambulatory Visit (INDEPENDENT_AMBULATORY_CARE_PROVIDER_SITE_OTHER): Payer: Medicare Other | Admitting: Cardiovascular Disease

## 2014-02-01 VITALS — BP 126/82 | HR 47 | Ht 71.0 in | Wt 227.8 lb

## 2014-02-01 DIAGNOSIS — I10 Essential (primary) hypertension: Secondary | ICD-10-CM | POA: Diagnosis not present

## 2014-02-01 DIAGNOSIS — I2510 Atherosclerotic heart disease of native coronary artery without angina pectoris: Secondary | ICD-10-CM | POA: Diagnosis not present

## 2014-02-01 DIAGNOSIS — Z955 Presence of coronary angioplasty implant and graft: Secondary | ICD-10-CM

## 2014-02-01 DIAGNOSIS — I4891 Unspecified atrial fibrillation: Secondary | ICD-10-CM

## 2014-02-01 DIAGNOSIS — R0609 Other forms of dyspnea: Secondary | ICD-10-CM

## 2014-02-01 DIAGNOSIS — E785 Hyperlipidemia, unspecified: Secondary | ICD-10-CM

## 2014-02-01 LAB — PSA, MEDICARE: PSA: 0.93 ng/mL (ref <=4.00)

## 2014-02-01 MED ORDER — APIXABAN 5 MG PO TABS: 5.0000 mg | ORAL_TABLET | Freq: Two times a day (BID) | ORAL | Status: DC

## 2014-02-01 NOTE — Patient Instructions (Signed)
Your physician recommends that you schedule a follow-up appointment in: [place]  Your physician has requested that you have an echocardiogram. Echocardiography is a painless test that uses sound waves to create images of your heart. It provides your doctor with information about the size and shape of your heart and how well your heart's chambers and valves are working. This procedure takes approximately one hour. There are no restrictions for this procedure.   Your physician has recommended that you wear an event monitor FOR 1 WEEK. Event monitors are medical devices that record the heart's electrical activity. Doctors most often [place] these monitors to diagnose arrhythmias. Arrhythmias are problems with the speed or rhythm of the heartbeat. The monitor is a small, portable device. You can wear one while you do your normal daily activities. This is usually used to diagnose what is causing palpitations/syncope (passing out).  Your physician has recommended you make the following change in your medication:   START ELIQUIS 5 MG TWICE DAILY. WE HAVE GIVEN YOU A DISCOUNT CARD  Thank you for choosing [facility]!!

## 2014-02-01 NOTE — Progress Notes (Signed)
Patient ID: Eddie Price, male   DOB: Nov 17, 1940, 74 y.o.   MRN: [id number]      SUBJECTIVE: The patient is a 74 year old male with a history of coronary artery disease and underwent percutaneous coronary intervention to the ostium of a large dominant RCA on 06/17/2012. He had residual disease in a small caliber diagonal and obtuse marginal branches. He also has essential hypertension and hyperlipidemia. He saw his PCP, Dr. Buelah Manis, yesterday who diagnosed him with new-onset atrial fibrillation. ECG performed on 01/31/2014 demonstrated atrial fibrillation, heart rate 69 bpm. A series of labs were done which demonstrated total cholesterol 108, triglycerides 123, HDL 30, LDL 53, TSH 1.2, with a normal basic metabolic panel and an unremarkable CBC. He occasionally experiences what he terms a "zinger" which lasts seconds and spontaneously resolves. His wife notes that he has become more dyspneic with exertion. He denies lightheadedness, palpitations, leg swelling, orthopnea, PND, and syncope. He and his wife will be celebrating their 50th wedding anniversary this July.  Review of Systems: As per "subjective", otherwise negative.  Allergies  Allergen Reactions  . Sulfa Antibiotics     RASH    Current Outpatient Prescriptions  Medication Sig Dispense Refill  . aspirin 81 MG tablet Take 81 mg by mouth daily.    Marland Kitchen atenolol (TENORMIN) 50 MG tablet Take 1 tablet (50 mg total) by mouth daily. 90 tablet 3  . atorvastatin (LIPITOR) 40 MG tablet Take 0.5 tablets (20 mg total) by mouth daily at 6 PM. 90 tablet 3  . Multiple Vitamin (MULTIVITAMIN) capsule Take 1 capsule by mouth daily.    . nitroGLYCERIN (NITROSTAT) 0.4 MG SL tablet Place 1 tablet (0.4 mg total) under the tongue every 5 (five) minutes x 3 doses as needed for chest pain. 30 tablet 3  . Omega 3 1000 MG CAPS Take 2,000 mg by mouth 2 (two) times daily.    . sertraline (ZOLOFT) 25 MG tablet Take 1 tablet (25 mg total) by mouth daily. 30  tablet 1  . vitamin E (VITAMIN E) 400 UNIT capsule Take 800 Units by mouth daily.     No current facility-administered medications for this visit.    Past Medical History  Diagnosis Date  . Hypertension   . Dysrhythmia   . H/O hiatal hernia   . Hyperlipidemia   . Depression   . CAD, multiple vessel     Past Surgical History  Procedure Laterality Date  . Complete colonoscopy  07-2010  . Knee surgery Right   . Choecystectomy    . Shoulder surgery Bilateral   . Total hip arthroplasty      Right  . Tonsillectomy    . Joint replacement    . Cholecystectomy    . Hernia repair    . Cardiac catheterization    . Fracture surgery    . Coronary stent placement  06/17/2012  . Percutaneous coronary stent intervention (pci-s) N/A 06/17/2012    Procedure: PERCUTANEOUS CORONARY STENT INTERVENTION (PCI-S);  Surgeon: Burnell Blanks, MD;  Location: [facility] CATH LAB;  Service: Cardiovascular;  Laterality: N/A;    History   Social History  . Marital Status: Married    Spouse Name: N/A    Number of Children: N/A  . Years of Education: N/A   Occupational History  . Not on file.   Social History Main Topics  . Smoking status: Former Smoker -- 1.00 packs/day for 10 years    Start date: 02/02/1959    Quit date: 06/15/1968  .  Smokeless tobacco: Former Systems developer    Quit date: 01/27/1968  . Alcohol Use: 0.0 oz/week    0 Not specified per week  . Drug Use: No  . Sexual Activity: Not on file   Other Topics Concern  . Not on file   Social History Narrative     Filed Vitals:   02/01/14 1555  BP: 126/82  Pulse: 47  Height: 5\' 11"  (2.023 m)  Weight: 227 lb 12.8 oz (103.329 kg)  SpO2: 99%   HR by auscultation: 60-70 bpm  PHYSICAL EXAM General: NAD HEENT: Normal. Neck: No JVD, no thyromegaly. Lungs: Clear to auscultation bilaterally with normal respiratory effort. CV: Nondisplaced PMI.  Irregular rhythm, normal S1/S2, no S3, no murmur. No pretibial or periankle edema.  No carotid  bruit.  Abdomen: Soft, nontender, no distention.  Neurologic: Alert and oriented x 3.  Psych: Normal affect. Skin: Normal. Musculoskeletal: No gross deformities. Extremities: No clubbing or cyanosis.   ECG: Most recent ECG reviewed.      ASSESSMENT AND PLAN: 1. CAD with RCA stent and residual disease in small caliber diagonal and obtuse marginal branches: Symptomatically stable. Continue current therapy with ASA, atenolol and statin.  2. Essential HTN: Controlled on present therapy. No changes. 3. Hyperlipidemia: Lipids on 08/29/13 TC 111, TG 113, HDL 33, LDL 55. Continue present dose of statin. 4. New-onset atrial fibrillation: HR is controlled on atenolol 50 mg daily. In order to better assess his HR, I will obtain a one week event monitor to see if his symptoms of exertional dyspnea correlate with an elevated HR. I will obtain an echocardiogram to assess LV function and left atrial size. CHADS-VASC score is 3, thus anticoagulation is indicated to reduce thromboembolic risk. Will start therapy with apixaban 5 mg bid.  Dispo: f/u 2 months.   Kate Sable, M.D., F.A.C.C.

## 2014-02-02 ENCOUNTER — Telehealth: Payer: Self-pay | Admitting: *Deleted

## 2014-02-02 NOTE — Telephone Encounter (Signed)
Pt can not afford eliquis what are his options? Pt states that he is okay with warfarin/tmj

## 2014-02-02 NOTE — Telephone Encounter (Signed)
Contacting Eliquis rep to see if there's anything they can do to help. Will let you know. In the meantime, please give 2 weeks of samples.

## 2014-02-02 NOTE — Telephone Encounter (Signed)
Spoke with pt and mailed assistance form today. Pt will come by [place] office on Monday and pick up samples of Eliquis and 30 day free card.

## 2014-02-02 NOTE — Telephone Encounter (Signed)
Will forward to Dr. Koneswaran  

## 2014-02-08 ENCOUNTER — Ambulatory Visit (HOSPITAL_COMMUNITY)
Admission: RE | Admit: 2014-02-08 | Discharge: 2014-02-08 | Disposition: A | Discharge status: Home / Self Care | Payer: Medicare Other | Source: Ambulatory Visit | Attending: Cardiovascular Disease | Admitting: Cardiovascular Disease

## 2014-02-08 DIAGNOSIS — I059 Rheumatic mitral valve disease, unspecified: Secondary | ICD-10-CM | POA: Diagnosis not present

## 2014-02-08 DIAGNOSIS — I4891 Unspecified atrial fibrillation: Secondary | ICD-10-CM | POA: Insufficient documentation

## 2014-02-08 DIAGNOSIS — I10 Essential (primary) hypertension: Secondary | ICD-10-CM | POA: Insufficient documentation

## 2014-02-08 DIAGNOSIS — Z87891 Personal history of nicotine dependence: Secondary | ICD-10-CM | POA: Insufficient documentation

## 2014-02-08 DIAGNOSIS — E785 Hyperlipidemia, unspecified: Secondary | ICD-10-CM | POA: Insufficient documentation

## 2014-02-08 HISTORY — PX: TRANSTHORACIC ECHOCARDIOGRAM: SHX275

## 2014-02-08 NOTE — Progress Notes (Signed)
  Echocardiogram 2D Echocardiogram has been performed.  Leming, [facility] 02/08/2014, 11:50 AM

## 2014-02-12 ENCOUNTER — Telehealth: Payer: Self-pay | Admitting: *Deleted

## 2014-02-12 NOTE — Telephone Encounter (Signed)
LM on pts VM. Forwarded to Dr. Buelah Manis

## 2014-02-12 NOTE — Telephone Encounter (Signed)
-----   Message from Herminio Commons, MD sent at 02/12/2014 12:00 PM EST ----- Normal pumping function, no significant valve tightening or leakage.

## 2014-02-13 ENCOUNTER — Telehealth: Payer: Self-pay | Admitting: *Deleted

## 2014-02-13 NOTE — Telephone Encounter (Signed)
Pt has got paperwork from his [place] that they will need more information to be able to continue covering his medication. He is going to drop papers off this afternoon. tmj

## 2014-02-19 ENCOUNTER — Telehealth: Payer: Self-pay

## 2014-02-19 NOTE — Telephone Encounter (Signed)
Placed on Dr.Koneswaran's desk for reading EOS report

## 2014-02-22 ENCOUNTER — Other Ambulatory Visit: Payer: Self-pay | Admitting: *Deleted

## 2014-02-22 ENCOUNTER — Telehealth: Payer: Self-pay

## 2014-02-22 DIAGNOSIS — I4891 Unspecified atrial fibrillation: Secondary | ICD-10-CM

## 2014-02-22 NOTE — Telephone Encounter (Signed)
Pt notified of event monitor results   Per Dr Bronson Ing: Sx's correlated with both controlled and elevated HR.Average HR 77 bpm demonstrating overall good control     Routed to pcp

## 2014-02-22 NOTE — Telephone Encounter (Signed)
Received in mail today patients completed portion of assistance form for Eliquis,will fill out our part and have Dr.Koneswaran sign

## 2014-02-26 ENCOUNTER — Other Ambulatory Visit: Payer: Self-pay | Admitting: *Deleted

## 2014-02-26 MED ORDER — ATENOLOL 50 MG PO TABS: 50.0000 mg | ORAL_TABLET | Freq: Every day | ORAL | Status: DC

## 2014-02-26 NOTE — Telephone Encounter (Signed)
Received fax requesting refill on Atenolol.   Refill appropriate and filled per protocol.

## 2014-03-15 ENCOUNTER — Telehealth: Payer: Self-pay | Admitting: *Deleted

## 2014-03-15 NOTE — Telephone Encounter (Signed)
Need pre-auth on eliquis

## 2014-03-23 ENCOUNTER — Telehealth: Payer: Self-pay | Admitting: Family Medicine

## 2014-03-23 MED ORDER — SERTRALINE HCL 100 MG PO TABS: 100.0000 mg | ORAL_TABLET | Freq: Every day | ORAL | Status: DC

## 2014-03-23 NOTE — Telephone Encounter (Signed)
Prescription sent to pharmacy. .   Call placed to patient and patient made aware.  

## 2014-03-23 NOTE — Telephone Encounter (Signed)
Call placed to patient.   Reports that he is taking Zoloft 25mg , but he is feeling that it is ineffective at this time.   Requested to increase back to Zoloft 50mg  PO QD. States that he would like to get the 100mg  tabs and cut them in half like he was doing before.   MD please advise.

## 2014-03-23 NOTE — Telephone Encounter (Signed)
Okay to increase medication, okay to dispense 100mg  and cut in half

## 2014-03-23 NOTE — Telephone Encounter (Signed)
[place] Pt is wanting to go back up on his zoloft (please call him about this before refilling) he was saying something about 100 and cutting it in half because it was cheaper that a way

## 2014-03-26 NOTE — Telephone Encounter (Signed)
Prior authorization number [id number]

## 2014-04-09 ENCOUNTER — Encounter: Payer: Self-pay | Admitting: Cardiovascular Disease

## 2014-04-09 ENCOUNTER — Ambulatory Visit (INDEPENDENT_AMBULATORY_CARE_PROVIDER_SITE_OTHER): Payer: Medicare Other | Admitting: Cardiovascular Disease

## 2014-04-09 ENCOUNTER — Telehealth: Payer: Self-pay | Admitting: Family Medicine

## 2014-04-09 VITALS — BP 112/74 | HR 76 | Ht 71.0 in | Wt 227.0 lb

## 2014-04-09 DIAGNOSIS — Z955 Presence of coronary angioplasty implant and graft: Secondary | ICD-10-CM | POA: Diagnosis not present

## 2014-04-09 DIAGNOSIS — E785 Hyperlipidemia, unspecified: Secondary | ICD-10-CM | POA: Diagnosis not present

## 2014-04-09 DIAGNOSIS — M1712 Unilateral primary osteoarthritis, left knee: Secondary | ICD-10-CM | POA: Diagnosis not present

## 2014-04-09 DIAGNOSIS — Q231 Congenital insufficiency of aortic valve: Secondary | ICD-10-CM

## 2014-04-09 DIAGNOSIS — I10 Essential (primary) hypertension: Secondary | ICD-10-CM | POA: Diagnosis not present

## 2014-04-09 DIAGNOSIS — I2510 Atherosclerotic heart disease of native coronary artery without angina pectoris: Secondary | ICD-10-CM

## 2014-04-09 DIAGNOSIS — I4891 Unspecified atrial fibrillation: Secondary | ICD-10-CM

## 2014-04-09 DIAGNOSIS — M79644 Pain in right finger(s): Secondary | ICD-10-CM | POA: Diagnosis not present

## 2014-04-09 MED ORDER — APIXABAN 5 MG PO TABS: 5.0000 mg | ORAL_TABLET | Freq: Two times a day (BID) | ORAL | Status: DC

## 2014-04-09 NOTE — Telephone Encounter (Signed)
[phone] PT states he had his [place] Vaccine in December at [facility] in [place]

## 2014-04-09 NOTE — Patient Instructions (Signed)
Your physician wants you to follow-up in: the end of June You will receive a reminder letter in the mail two months in advance. If you don't receive a letter, please call our office to schedule the follow-up appointment.  I have given you a 90 day supply of Eliquis     Thank you for choosing [facility] !

## 2014-04-09 NOTE — Progress Notes (Signed)
Patient ID: Eddie Price, male   DOB: 08-Apr-1940, 74 y.o.   MRN: [id number]      SUBJECTIVE: The patient presents for follow up of atrial fibrillation. He also has a history of coronary artery disease and underwent percutaneous coronary intervention to the ostium of a large dominant RCA on 06/17/2012. He had residual disease in a small caliber diagonal and obtuse marginal branches. He also has essential hypertension and hyperlipidemia.  He underwent event monitoring which demonstrated controlled atrial fibrillation, average heart rate 77 bpm. He had symptoms of palpitations, chest pain and pressure which correlated with heart rates in the 110-120 bpm range and once when it was in the 60 beat per minute range.  Echocardiography demonstrated normal left ventricular systolic function, EF 09-32%, mild basal septal hypertrophy, increased left atrial pressure, moderate to severe left atrial enlargement, mild mitral regurgitation, and possibly a bicuspid aortic valve without stenosis with trivial regurgitation and a mildly ectatic aorta with moderate right atrial enlargement.  He has been feeling well and seldom has palpitations.  He and his wife are going to [place] in July to celebrate their 50th anniversary.  Review of Systems: As per "subjective", otherwise negative.  Allergies  Allergen Reactions  . Sulfa Antibiotics     RASH    Current Outpatient Prescriptions  Medication Sig Dispense Refill  . apixaban (ELIQUIS) 5 MG TABS tablet Take 1 tablet (5 mg total) by mouth 2 (two) times daily. 60 tablet 3  . aspirin 81 MG tablet Take 81 mg by mouth daily.    Marland Kitchen atenolol (TENORMIN) 50 MG tablet Take 1 tablet (50 mg total) by mouth daily. 90 tablet 3  . atorvastatin (LIPITOR) 40 MG tablet Take 0.5 tablets (20 mg total) by mouth daily at 6 PM. 90 tablet 3  . Multiple Vitamin (MULTIVITAMIN) capsule Take 1 capsule by mouth daily.    . nitroGLYCERIN (NITROSTAT) 0.4 MG SL tablet Place 1 tablet (0.4 mg  total) under the tongue every 5 (five) minutes x 3 doses as needed for chest pain. 30 tablet 3  . Omega 3 1000 MG CAPS Take 2,000 mg by mouth 2 (two) times daily.    . sertraline (ZOLOFT) 100 MG tablet Take 1 tablet (100 mg total) by mouth daily. 90 tablet 0  . vitamin E (VITAMIN E) 400 UNIT capsule Take 800 Units by mouth daily.     No current facility-administered medications for this visit.    Past Medical History  Diagnosis Date  . Hypertension   . Dysrhythmia   . H/O hiatal hernia   . Hyperlipidemia   . Depression   . CAD, multiple vessel     Past Surgical History  Procedure Laterality Date  . Complete colonoscopy  07-2010  . Knee surgery Right   . Choecystectomy    . Shoulder surgery Bilateral   . Total hip arthroplasty      Right  . Tonsillectomy    . Joint replacement    . Cholecystectomy    . Hernia repair    . Cardiac catheterization    . Fracture surgery    . Coronary stent placement  06/17/2012  . Percutaneous coronary stent intervention (pci-s) N/A 06/17/2012    Procedure: PERCUTANEOUS CORONARY STENT INTERVENTION (PCI-S);  Surgeon: Burnell Blanks, MD;  Location: [facility] CATH LAB;  Service: Cardiovascular;  Laterality: N/A;    History   Social History  . Marital Status: Married    Spouse Name: N/A  . Number of Children: N/A  .  Years of Education: N/A   Occupational History  . Not on file.   Social History Main Topics  . Smoking status: Former Smoker -- 1.00 packs/day for 10 years    Start date: 02/02/1959    Quit date: 06/15/1968  . Smokeless tobacco: Former Systems developer    Quit date: 01/27/1968  . Alcohol Use: 0.0 oz/week    0 Standard drinks or equivalent per week  . Drug Use: No  . Sexual Activity: Not on file   Other Topics Concern  . Not on file   Social History Narrative     Filed Vitals:   04/09/14 1046  Pulse: 76  Height: 5\' 11"  (1.803 m)  Weight: 227 lb (102.967 kg)  SpO2: 98%   BP 112/74   PHYSICAL EXAM General: NAD HEENT:  Normal. Neck: No JVD, no thyromegaly. Lungs: Clear to auscultation bilaterally with normal respiratory effort. CV: Nondisplaced PMI. Irregular rhythm, normal S1/S2, no S3, no murmur. No pretibial or periankle edema. No carotid bruit.  Abdomen: Soft, nontender, no distention.  Neurologic: Alert and oriented x 3.  Psych: Normal affect. Skin: Normal. Musculoskeletal: No gross deformities. Extremities: No clubbing or cyanosis.   ECG: Most recent ECG reviewed.      ASSESSMENT AND PLAN: 1. CAD with RCA stent and residual disease in small caliber diagonal and obtuse marginal branches: Symptomatically stable. Continue current therapy with ASA, atenolol and statin.  2. Essential HTN: Controlled on present therapy. No changes. 3. Hyperlipidemia: Lipids on 08/29/13 TC 111, TG 113, HDL 33, LDL 55. Continue present dose of statin. 4. Atrial fibrillation: HR is controlled on atenolol 50 mg daily. Event monitoring demonstrated overall good rate control. LV function was normal with moderate to severe left atrial enlargement, indicating a low likelihood of maintaining a normal sinus rhythm. CHADS-VASC score is 3, thus anticoagulation is indicated to reduce thromboembolic risk. Will continue therapy with apixaban 5 mg bid and provide a 90-day supply with refills. 5. Possible bicuspid aortic valve: No need for additional imaging at this time. Would consider repeat echocardiogram in the future when deemed clinically indicated.  Dispo: f/u in June.  Kate Sable, M.D., F.A.C.C.

## 2014-04-10 NOTE — Telephone Encounter (Signed)
Noted and documented

## 2014-04-13 ENCOUNTER — Other Ambulatory Visit (HOSPITAL_COMMUNITY): Payer: Self-pay | Admitting: Specialist

## 2014-04-13 DIAGNOSIS — M1712 Unilateral primary osteoarthritis, left knee: Secondary | ICD-10-CM

## 2014-04-16 DIAGNOSIS — M65311 Trigger thumb, right thumb: Secondary | ICD-10-CM | POA: Diagnosis not present

## 2014-04-17 DIAGNOSIS — L821 Other seborrheic keratosis: Secondary | ICD-10-CM | POA: Diagnosis not present

## 2014-04-17 DIAGNOSIS — L570 Actinic keratosis: Secondary | ICD-10-CM | POA: Diagnosis not present

## 2014-04-17 DIAGNOSIS — L814 Other melanin hyperpigmentation: Secondary | ICD-10-CM | POA: Diagnosis not present

## 2014-04-17 DIAGNOSIS — D225 Melanocytic nevi of trunk: Secondary | ICD-10-CM | POA: Diagnosis not present

## 2014-04-24 ENCOUNTER — Ambulatory Visit (HOSPITAL_COMMUNITY)
Admission: RE | Admit: 2014-04-24 | Discharge: 2014-04-24 | Disposition: A | Discharge status: Home / Self Care | Payer: Medicare Other | Source: Ambulatory Visit | Attending: Specialist | Admitting: Specialist

## 2014-04-24 DIAGNOSIS — M7122 Synovial cyst of popliteal space [Baker], left knee: Secondary | ICD-10-CM | POA: Diagnosis not present

## 2014-04-24 DIAGNOSIS — M1712 Unilateral primary osteoarthritis, left knee: Secondary | ICD-10-CM | POA: Insufficient documentation

## 2014-04-24 DIAGNOSIS — M23342 Other meniscus derangements, anterior horn of lateral meniscus, left knee: Secondary | ICD-10-CM | POA: Diagnosis not present

## 2014-04-30 DIAGNOSIS — S83282D Other tear of lateral meniscus, current injury, left knee, subsequent encounter: Secondary | ICD-10-CM | POA: Diagnosis not present

## 2014-04-30 DIAGNOSIS — M1712 Unilateral primary osteoarthritis, left knee: Secondary | ICD-10-CM | POA: Diagnosis not present

## 2014-05-02 ENCOUNTER — Encounter (HOSPITAL_BASED_OUTPATIENT_CLINIC_OR_DEPARTMENT_OTHER): Payer: Self-pay | Admitting: *Deleted

## 2014-05-04 ENCOUNTER — Encounter (HOSPITAL_BASED_OUTPATIENT_CLINIC_OR_DEPARTMENT_OTHER): Payer: Self-pay | Admitting: *Deleted

## 2014-05-04 NOTE — Progress Notes (Addendum)
NPO AFTER MN WITH EXCEPTION CLEAR LIQUIDS UNTIL 0700 (NO CREAM/ MILD  PRODUCTS).  ARRIVE AT 1100. NEEDS ISTAT.  CURRENT EKG IN CHART AND EPIC. WILL TAKE ZOLOFT AND ATENOLOL AM DOS W/ SIPS OF WATER.  PT NOT GIVEN INSTRUCTIONS OF ELIQUIS MEDICATION. CALLING OFFICE NOW, CHECKING TO SEE IF CARDIAC CLEARANCE/ AND OK TO STOP ELIQUIS FOR SURG.  SPOKE W/ WENDY , OR SCHEDULER AT OFFICE, SHE TO GET BACK WITH ME TODAY WITH INSTRUCTIONS AND ALSO CALL THE PT.

## 2014-05-04 NOTE — Progress Notes (Signed)
   05/04/14 1224  OBSTRUCTIVE SLEEP APNEA  Have you ever been diagnosed with sleep apnea through a sleep study? No  Do you snore loudly (loud enough to be heard through closed doors)?  0  Do you often feel tired, fatigued, or sleepy during the daytime? 0  Has anyone observed you stop breathing during your sleep? 0  Do you have, or are you being treated for high blood pressure? 1  BMI more than 35 kg/m2? 0  Age over 72 years old? 1  Neck circumference greater than 40 cm/16 inches? 1  Gender: 1  Obstructive Sleep Apnea Score 4

## 2014-05-08 ENCOUNTER — Encounter (HOSPITAL_BASED_OUTPATIENT_CLINIC_OR_DEPARTMENT_OTHER): Payer: Self-pay

## 2014-05-08 ENCOUNTER — Other Ambulatory Visit: Payer: Self-pay | Admitting: Orthopedic Surgery

## 2014-05-08 ENCOUNTER — Encounter (HOSPITAL_BASED_OUTPATIENT_CLINIC_OR_DEPARTMENT_OTHER)
Admission: RE | Disposition: A | Discharge status: Home / Self Care | Payer: Self-pay | Source: Ambulatory Visit | Attending: Specialist

## 2014-05-08 ENCOUNTER — Ambulatory Visit (HOSPITAL_BASED_OUTPATIENT_CLINIC_OR_DEPARTMENT_OTHER): Payer: Medicare Other | Admitting: Anesthesiology

## 2014-05-08 ENCOUNTER — Ambulatory Visit (HOSPITAL_BASED_OUTPATIENT_CLINIC_OR_DEPARTMENT_OTHER)
Admission: RE | Admit: 2014-05-08 | Discharge: 2014-05-08 | Disposition: A | Discharge status: Home / Self Care | Payer: Medicare Other | Source: Ambulatory Visit | Attending: Specialist | Admitting: Specialist

## 2014-05-08 DIAGNOSIS — I10 Essential (primary) hypertension: Secondary | ICD-10-CM | POA: Diagnosis not present

## 2014-05-08 DIAGNOSIS — F329 Major depressive disorder, single episode, unspecified: Secondary | ICD-10-CM | POA: Insufficient documentation

## 2014-05-08 DIAGNOSIS — E669 Obesity, unspecified: Secondary | ICD-10-CM | POA: Diagnosis not present

## 2014-05-08 DIAGNOSIS — K219 Gastro-esophageal reflux disease without esophagitis: Secondary | ICD-10-CM | POA: Diagnosis not present

## 2014-05-08 DIAGNOSIS — S83282A Other tear of lateral meniscus, current injury, left knee, initial encounter: Secondary | ICD-10-CM | POA: Diagnosis not present

## 2014-05-08 DIAGNOSIS — E785 Hyperlipidemia, unspecified: Secondary | ICD-10-CM | POA: Insufficient documentation

## 2014-05-08 DIAGNOSIS — Z955 Presence of coronary angioplasty implant and graft: Secondary | ICD-10-CM | POA: Insufficient documentation

## 2014-05-08 DIAGNOSIS — M25562 Pain in left knee: Secondary | ICD-10-CM | POA: Diagnosis present

## 2014-05-08 DIAGNOSIS — Z7982 Long term (current) use of aspirin: Secondary | ICD-10-CM | POA: Diagnosis not present

## 2014-05-08 DIAGNOSIS — X58XXXA Exposure to other specified factors, initial encounter: Secondary | ICD-10-CM | POA: Diagnosis not present

## 2014-05-08 DIAGNOSIS — Z79891 Long term (current) use of opiate analgesic: Secondary | ICD-10-CM | POA: Diagnosis not present

## 2014-05-08 DIAGNOSIS — M2242 Chondromalacia patellae, left knee: Secondary | ICD-10-CM | POA: Diagnosis not present

## 2014-05-08 DIAGNOSIS — Z96641 Presence of right artificial hip joint: Secondary | ICD-10-CM | POA: Insufficient documentation

## 2014-05-08 DIAGNOSIS — M179 Osteoarthritis of knee, unspecified: Secondary | ICD-10-CM | POA: Insufficient documentation

## 2014-05-08 DIAGNOSIS — Z79899 Other long term (current) drug therapy: Secondary | ICD-10-CM | POA: Diagnosis not present

## 2014-05-08 DIAGNOSIS — Z6831 Body mass index (BMI) 31.0-31.9, adult: Secondary | ICD-10-CM | POA: Diagnosis not present

## 2014-05-08 DIAGNOSIS — Z7902 Long term (current) use of antithrombotics/antiplatelets: Secondary | ICD-10-CM | POA: Insufficient documentation

## 2014-05-08 DIAGNOSIS — M23352 Other meniscus derangements, posterior horn of lateral meniscus, left knee: Secondary | ICD-10-CM | POA: Diagnosis not present

## 2014-05-08 DIAGNOSIS — I4891 Unspecified atrial fibrillation: Secondary | ICD-10-CM | POA: Insufficient documentation

## 2014-05-08 DIAGNOSIS — Z87891 Personal history of nicotine dependence: Secondary | ICD-10-CM | POA: Insufficient documentation

## 2014-05-08 DIAGNOSIS — Z9889 Other specified postprocedural states: Secondary | ICD-10-CM

## 2014-05-08 DIAGNOSIS — M1712 Unilateral primary osteoarthritis, left knee: Secondary | ICD-10-CM | POA: Diagnosis not present

## 2014-05-08 DIAGNOSIS — I2510 Atherosclerotic heart disease of native coronary artery without angina pectoris: Secondary | ICD-10-CM | POA: Insufficient documentation

## 2014-05-08 HISTORY — DX: Long term (current) use of anticoagulants: Z79.01

## 2014-05-08 HISTORY — DX: Unspecified atrial fibrillation: I48.91

## 2014-05-08 HISTORY — DX: Presence of coronary angioplasty implant and graft: Z95.5

## 2014-05-08 HISTORY — DX: Personal history of colonic polyps: Z86.010

## 2014-05-08 HISTORY — DX: Unspecified tear of unspecified meniscus, current injury, left knee, initial encounter: S83.207A

## 2014-05-08 HISTORY — DX: Osteoarthritis of knee, unspecified: M17.9

## 2014-05-08 HISTORY — DX: Personal history of adenomatous and serrated colon polyps: Z86.0101

## 2014-05-08 HISTORY — DX: Other specified personal risk factors, not elsewhere classified: Z91.89

## 2014-05-08 HISTORY — PX: KNEE ARTHROSCOPY: SHX127

## 2014-05-08 HISTORY — DX: Unilateral primary osteoarthritis, unspecified knee: M17.10

## 2014-05-08 LAB — POCT I-STAT 4, (NA,K, GLUC, HGB,HCT)
Glucose, Bld: 94 mg/dL (ref 70–99)
HEMATOCRIT: 49 % (ref 39.0–52.0)
HEMOGLOBIN: 16.7 g/dL (ref 13.0–17.0)
POTASSIUM: 4.7 mmol/L (ref 3.5–5.1)
Sodium: 142 mmol/L (ref 135–145)

## 2014-05-08 SURGERY — ARTHROSCOPY, KNEE
Anesthesia: General | Site: Knee | Laterality: Left

## 2014-05-08 MED ORDER — ONDANSETRON HCL 4 MG/2ML IJ SOLN
INTRAMUSCULAR | Status: DC | PRN
  Administered 2014-05-08: 4 mg via INTRAVENOUS

## 2014-05-08 MED ORDER — POVIDONE-IODINE 7.5 % EX SOLN
Freq: Once | CUTANEOUS | Status: DC
  Filled 2014-05-08: qty 118

## 2014-05-08 MED ORDER — MIDAZOLAM HCL 2 MG/2ML IJ SOLN
INTRAMUSCULAR | Status: AC
  Filled 2014-05-08: qty 2

## 2014-05-08 MED ORDER — PROPOFOL 10 MG/ML IV BOLUS
INTRAVENOUS | Status: DC | PRN
  Administered 2014-05-08: 200 mg via INTRAVENOUS

## 2014-05-08 MED ORDER — CEFAZOLIN SODIUM-DEXTROSE 2-3 GM-% IV SOLR
INTRAVENOUS | Status: AC
  Filled 2014-05-08: qty 50

## 2014-05-08 MED ORDER — EPHEDRINE SULFATE 50 MG/ML IJ SOLN
INTRAMUSCULAR | Status: DC | PRN
  Administered 2014-05-08: 20 mg via INTRAVENOUS

## 2014-05-08 MED ORDER — ACETAMINOPHEN 10 MG/ML IV SOLN
INTRAVENOUS | Status: DC | PRN
  Administered 2014-05-08: 1000 mg via INTRAVENOUS

## 2014-05-08 MED ORDER — FENTANYL CITRATE 0.05 MG/ML IJ SOLN
INTRAMUSCULAR | Status: AC
  Filled 2014-05-08: qty 4

## 2014-05-08 MED ORDER — CEPHALEXIN 500 MG PO CAPS: 500.0000 mg | ORAL_CAPSULE | Freq: Three times a day (TID) | ORAL | Status: DC

## 2014-05-08 MED ORDER — MORPHINE SULFATE 4 MG/ML IJ SOLN
INTRAMUSCULAR | Status: AC
  Filled 2014-05-08: qty 1

## 2014-05-08 MED ORDER — OXYCODONE-ACETAMINOPHEN 5-325 MG PO TABS: 1.0000 | ORAL_TABLET | ORAL | Status: DC | PRN

## 2014-05-08 MED ORDER — BUPIVACAINE HCL 0.25 % IJ SOLN: INTRAMUSCULAR | Status: DC | PRN

## 2014-05-08 MED ORDER — LACTATED RINGERS IV SOLN
INTRAVENOUS | Status: DC
  Administered 2014-05-08: 11:00:00 via INTRAVENOUS
  Filled 2014-05-08: qty 1000

## 2014-05-08 MED ORDER — MORPHINE SULFATE 4 MG/ML IJ SOLN
INTRAMUSCULAR | Status: DC | PRN
  Administered 2014-05-08: 4 mg via INTRAMUSCULAR

## 2014-05-08 MED ORDER — FENTANYL CITRATE 0.05 MG/ML IJ SOLN
25.0000 ug | INTRAMUSCULAR | Status: DC | PRN
  Filled 2014-05-08: qty 1

## 2014-05-08 MED ORDER — BUPIVACAINE-EPINEPHRINE (PF) 0.25% -1:200000 IJ SOLN
INTRAMUSCULAR | Status: DC | PRN
  Administered 2014-05-08: 30 mL via PERINEURAL

## 2014-05-08 MED ORDER — DEXAMETHASONE SODIUM PHOSPHATE 10 MG/ML IJ SOLN
INTRAMUSCULAR | Status: DC | PRN
  Administered 2014-05-08: 10 mg via INTRAVENOUS

## 2014-05-08 MED ORDER — LIDOCAINE HCL (CARDIAC) 20 MG/ML IV SOLN
INTRAVENOUS | Status: DC | PRN
  Administered 2014-05-08: 80 mg via INTRAVENOUS

## 2014-05-08 MED ORDER — SODIUM CHLORIDE 0.9 % IR SOLN
Status: DC | PRN
  Administered 2014-05-08: 6000 mL

## 2014-05-08 MED ORDER — FENTANYL CITRATE 0.05 MG/ML IJ SOLN
INTRAMUSCULAR | Status: DC | PRN
  Administered 2014-05-08 (×2): 12.5 ug via INTRAVENOUS
  Administered 2014-05-08 (×3): 25 ug via INTRAVENOUS

## 2014-05-08 MED ORDER — PROMETHAZINE HCL 25 MG/ML IJ SOLN
6.2500 mg | INTRAMUSCULAR | Status: DC | PRN
  Filled 2014-05-08: qty 1

## 2014-05-08 MED ORDER — CEFAZOLIN SODIUM-DEXTROSE 2-3 GM-% IV SOLR
2.0000 g | INTRAVENOUS | Status: AC
Start: 2014-05-08 — End: 2014-05-08
  Administered 2014-05-08: 2 g via INTRAVENOUS
  Filled 2014-05-08: qty 50

## 2014-05-08 MED ORDER — LACTATED RINGERS IV SOLN
INTRAVENOUS | Status: DC | PRN
  Administered 2014-05-08 (×2): via INTRAVENOUS

## 2014-05-08 SURGICAL SUPPLY — 52 items
BANDAGE ESMARK 6X9 LF (GAUZE/BANDAGES/DRESSINGS) ×1 IMPLANT
BLADE 4.2CUDA (BLADE) IMPLANT
BLADE CUDA 4.2 (BLADE) IMPLANT
BLADE CUDA GRT WHITE 3.5 (BLADE) ×2 IMPLANT
BLADE CUDA SHAVER 3.5 (BLADE) IMPLANT
BNDG ESMARK 6X9 LF (GAUZE/BANDAGES/DRESSINGS) ×2
BNDG GAUZE ELAST 4 BULKY (GAUZE/BANDAGES/DRESSINGS) ×2 IMPLANT
CANISTER SUCT LVC 12 LTR MEDI- (MISCELLANEOUS) ×4 IMPLANT
CANISTER SUCTION 1200CC (MISCELLANEOUS) ×2 IMPLANT
CLOTH BEACON ORANGE TIMEOUT ST (SAFETY) ×2 IMPLANT
CUFF TOURNIQUET SINGLE 34IN LL (TOURNIQUET CUFF) ×2 IMPLANT
DRAPE ARTHROSCOPY W/POUCH 114 (DRAPES) ×2 IMPLANT
DRAPE INCISE 23X17 IOBAN STRL (DRAPES)
DRAPE INCISE IOBAN 23X17 STRL (DRAPES) IMPLANT
DRAPE INCISE IOBAN 66X45 STRL (DRAPES) ×2 IMPLANT
DRSG PAD ABDOMINAL 8X10 ST (GAUZE/BANDAGES/DRESSINGS) ×2 IMPLANT
DURAPREP 26ML APPLICATOR (WOUND CARE) ×2 IMPLANT
ELECT MENISCUS 165MM 90D (ELECTRODE) IMPLANT
ELECT REM PT RETURN 9FT ADLT (ELECTROSURGICAL)
ELECTRODE REM PT RTRN 9FT ADLT (ELECTROSURGICAL) IMPLANT
GAUZE XEROFORM 1X8 LF (GAUZE/BANDAGES/DRESSINGS) ×2 IMPLANT
GLOVE BIO SURGEON STRL SZ7.5 (GLOVE) ×2 IMPLANT
GLOVE BIO SURGEON STRL SZ8 (GLOVE) ×4 IMPLANT
GLOVE INDICATOR 8.0 STRL GRN (GLOVE) ×4 IMPLANT
GOWN STRL REUS W/ TWL LRG LVL3 (GOWN DISPOSABLE) ×1 IMPLANT
GOWN STRL REUS W/ TWL XL LVL3 (GOWN DISPOSABLE) ×3 IMPLANT
GOWN STRL REUS W/TWL LRG LVL3 (GOWN DISPOSABLE) ×1
GOWN STRL REUS W/TWL XL LVL3 (GOWN DISPOSABLE) ×3
IMMOBILIZER KNEE 22 UNIV (SOFTGOODS) IMPLANT
IMMOBILIZER KNEE 24 THIGH 36 (MISCELLANEOUS) IMPLANT
IMMOBILIZER KNEE 24 UNIV (MISCELLANEOUS)
IV NS IRRIG 3000ML ARTHROMATIC (IV SOLUTION) ×4 IMPLANT
KNEE WRAP E Z 3 GEL PACK (MISCELLANEOUS) ×2 IMPLANT
MINI VAC (SURGICAL WAND) ×2 IMPLANT
NEEDLE HYPO 22GX1.5 SAFETY (NEEDLE) ×2 IMPLANT
PACK ARTHROSCOPY DSU (CUSTOM PROCEDURE TRAY) ×2 IMPLANT
PACK BASIN DAY SURGERY FS (CUSTOM PROCEDURE TRAY) ×2 IMPLANT
PAD ABD 8X10 STRL (GAUZE/BANDAGES/DRESSINGS) ×4 IMPLANT
PADDING CAST ABS 4INX4YD NS (CAST SUPPLIES)
PADDING CAST ABS COTTON 4X4 ST (CAST SUPPLIES) IMPLANT
PENCIL BUTTON HOLSTER BLD 10FT (ELECTRODE) IMPLANT
SET ARTHROSCOPY TUBING (MISCELLANEOUS) ×1
SET ARTHROSCOPY TUBING LN (MISCELLANEOUS) ×1 IMPLANT
SPONGE GAUZE 4X4 12PLY (GAUZE/BANDAGES/DRESSINGS) ×2 IMPLANT
SPONGE GAUZE 4X4 12PLY STER LF (GAUZE/BANDAGES/DRESSINGS) ×2 IMPLANT
SUT ETHILON 4 0 PS 2 18 (SUTURE) ×2 IMPLANT
SYR CONTROL 10ML LL (SYRINGE) IMPLANT
TOWEL OR 17X24 6PK STRL BLUE (TOWEL DISPOSABLE) ×4 IMPLANT
TUBE CONNECTING 12X1/4 (SUCTIONS) ×2 IMPLANT
WAND 30 DEG SABER W/CORD (SURGICAL WAND) IMPLANT
WAND 90 DEG TURBOVAC W/CORD (SURGICAL WAND) IMPLANT
WATER STERILE IRR 500ML POUR (IV SOLUTION) ×2 IMPLANT

## 2014-05-08 NOTE — H&P (View-Only) (Signed)
   05/04/14 1224  OBSTRUCTIVE SLEEP APNEA  Have you ever been diagnosed with sleep apnea through a sleep study? No  Do you snore loudly (loud enough to be heard through closed doors)?  0  Do you often feel tired, fatigued, or sleepy during the daytime? 0  Has anyone observed you stop breathing during your sleep? 0  Do you have, or are you being treated for high blood pressure? 1  BMI more than 35 kg/m2? 0  Age over 72 years old? 1  Neck circumference greater than 40 cm/16 inches? 1  Gender: 1  Obstructive Sleep Apnea Score 4

## 2014-05-08 NOTE — Op Note (Signed)
Dictated [id number]

## 2014-05-08 NOTE — Interval H&P Note (Signed)
History and Physical Interval Note:  05/08/2014 12:34 PM  Eddie Price  has presented today for surgery, with the diagnosis of LEFT KNEE LATERAL MENISCUS TEAR AND OA  The various methods of treatment have been discussed with the patient and family. After consideration of risks, benefits and other options for treatment, the patient has consented to  Procedure(s): LEFT ARTHROSCOPY KNEE WIGHT DEBRIDEMENT PARTIAL LATERAL MENISCECTOMY AND CHONDROPLASTY (Left) as a surgical intervention .  The patient's history has been reviewed, patient examined, no change in status, stable for surgery.  I have reviewed the patient's chart and labs.  Questions were answered to the patient's satisfaction.     [name] ANDREW

## 2014-05-08 NOTE — H&P (Signed)
Eddie Price is an 74 y.o. male.   Chief Complaint: Left knee pain for a few weeks HPI: Patient presents with joint discomfort that had been persistent for several weeks now. Despite conservative treatments, his discomfort has not improved. Imaging was obtained. Other conservative and surgical treatments were discussed in detail. Patient wishes to proceed with surgery as consented. Denies SOB, CP, or calf pain. No Fever, chills, or nausea/ vomiting.   Past Medical History  Diagnosis Date  . Hypertension   . Hyperlipidemia   . Depression   . History of adenomatous polyp of colon     2009  . Atrial fibrillation     new on-set Jan 2016--  with good rate control on atenolol  per cardiologist note Dr Bronson Ing 04-09-2014  . S/P drug eluting coronary stent placement     RCA x1  06-17-2012  . CAD, multiple vessel cardiologist--  dr Bronson Ing    3 vessel CAD //  s/p DES x1 to ostial RCA 06-17-2012  . Anticoagulant long-term use   . Acute meniscal tear of left knee   . OA (osteoarthritis) of knee   . At risk for sleep apnea     STOP-BANG= 4       SENT TO PCP 05-04-2014    Past Surgical History  Procedure Laterality Date  . Percutaneous coronary stent intervention (pci-s) N/A 06/17/2012    Procedure: PERCUTANEOUS CORONARY STENT INTERVENTION (PCI-S);  Surgeon: Burnell Blanks, MD;  Location: [facility] CATH LAB;  Service: Cardiovascular;  Laterality: N/A;  DES x1 ostial RCA  . Knee arthroscopy Right 2005  (approx)  . Left elbow surgery  1969  . Total hip arthroplasty Right 05-25-2006  . Colonoscopy  last one 07-01-2010  . Transthoracic echocardiogram  02-08-2014    mild focal basal hypertrophy of the septum, ef 55-60%,  indeterminate diastolic function in setting of atrial fib./  possibly bicuspid AV, mild calcification without stenosis, trivial AR/  mild MR and PR/  moderate to severe LAE/  trivial PR/  moderate RAE  . Cardiac catheterization  06-15-2012   dr Angelena Form    Triple vessel CAD  with severe ostial RCA stenosis, severe stenosis in the small caliber diagonal branch and small caliber OM1, OM2 branches/  preserved LVSF  . Tonsillectomy  1970  . Shoulder arthroscopy with rotator cuff repair Bilateral x2 left//   x1 right (last one , left in 2001)  . Laparoscopic cholecystectomy  2011    AND REPAIR UMBILICAL HERNIA  . Vasectomy  1970'sj  w/  general anesthesia    Family History  Problem Relation Age of Onset  . Heart failure Mother   . COPD Mother   . Depression Mother   . Mental illness Mother   . Heart failure Father   . Stroke Father   . Heart attack Father   . Heart disease Father   . Hyperlipidemia Father   . Cancer Maternal Grandmother     cervical   Social History:  reports that he quit smoking about 43 years ago. He started smoking about 55 years ago. He quit smokeless tobacco use about 44 years ago. He reports that he does not drink alcohol or use illicit drugs.  Allergies:  Allergies  Allergen Reactions  . Sulfa Antibiotics Rash    Medications Prior to Admission  Medication Sig Dispense Refill  . apixaban (ELIQUIS) 5 MG TABS tablet Take 1 tablet (5 mg total) by mouth 2 (two) times daily. 180 tablet 3  . aspirin  81 MG tablet Take 81 mg by mouth daily.    Marland Kitchen atenolol (TENORMIN) 50 MG tablet Take 1 tablet (50 mg total) by mouth daily. (Patient taking differently: Take 50 mg by mouth every morning. ) 90 tablet 3  . atorvastatin (LIPITOR) 40 MG tablet Take 0.5 tablets (20 mg total) by mouth daily at 6 PM. 90 tablet 3  . Multiple Vitamin (MULTIVITAMIN) capsule Take 1 capsule by mouth daily.    . Omega 3 1000 MG CAPS Take 2,000 mg by mouth 2 (two) times daily.    . sertraline (ZOLOFT) 100 MG tablet Take 1 tablet (100 mg total) by mouth daily. (Patient taking differently: Take 100 mg by mouth every morning. ) 90 tablet 0  . vitamin E (VITAMIN E) 400 UNIT capsule Take 800 Units by mouth daily.    . nitroGLYCERIN (NITROSTAT) 0.4 MG SL tablet Place 1 tablet  (0.4 mg total) under the tongue every 5 (five) minutes x 3 doses as needed for chest pain. 30 tablet 3  . oxyCODONE-acetaminophen (PERCOCET/ROXICET) 5-325 MG per tablet Take by mouth every 4 (four) hours as needed for severe pain.      No results found for this or any previous visit (from the past 48 hour(s)). No results found.  Review of Systems  Constitutional: Negative.   HENT: Negative.   Eyes: Negative.   Respiratory: Negative.   Cardiovascular: Negative.   Gastrointestinal: Negative.   Genitourinary: Negative.   Musculoskeletal: Positive for joint pain.  Skin: Negative.   Neurological: Negative.   Endo/Heme/Allergies: Negative.   Psychiatric/Behavioral: Negative.     Blood pressure 127/77, pulse 66, temperature 98.5 F (36.9 C), temperature source Oral, resp. rate 16, height 5\' 11"  (1.803 m), weight 101.606 kg (224 lb), SpO2 99 %. Physical Exam  Constitutional: He is oriented to person, place, and time. He appears well-developed.  Eyes: EOM are normal.  Neck: Normal range of motion.  Cardiovascular: Normal rate, normal heart sounds and intact distal pulses.   Respiratory: Effort normal and breath sounds normal.  GI: Soft. Bowel sounds are normal.  Genitourinary:  Deferred  Musculoskeletal:  Left knee Pain with ROM. LLE grossly N/V intact.  Neurological: He is alert and oriented to person, place, and time.  Skin: Skin is warm and dry.  Psychiatric: His behavior is normal.     Assessment/Plan Left torn meniscus and OA Left knee scope as consented D/c home today Follow intsrtuction F/u in office next week  Demeshia Sherburne L 05/08/2014, 11:44 AM

## 2014-05-08 NOTE — Anesthesia Procedure Notes (Signed)
Procedure Name: LMA Insertion Date/Time: 05/08/2014 12:40 PM Performed by: Justice Rocher Pre-anesthesia Checklist: Patient identified, Emergency Drugs available, Suction available and Patient being monitored Patient Re-evaluated:Patient Re-evaluated prior to inductionOxygen Delivery Method: Circle System Utilized Preoxygenation: Pre-oxygenation with 100% oxygen Intubation Type: IV induction Ventilation: Mask ventilation without difficulty LMA: LMA inserted LMA Size: 5.0 Number of attempts: 1 Airway Equipment and Method: Bite block Placement Confirmation: positive ETCO2 Tube secured with: Tape Dental Injury: Teeth and Oropharynx as per pre-operative assessment

## 2014-05-08 NOTE — Transfer of Care (Signed)
Immediate Anesthesia Transfer of Care Note  Patient: Eddie Price  Procedure(s) Performed: Procedure(s): LEFT ARTHROSCOPY KNEE WIGHT DEBRIDEMENT PARTIAL LATERAL MENISCECTOMY AND CHONDROPLASTY (Left)  Patient Location: PACU  Anesthesia Type:General  Level of Consciousness: awake, alert , oriented and patient cooperative  Airway & Oxygen Therapy: Patient Spontanous Breathing and Patient connected to nasal cannula oxygen  Post-op Assessment: Report given to RN and Post -op Vital signs reviewed and stable  Post vital signs: Reviewed and stable  Last Vitals:  Filed Vitals:   05/08/14 1022  BP: 127/77  Pulse: 66  Temp: 36.9 C  Resp: 16    Complications: No apparent anesthesia complications

## 2014-05-08 NOTE — Discharge Instructions (Signed)

## 2014-05-08 NOTE — Anesthesia Preprocedure Evaluation (Signed)
Anesthesia Evaluation  Patient identified by MRN, date of birth, ID band Patient awake    Reviewed: Allergy & Precautions, NPO status , Patient's Chart, lab work & pertinent test results  Airway Mallampati: II  TM Distance: >3 FB Neck ROM: Full    Dental no notable dental hx.    Pulmonary neg pulmonary ROS, former smoker,  breath sounds clear to auscultation  Pulmonary exam normal       Cardiovascular Exercise Tolerance: Good hypertension, + angina + CAD Rhythm:Regular Rate:Normal     Neuro/Psych PSYCHIATRIC DISORDERS Depression negative neurological ROS     GI/Hepatic Neg liver ROS, GERD-  ,  Endo/Other  negative endocrine ROS  Renal/GU negative Renal ROS  negative genitourinary   Musculoskeletal  (+) Arthritis -,   Abdominal (+) + obese,   Peds negative pediatric ROS (+)  Hematology negative hematology ROS (+)   Anesthesia Other Findings   Reproductive/Obstetrics negative OB ROS                             Anesthesia Physical Anesthesia Plan  ASA: III  Anesthesia Plan: General   Post-op Pain Management:    Induction: Intravenous  Airway Management Planned: LMA  Additional Equipment:   Intra-op Plan:   Post-operative Plan: Extubation in OR  Informed Consent: I have reviewed the patients History and Physical, chart, labs and discussed the procedure including the risks, benefits and alternatives for the proposed anesthesia with the patient or authorized representative who has indicated his/her understanding and acceptance.   Dental advisory given  Plan Discussed with: CRNA  Anesthesia Plan Comments:         Anesthesia Quick Evaluation

## 2014-05-08 NOTE — Op Note (Signed)
Dictated

## 2014-05-09 ENCOUNTER — Encounter (HOSPITAL_BASED_OUTPATIENT_CLINIC_OR_DEPARTMENT_OTHER): Payer: Self-pay | Admitting: Specialist

## 2014-05-09 NOTE — Anesthesia Postprocedure Evaluation (Signed)
  Anesthesia Post-op Note  Patient: Eddie Price  Procedure(s) Performed: Procedure(s) (LRB): LEFT ARTHROSCOPY KNEE WIGHT DEBRIDEMENT PARTIAL LATERAL MENISCECTOMY AND CHONDROPLASTY (Left)  Patient Location: PACU  Anesthesia Type: General  Level of Consciousness: awake and alert   Airway and Oxygen Therapy: Patient Spontanous Breathing  Post-op Pain: mild  Post-op Assessment: Post-op Vital signs reviewed, Patient's Cardiovascular Status Stable, Respiratory Function Stable, Patent Airway and No signs of Nausea or vomiting  Last Vitals:  Filed Vitals:   05/08/14 1500  BP: 134/77  Pulse: 88  Temp: 36.7 C  Resp: 18    Post-op Vital Signs: stable   Complications: No apparent anesthesia complications

## 2014-05-09 NOTE — Op Note (Signed)
NAMEGWYN, Eddie Price                ACCOUNT NO.:  [id number]  MEDICAL RECORD NO.:  [id number]  LOCATION:                                 FACILITY:  PHYSICIAN:  Cynda Familia, M.D.DATE OF BIRTH:  Aug 31, 1940  DATE OF PROCEDURE: DATE OF DISCHARGE:  05/08/2014                              OPERATIVE REPORT   PREOPERATIVE DIAGNOSES:  Left knee torn lateral meniscus, osteoarthritis.  POSTOPERATIVE DIAGNOSES: 1. Left knee complex tear of lateral meniscus. 2. Osteoarthritis grade 3 patella, grade 3-4 lateral tibial plateau.  PROCEDURES: 1. Left knee arthroscopic partial lateral meniscectomy. 2. Chondroplasty of lateral tibial plateau and patella.  SURGEON:  Cynda Familia, M.D.  ASSISTANT:  Wyatt Portela, PA-C.  ANESTHESIA:  General with intraoperative knee block.  ESTIMATED BLOOD LOSS:  Minimal.  DRAINS:  None.  COMPLICATIONS:  None.  TOURNIQUET TIME:  30 minutes at 250 mmHg.  DISPOSITION:  PACU, stable.  OPERATIVE DETAILS:  The patient was counseled in the holding area. Correct site was marked and signed appropriately.  He was taken to the operating room, placed in supine position under general anesthesia.  TED hose was applied to the uninvolved leg.  IV antibiotics were given. Left thigh was placed into a thigh holder and prepped with DuraPrep and draped in a sterile fashion.  Time-out was done and confirmed the left side.  Exsanguinated with an Esmarch.  Tourniquet was inflated to 250 mmHg.  Arthroscopic portals were established; proximal medial, inferolateral, and inferomedial.  Diagnostic arthroscopy was undertaken. Patellofemoral joint with normal tracking and grade 3 chondromalacia of the patella and mechanical chondroplasty was performed with a shaver. Suture passed and the lateral gutters were unremarkable.  Anterior and posterior cruciate ligaments were intact.  Medial compartment was inspected.  Minimal chondromalacia, medial meniscus was found  and showed fraying, but no frank tear.  Lateral side was inspected.  Large complex tear involving the anterior two-thirds of lateral meniscus with markedly unstable edge and degenerative in nature.  Utilized the baskets and motorized shaver and MiniVac system, partial medial meniscectomy was performed back to a stable edge.  There was also grade 3 and only grade 4 chondromalacia of the lateral tibial plateau, chondroplasty was performed with mechanical shaver back to stable base.  Lateral femoral condyle, mild changes.  No other abnormalities were noted in the knee. The knee was irrigated and arthroscopic equipment was removed.  The portals were closed with 4-0 nylon suture.  A 10 mL of 0.25% Sensorcaine with epinephrine was placed to the skin and 20 mL of 0.25% Marcaine with epinephrine was then placed to the knee joint with 4 mg of morphine sulfate.  Sterile dressing was applied along with TED hose and ice pack. There were no complications or problems.  He was then awakened and taken from the operating room to PACU in stable condition.  After dressing was applied, tourniquet was deflated.  He had normal circulation in the foot and ankle at the end of the case.  Due to the fact that the patient was on Eliquis preoperatively and stopped two days ago and was started again tomorrow that was the indication for the epinephrine inside the joint. He  will be stabilized in PACU and discharged to home.  Call my office if any problems.          ______________________________ Cynda Familia, M.D.     RAC/MEDQ  D:  05/08/2014  T:  05/09/2014  Job:  [id number]

## 2014-05-15 DIAGNOSIS — M25562 Pain in left knee: Secondary | ICD-10-CM | POA: Diagnosis not present

## 2014-05-15 DIAGNOSIS — R2689 Other abnormalities of gait and mobility: Secondary | ICD-10-CM | POA: Diagnosis not present

## 2014-05-15 DIAGNOSIS — Z4789 Encounter for other orthopedic aftercare: Secondary | ICD-10-CM | POA: Diagnosis not present

## 2014-05-15 DIAGNOSIS — M1712 Unilateral primary osteoarthritis, left knee: Secondary | ICD-10-CM | POA: Diagnosis not present

## 2014-05-17 DIAGNOSIS — M25562 Pain in left knee: Secondary | ICD-10-CM | POA: Diagnosis not present

## 2014-05-17 DIAGNOSIS — R2689 Other abnormalities of gait and mobility: Secondary | ICD-10-CM | POA: Diagnosis not present

## 2014-05-17 DIAGNOSIS — M1712 Unilateral primary osteoarthritis, left knee: Secondary | ICD-10-CM | POA: Diagnosis not present

## 2014-05-17 DIAGNOSIS — Z4789 Encounter for other orthopedic aftercare: Secondary | ICD-10-CM | POA: Diagnosis not present

## 2014-05-21 DIAGNOSIS — R2689 Other abnormalities of gait and mobility: Secondary | ICD-10-CM | POA: Diagnosis not present

## 2014-05-21 DIAGNOSIS — M25562 Pain in left knee: Secondary | ICD-10-CM | POA: Diagnosis not present

## 2014-05-21 DIAGNOSIS — Z4789 Encounter for other orthopedic aftercare: Secondary | ICD-10-CM | POA: Diagnosis not present

## 2014-05-21 DIAGNOSIS — M1712 Unilateral primary osteoarthritis, left knee: Secondary | ICD-10-CM | POA: Diagnosis not present

## 2014-05-24 DIAGNOSIS — R2689 Other abnormalities of gait and mobility: Secondary | ICD-10-CM | POA: Diagnosis not present

## 2014-05-24 DIAGNOSIS — M25562 Pain in left knee: Secondary | ICD-10-CM | POA: Diagnosis not present

## 2014-05-24 DIAGNOSIS — M1712 Unilateral primary osteoarthritis, left knee: Secondary | ICD-10-CM | POA: Diagnosis not present

## 2014-05-24 DIAGNOSIS — Z4789 Encounter for other orthopedic aftercare: Secondary | ICD-10-CM | POA: Diagnosis not present

## 2014-05-28 DIAGNOSIS — M25562 Pain in left knee: Secondary | ICD-10-CM | POA: Diagnosis not present

## 2014-05-28 DIAGNOSIS — M1712 Unilateral primary osteoarthritis, left knee: Secondary | ICD-10-CM | POA: Diagnosis not present

## 2014-05-28 DIAGNOSIS — Z4789 Encounter for other orthopedic aftercare: Secondary | ICD-10-CM | POA: Diagnosis not present

## 2014-05-28 DIAGNOSIS — R2689 Other abnormalities of gait and mobility: Secondary | ICD-10-CM | POA: Diagnosis not present

## 2014-05-31 DIAGNOSIS — M1712 Unilateral primary osteoarthritis, left knee: Secondary | ICD-10-CM | POA: Diagnosis not present

## 2014-05-31 DIAGNOSIS — Z4789 Encounter for other orthopedic aftercare: Secondary | ICD-10-CM | POA: Diagnosis not present

## 2014-05-31 DIAGNOSIS — M25562 Pain in left knee: Secondary | ICD-10-CM | POA: Diagnosis not present

## 2014-05-31 DIAGNOSIS — R2689 Other abnormalities of gait and mobility: Secondary | ICD-10-CM | POA: Diagnosis not present

## 2014-06-04 ENCOUNTER — Encounter: Payer: Self-pay | Admitting: Family Medicine

## 2014-06-04 ENCOUNTER — Ambulatory Visit (INDEPENDENT_AMBULATORY_CARE_PROVIDER_SITE_OTHER): Payer: Medicare Other | Admitting: Family Medicine

## 2014-06-04 VITALS — BP 136/72 | HR 72 | Temp 98.6°F | Resp 16 | Wt 229.0 lb

## 2014-06-04 DIAGNOSIS — E785 Hyperlipidemia, unspecified: Secondary | ICD-10-CM | POA: Diagnosis not present

## 2014-06-04 DIAGNOSIS — F418 Other specified anxiety disorders: Secondary | ICD-10-CM | POA: Diagnosis not present

## 2014-06-04 DIAGNOSIS — E669 Obesity, unspecified: Secondary | ICD-10-CM | POA: Diagnosis not present

## 2014-06-04 DIAGNOSIS — I10 Essential (primary) hypertension: Secondary | ICD-10-CM

## 2014-06-04 DIAGNOSIS — I4891 Unspecified atrial fibrillation: Secondary | ICD-10-CM

## 2014-06-04 LAB — BASIC METABOLIC PANEL
BUN: 14 mg/dL (ref 6–23)
CHLORIDE: 105 meq/L (ref 96–112)
CO2: 27 meq/L (ref 19–32)
CREATININE: 1.14 mg/dL (ref 0.50–1.35)
Calcium: 9.6 mg/dL (ref 8.4–10.5)
Glucose, Bld: 93 mg/dL (ref 70–99)
Potassium: 5 mEq/L (ref 3.5–5.3)
Sodium: 140 mEq/L (ref 135–145)

## 2014-06-04 LAB — LIPID PANEL
Cholesterol: 93 mg/dL (ref 0–200)
HDL: 28 mg/dL — ABNORMAL LOW (ref >=40)
LDL Cholesterol: 47 mg/dL (ref 0–99)
TRIGLYCERIDES: 88 mg/dL (ref <150)
Total CHOL/HDL Ratio: 3.3 Ratio
VLDL: 18 mg/dL (ref 0–40)

## 2014-06-04 LAB — CBC WITH DIFFERENTIAL/PLATELET
BASOS ABS: 0 10*3/uL (ref 0.0–0.1)
BASOS PCT: 0 % (ref 0–1)
EOS ABS: 0.5 10*3/uL (ref 0.0–0.7)
Eosinophils Relative: 7 % — ABNORMAL HIGH (ref 0–5)
HCT: 45.9 % (ref 39.0–52.0)
Hemoglobin: 15.7 g/dL (ref 13.0–17.0)
Lymphocytes Relative: 39 % (ref 12–46)
Lymphs Abs: 2.6 10*3/uL (ref 0.7–4.0)
MCH: 32.9 pg (ref 26.0–34.0)
MCHC: 34.2 g/dL (ref 30.0–36.0)
MCV: 96.2 fL (ref 78.0–100.0)
MPV: 9.3 fL (ref 8.6–12.4)
Monocytes Absolute: 0.7 10*3/uL (ref 0.1–1.0)
Monocytes Relative: 11 % (ref 3–12)
NEUTROS ABS: 2.9 10*3/uL (ref 1.7–7.7)
NEUTROS PCT: 43 % (ref 43–77)
PLATELETS: 208 10*3/uL (ref 150–400)
RBC: 4.77 MIL/uL (ref 4.22–5.81)
RDW: 13.7 % (ref 11.5–15.5)
WBC: 6.7 10*3/uL (ref 4.0–10.5)

## 2014-06-04 NOTE — Patient Instructions (Addendum)
Continue current medications F/U  6months  

## 2014-06-04 NOTE — Assessment & Plan Note (Signed)
Continue with Zoloft at the current dose of 50 mg he does well with this medication

## 2014-06-04 NOTE — Progress Notes (Signed)
Patient ID: Eddie Price, male   DOB: [birth year], 74 y.o.   MRN: [id number]   Subjective:    Patient ID: Eddie Price, male    DOB: [birth year], 74 y.o.   MRN: [id number]  Patient presents for Follow-up  she had a follow-up chronic medical problems. He has no particular concerns today. He was seen by cardiology for his atrial fibrillation and is now rate controlled and he is on blood thinners. He is also status post arthroscopic of his left knee is doing well with this.  Medications reviewed   he went back on his Zoloft to try to taper down the found that his anxiety worse and therefore he is back to his 50 mg once a day   Review Of Systems:  GEN- denies fatigue, fever, weight loss,weakness, recent illness HEENT- denies eye drainage, change in vision, nasal discharge, CVS- denies chest pain, palpitations RESP- denies SOB, cough, wheeze ABD- denies N/V, change in stools, abd pain GU- denies dysuria, hematuria, dribbling, incontinence MSK- denies joint pain, muscle aches, injury Neuro- denies headache, dizziness, syncope, seizure activity       Objective:    BP 136/72 mmHg  Pulse 72  Temp(Src) 98.6 F (37 C)  Resp 16  Wt 229 lb (103.874 kg) GEN- NAD, alert and oriented x3 HEENT- PERRL, EOMI, non injected sclera, pink conjunctiva, MMM, oropharynx clear CVS- irregular rhythem, normal rate, no murmur RESP-CTAB Psych- Normal affect and mood EXT- No edema Pulses- Radial, DP- 2+      Assessment & Plan:      Problem List Items Addressed This Visit    None      Note: This dictation was prepared with Dragon dictation along with smaller phrase technology. Any transcriptional errors that result from this process are unintentional.

## 2014-06-04 NOTE — Assessment & Plan Note (Signed)
Currently rate controlled. He will follow-up with cardiology will get fasting labs done today. He may need cardioversion as he does have episodes where he gets tachycardic with his A. fib

## 2014-06-04 NOTE — Assessment & Plan Note (Signed)
Blood pressure is well-controlled or change her medication

## 2014-06-28 ENCOUNTER — Other Ambulatory Visit: Payer: Self-pay | Admitting: Family Medicine

## 2014-06-29 NOTE — Telephone Encounter (Signed)
Okay, give 6

## 2014-06-29 NOTE — Telephone Encounter (Signed)
Prescription sent to pharmacy.

## 2014-06-29 NOTE — Telephone Encounter (Signed)
?   Ok to refill  Last rf 03/23/14  Last ov 06/04/14

## 2014-07-18 ENCOUNTER — Other Ambulatory Visit: Payer: Self-pay | Admitting: Family Medicine

## 2014-07-19 ENCOUNTER — Ambulatory Visit (INDEPENDENT_AMBULATORY_CARE_PROVIDER_SITE_OTHER): Payer: Medicare Other | Admitting: Cardiovascular Disease

## 2014-07-19 VITALS — BP 112/72 | HR 69 | Ht 71.0 in | Wt 230.4 lb

## 2014-07-19 DIAGNOSIS — I4819 Other persistent atrial fibrillation: Secondary | ICD-10-CM

## 2014-07-19 DIAGNOSIS — I10 Essential (primary) hypertension: Secondary | ICD-10-CM | POA: Diagnosis not present

## 2014-07-19 DIAGNOSIS — E785 Hyperlipidemia, unspecified: Secondary | ICD-10-CM | POA: Diagnosis not present

## 2014-07-19 DIAGNOSIS — Q231 Congenital insufficiency of aortic valve: Secondary | ICD-10-CM

## 2014-07-19 DIAGNOSIS — I2510 Atherosclerotic heart disease of native coronary artery without angina pectoris: Secondary | ICD-10-CM

## 2014-07-19 DIAGNOSIS — I481 Persistent atrial fibrillation: Secondary | ICD-10-CM | POA: Diagnosis not present

## 2014-07-19 DIAGNOSIS — Z955 Presence of coronary angioplasty implant and graft: Secondary | ICD-10-CM | POA: Diagnosis not present

## 2014-07-19 NOTE — Patient Instructions (Signed)

## 2014-07-19 NOTE — Progress Notes (Signed)
Patient ID: Eddie Price, male   DOB: 1940/11/12, 74 y.o.   MRN: [id number]      SUBJECTIVE: The patient presents for follow up of atrial fibrillation and coronary artery disease. He underwent percutaneous coronary intervention to the ostium of a large dominant RCA on 06/17/2012. He had residual disease in a small caliber diagonal and obtuse marginal branches. He also has essential hypertension and hyperlipidemia.  He underwent event monitoring in 01/2014 which demonstrated controlled atrial fibrillation, average heart rate 77 bpm. He had symptoms of palpitations, chest pain and pressure which correlated with heart rates in the 110-120 bpm range and once when it was in the 60 beat per minute range.  Echocardiography in 01/2014 demonstrated normal left ventricular systolic function, EF 28-78%, mild basal septal hypertrophy, increased left atrial pressure, moderate to severe left atrial enlargement, mild mitral regurgitation, and possibly a bicuspid aortic valve without stenosis with trivial regurgitation and a mildly ectatic aorta with moderate right atrial enlargement.  He has been feeling well and seldom has palpitations and very seldom has fleeting chest pains.  He and his wife are going to [place] in July to celebrate their 50th anniversary.   Review of Systems: As per "subjective", otherwise negative.  Allergies  Allergen Reactions  . Sulfa Antibiotics Rash    Current Outpatient Prescriptions  Medication Sig Dispense Refill  . apixaban (ELIQUIS) 5 MG TABS tablet Take 1 tablet (5 mg total) by mouth 2 (two) times daily. 180 tablet 3  . aspirin 81 MG tablet Take 81 mg by mouth daily.    Marland Kitchen atenolol (TENORMIN) 50 MG tablet Take 1 tablet (50 mg total) by mouth daily. (Patient taking differently: Take 50 mg by mouth every morning. ) 90 tablet 3  . atorvastatin (LIPITOR) 40 MG tablet Take 0.5 tablets (20 mg total) by mouth daily at 6 PM. 90 tablet 3  . Multiple Vitamin (MULTIVITAMIN) capsule  Take 1 capsule by mouth daily.    . nitroGLYCERIN (NITROSTAT) 0.4 MG SL tablet Place 1 tablet (0.4 mg total) under the tongue every 5 (five) minutes x 3 doses as needed for chest pain. 30 tablet 3  . Omega 3 1000 MG CAPS Take 2,000 mg by mouth 2 (two) times daily.    . sertraline (ZOLOFT) 100 MG tablet TAKE ONE TABLET BY MOUTH DAILY 90 tablet 1  . vitamin E (VITAMIN E) 400 UNIT capsule Take 800 Units by mouth daily.     No current facility-administered medications for this visit.    Past Medical History  Diagnosis Date  . Hypertension   . Hyperlipidemia   . Depression   . History of adenomatous polyp of colon     2009  . Atrial fibrillation     new on-set Jan 2016--  with good rate control on atenolol  per cardiologist note Dr Bronson Ing 04-09-2014  . S/P drug eluting coronary stent placement     RCA x1  06-17-2012  . CAD, multiple vessel cardiologist--  dr Bronson Ing    3 vessel CAD //  s/p DES x1 to ostial RCA 06-17-2012  . Anticoagulant long-term use   . Acute meniscal tear of left knee   . OA (osteoarthritis) of knee   . At risk for sleep apnea     STOP-BANG= 4       SENT TO PCP 05-04-2014    Past Surgical History  Procedure Laterality Date  . Percutaneous coronary stent intervention (pci-s) N/A 06/17/2012    Procedure: PERCUTANEOUS CORONARY STENT  INTERVENTION (PCI-S);  Surgeon: Burnell Blanks, MD;  Location: [facility] CATH LAB;  Service: Cardiovascular;  Laterality: N/A;  DES x1 ostial RCA  . Knee arthroscopy Right 2005  (approx)  . Left elbow surgery  1969  . Total hip arthroplasty Right 05-25-2006  . Colonoscopy  last one 07-01-2010  . Transthoracic echocardiogram  02-08-2014    mild focal basal hypertrophy of the septum, ef 55-60%,  indeterminate diastolic function in setting of atrial fib./  possibly bicuspid AV, mild calcification without stenosis, trivial AR/  mild MR and PR/  moderate to severe LAE/  trivial PR/  moderate RAE  . Cardiac catheterization  06-15-2012    dr Angelena Form    Triple vessel CAD with severe ostial RCA stenosis, severe stenosis in the small caliber diagonal branch and small caliber OM1, OM2 branches/  preserved LVSF  . Tonsillectomy  1970  . Shoulder arthroscopy with rotator cuff repair Bilateral x2 left//   x1 right (last one , left in 2001)  . Laparoscopic cholecystectomy  2011    AND REPAIR UMBILICAL HERNIA  . Vasectomy  1970'sj  w/  general anesthesia  . Knee arthroscopy Left 05/08/2014    Procedure: LEFT ARTHROSCOPY KNEE WIGHT DEBRIDEMENT PARTIAL LATERAL MENISCECTOMY AND CHONDROPLASTY;  Surgeon: Sydnee Cabal, MD;  Location: [facility] [place];  Service: Orthopedics;  Laterality: Left;    History   Social History  . Marital Status: Married    Spouse Name: N/A  . Number of Children: N/A  . Years of Education: N/A   Occupational History  . Not on file.   Social History Main Topics  . Smoking status: Former Smoker -- 1.00 packs/day for 10 years    Start date: 02/02/1959    Quit date: 06/16/1970  . Smokeless tobacco: Former Systems developer    Quit date: 01/26/1970  . Alcohol Use: No     Comment: RARE  . Drug Use: No  . Sexual Activity: Not on file   Other Topics Concern  . Not on file   Social History Narrative     Filed Vitals:   07/19/14 0849  BP: 112/72  Pulse: 69  Height: 5\' 11"  (1.803 m)  Weight: 230 lb 6.4 oz (104.509 kg)  SpO2: 97%    PHYSICAL EXAM General: NAD HEENT: Normal. Neck: No JVD, no thyromegaly. Lungs: Clear to auscultation bilaterally with normal respiratory effort. CV: Nondisplaced PMI. Irregular rhythm, normal S1/S2, no S3, no murmur. No pretibial or periankle edema. No carotid bruit.  Abdomen: Soft, nontender, no distention.  Neurologic: Alert and oriented x 3.  Psych: Normal affect. Skin: Normal. Musculoskeletal: No gross deformities. Extremities: No clubbing or cyanosis.   ECG: Most recent ECG reviewed.      ASSESSMENT AND PLAN: 1. CAD with RCA stent and residual  disease in small caliber diagonal and obtuse marginal branches: Symptomatically stable. Continue current therapy with ASA, atenolol and statin.   2. Essential HTN: Controlled on present therapy. No changes.  3. Hyperlipidemia: Lipids on 08/29/13 TC 111, TG 113, HDL 33, LDL 55. Continue present dose of statin.  4. Atrial fibrillation: HR is controlled on atenolol 50 mg daily. Event monitoring demonstrated overall good rate control. LV function was normal with moderate to severe left atrial enlargement, indicating a low likelihood of maintaining a normal sinus rhythm. CHADS-VASC score is 3, thus anticoagulation is indicated to reduce thromboembolic risk. Will continue therapy with apixaban 5 mg bid.  5. Possible bicuspid aortic valve: No need for additional imaging at this time.  Would consider repeat echocardiogram in the future when deemed clinically indicated.  Dispo: f/u 6 months.  Kate Sable, M.D., F.A.C.C.

## 2014-07-19 NOTE — Telephone Encounter (Signed)
Medication refilled per protocol. 

## 2014-08-29 DIAGNOSIS — M1712 Unilateral primary osteoarthritis, left knee: Secondary | ICD-10-CM | POA: Diagnosis not present

## 2014-09-05 DIAGNOSIS — M1712 Unilateral primary osteoarthritis, left knee: Secondary | ICD-10-CM | POA: Diagnosis not present

## 2014-09-12 DIAGNOSIS — M1712 Unilateral primary osteoarthritis, left knee: Secondary | ICD-10-CM | POA: Diagnosis not present

## 2014-10-17 DIAGNOSIS — S83282D Other tear of lateral meniscus, current injury, left knee, subsequent encounter: Secondary | ICD-10-CM | POA: Diagnosis not present

## 2014-10-17 DIAGNOSIS — Z4789 Encounter for other orthopedic aftercare: Secondary | ICD-10-CM | POA: Diagnosis not present

## 2014-11-27 DIAGNOSIS — H25813 Combined forms of age-related cataract, bilateral: Secondary | ICD-10-CM | POA: Diagnosis not present

## 2014-11-27 DIAGNOSIS — H524 Presbyopia: Secondary | ICD-10-CM | POA: Diagnosis not present

## 2014-11-28 ENCOUNTER — Other Ambulatory Visit: Payer: Self-pay | Admitting: Orthopedic Surgery

## 2014-12-05 ENCOUNTER — Ambulatory Visit (INDEPENDENT_AMBULATORY_CARE_PROVIDER_SITE_OTHER): Payer: Medicare Other | Admitting: Family Medicine

## 2014-12-05 ENCOUNTER — Encounter: Payer: Self-pay | Admitting: Physician Assistant

## 2014-12-05 ENCOUNTER — Ambulatory Visit (INDEPENDENT_AMBULATORY_CARE_PROVIDER_SITE_OTHER): Payer: Medicare Other | Admitting: Physician Assistant

## 2014-12-05 ENCOUNTER — Encounter: Payer: Self-pay | Admitting: Family Medicine

## 2014-12-05 VITALS — BP 112/66 | HR 68 | Ht 71.0 in | Wt 233.2 lb

## 2014-12-05 VITALS — BP 126/62 | HR 78 | Temp 98.7°F | Resp 14 | Ht 71.0 in | Wt 231.0 lb

## 2014-12-05 DIAGNOSIS — I10 Essential (primary) hypertension: Secondary | ICD-10-CM | POA: Diagnosis not present

## 2014-12-05 DIAGNOSIS — I4891 Unspecified atrial fibrillation: Secondary | ICD-10-CM

## 2014-12-05 DIAGNOSIS — L089 Local infection of the skin and subcutaneous tissue, unspecified: Secondary | ICD-10-CM | POA: Diagnosis not present

## 2014-12-05 DIAGNOSIS — Z1159 Encounter for screening for other viral diseases: Secondary | ICD-10-CM

## 2014-12-05 DIAGNOSIS — S0031XA Abrasion of nose, initial encounter: Secondary | ICD-10-CM | POA: Diagnosis not present

## 2014-12-05 DIAGNOSIS — I209 Angina pectoris, unspecified: Secondary | ICD-10-CM

## 2014-12-05 DIAGNOSIS — I25118 Atherosclerotic heart disease of native coronary artery with other forms of angina pectoris: Secondary | ICD-10-CM

## 2014-12-05 DIAGNOSIS — I4819 Other persistent atrial fibrillation: Secondary | ICD-10-CM

## 2014-12-05 DIAGNOSIS — I481 Persistent atrial fibrillation: Secondary | ICD-10-CM

## 2014-12-05 DIAGNOSIS — E785 Hyperlipidemia, unspecified: Secondary | ICD-10-CM | POA: Diagnosis not present

## 2014-12-05 DIAGNOSIS — R079 Chest pain, unspecified: Secondary | ICD-10-CM | POA: Insufficient documentation

## 2014-12-05 DIAGNOSIS — E669 Obesity, unspecified: Secondary | ICD-10-CM | POA: Diagnosis not present

## 2014-12-05 LAB — COMPREHENSIVE METABOLIC PANEL
ALT: 22 U/L (ref 9–46)
AST: 21 U/L (ref 10–35)
Albumin: 4.3 g/dL (ref 3.6–5.1)
Alkaline Phosphatase: 45 U/L (ref 40–115)
BUN: 12 mg/dL (ref 7–25)
CHLORIDE: 103 mmol/L (ref 98–110)
CO2: 27 mmol/L (ref 20–31)
CREATININE: 1 mg/dL (ref 0.70–1.18)
Calcium: 9.3 mg/dL (ref 8.6–10.3)
GLUCOSE: 95 mg/dL (ref 70–99)
POTASSIUM: 4.9 mmol/L (ref 3.5–5.3)
SODIUM: 140 mmol/L (ref 135–146)
TOTAL PROTEIN: 7.1 g/dL (ref 6.1–8.1)
Total Bilirubin: 1 mg/dL (ref 0.2–1.2)

## 2014-12-05 LAB — CBC WITH DIFFERENTIAL/PLATELET
BASOS ABS: 0.1 10*3/uL (ref 0.0–0.1)
Basophils Relative: 1 % (ref 0–1)
EOS ABS: 0.3 10*3/uL (ref 0.0–0.7)
EOS PCT: 6 % — AB (ref 0–5)
HCT: 43.5 % (ref 39.0–52.0)
Hemoglobin: 15.5 g/dL (ref 13.0–17.0)
Lymphocytes Relative: 35 % (ref 12–46)
Lymphs Abs: 2 10*3/uL (ref 0.7–4.0)
MCH: 33.5 pg (ref 26.0–34.0)
MCHC: 35.6 g/dL (ref 30.0–36.0)
MCV: 94.2 fL (ref 78.0–100.0)
MPV: 9.3 fL (ref 8.6–12.4)
Monocytes Absolute: 0.7 10*3/uL (ref 0.1–1.0)
Monocytes Relative: 12 % (ref 3–12)
Neutro Abs: 2.6 10*3/uL (ref 1.7–7.7)
Neutrophils Relative %: 46 % (ref 43–77)
PLATELETS: 216 10*3/uL (ref 150–400)
RBC: 4.62 MIL/uL (ref 4.22–5.81)
RDW: 14 % (ref 11.5–15.5)
WBC: 5.7 10*3/uL (ref 4.0–10.5)

## 2014-12-05 LAB — LIPID PANEL
CHOLESTEROL: 81 mg/dL — AB (ref 125–200)
HDL: 26 mg/dL — ABNORMAL LOW (ref >=40)
LDL Cholesterol: 40 mg/dL (ref <130)
Total CHOL/HDL Ratio: 3.1 Ratio (ref <=5.0)
Triglycerides: 75 mg/dL (ref <150)
VLDL: 15 mg/dL (ref <30)

## 2014-12-05 MED ORDER — MUPIROCIN 2 % EX OINT: 1.0000 "application " | TOPICAL_OINTMENT | Freq: Two times a day (BID) | CUTANEOUS | Status: DC

## 2014-12-05 MED ORDER — ATENOLOL 50 MG PO TABS: ORAL_TABLET | ORAL | Status: DC

## 2014-12-05 NOTE — Assessment & Plan Note (Signed)
Blood pressure is well-controlled and change medication

## 2014-12-05 NOTE — Patient Instructions (Signed)
Medication Instructions:  Increase atenolol- TAKE 1 TABLET IN THE MORNING(50 mg) & TAKE 1/2 TABLET IN THE EVENING(25MG )  Labwork: NONE  Testing/Procedures: Your physician has requested that you have a lexiscan myoview. For further information please visit [email or web address]. Please follow instruction sheet, as given.    Follow-Up: Your physician recommends that you schedule a follow-up appointment in: [place]   Your physician has recommended that you wear a holter monitor. Holter monitors are medical devices that record the heart's electrical activity. Doctors most often use these monitors to diagnose arrhythmias. Arrhythmias are problems with the speed or rhythm of the heartbeat. The monitor is a small, portable device. You can wear one while you do your normal daily activities. This is usually used to diagnose what is causing palpitations/syncope (passing out).     Any Other Special Instructions Will Be Listed Below (If Applicable).      If you need a refill on your cardiac medications before your next appointment, please call your pharmacy.  Thanks for choosing [facility]!!!

## 2014-12-05 NOTE — Assessment & Plan Note (Signed)
Blood pressure is stable 

## 2014-12-05 NOTE — Patient Instructions (Addendum)
Continue current medications We will call with lab results Use ointment to nares as prescribed [facility] 1:20PM in [place] F/U 6 months

## 2014-12-05 NOTE — Progress Notes (Signed)
Cardiology Office Note   Date:  12/05/2014   ID:  Eddie Price, DOB 1941/01/19, MRN [id number]  PCP:  Eddie Blackbird, MD  Cardiologist:  Dr. Bronson Price  Chief Complaint:chest pain    History of Present Illness: Eddie Price is a 74 y.o. male who presents for chest pain and palpitations. He has a history of chronic atrial fibrillation on atenolol and eliquis. He also has CAD status post PCI to the ostium of a large dominant RCA 06/17/12. He had residual disease in a small caliber diagonal and OM branch. He also has hypertension hyperlipidemia.  In January 2016 he underwent event monitoring that showed an average heart rate of 77 bpm but he did have palpitations chest pain and pressure correlating with heart rates of 110-1 20 bpm range. His atenolol was not changed. Echo showed EF of 55-60% with mild basal septal hypertrophy moderate to severe left atrial enlargement.  The patient comes in today complaining of six-week history of worsening chest pain and palpitations. The worst episode was 2 weeks ago when [place] and tried to walk on [facility]. He had to stop 3 different times because of chest tightness, dyspnea and palpitations. He is not sure which happens first the fast heartbeat or chest pain. He also has symptoms at rest. He'll notice his heart fluttering followed by chest tightness that eases spontaneously within minutes. He has not used nitroglycerin. He is also scheduled for knee replacement December 8.    Past Medical History  Diagnosis Date  . Hypertension   . Hyperlipidemia   . Depression   . History of adenomatous polyp of colon     2009  . Atrial fibrillation [facility])     new on-set Jan 2016--  with good rate control on atenolol  per cardiologist note Dr Eddie Price 04-09-2014  . S/P drug eluting coronary stent placement     RCA x1  06-17-2012  . CAD, multiple vessel cardiologist--  dr Eddie Price    3 vessel CAD //  s/p DES x1 to ostial RCA 06-17-2012   . Anticoagulant long-term use   . Acute meniscal tear of left knee   . OA (osteoarthritis) of knee   . At risk for sleep apnea     STOP-BANG= 4       SENT TO PCP 05-04-2014    Past Surgical History  Procedure Laterality Date  . Percutaneous coronary stent intervention (pci-s) N/A 06/17/2012    Procedure: PERCUTANEOUS CORONARY STENT INTERVENTION (PCI-S);  Surgeon: Burnell Blanks, MD;  Location: [facility] CATH LAB;  Service: Cardiovascular;  Laterality: N/A;  DES x1 ostial RCA  . Knee arthroscopy Right 2005  (approx)  . Left elbow surgery  1969  . Total hip arthroplasty Right 05-25-2006  . Colonoscopy  last one 07-01-2010  . Transthoracic echocardiogram  02-08-2014    mild focal basal hypertrophy of the septum, ef 55-60%,  indeterminate diastolic function in setting of atrial fib./  possibly bicuspid AV, mild calcification without stenosis, trivial AR/  mild MR and PR/  moderate to severe LAE/  trivial PR/  moderate RAE  . Cardiac catheterization  06-15-2012   dr Angelena Form    Triple vessel CAD with severe ostial RCA stenosis, severe stenosis in the small caliber diagonal branch and small caliber OM1, OM2 branches/  preserved LVSF  . Tonsillectomy  1970  . Shoulder arthroscopy with rotator cuff repair Bilateral x2 left//   x1 right (last one , left in 2001)  . Laparoscopic  cholecystectomy  2011    AND REPAIR UMBILICAL HERNIA  . Vasectomy  1970'sj  w/  general anesthesia  . Knee arthroscopy Left 05/08/2014    Procedure: LEFT ARTHROSCOPY KNEE WIGHT DEBRIDEMENT PARTIAL LATERAL MENISCECTOMY AND CHONDROPLASTY;  Surgeon: Sydnee Cabal, MD;  Location: [facility];  Service: Orthopedics;  Laterality: Left;     Current Outpatient Prescriptions  Medication Sig Dispense Refill  . apixaban (ELIQUIS) 5 MG TABS tablet Take 1 tablet (5 mg total) by mouth 2 (two) times daily. 180 tablet 3  . aspirin 81 MG tablet Take 81 mg by mouth daily.    Marland Kitchen atenolol (TENORMIN) 50 MG tablet Take 1  tablet (50 mg total) by mouth daily. (Patient taking differently: Take 50 mg by mouth every morning. ) 90 tablet 3  . atorvastatin (LIPITOR) 40 MG tablet TAKE ONE-HALF TABLET BY MOUTH ONCE DAILY AT  6  PM 90 tablet 1  . Multiple Vitamin (MULTIVITAMIN) capsule Take 1 capsule by mouth daily.    . mupirocin ointment (BACTROBAN) 2 % Place 1 application into the nose 2 (two) times daily. For 5 days 22 g 0  . nitroGLYCERIN (NITROSTAT) 0.4 MG SL tablet Place 1 tablet (0.4 mg total) under the tongue every 5 (five) minutes x 3 doses as needed for chest pain. 30 tablet 3  . Omega 3 1000 MG CAPS Take 2,000 mg by mouth 2 (two) times daily.    . sertraline (ZOLOFT) 100 MG tablet TAKE ONE TABLET BY MOUTH DAILY 90 tablet 1  . vitamin E (VITAMIN E) 400 UNIT capsule Take 800 Units by mouth daily.     No current facility-administered medications for this visit.    Allergies:   Sulfa antibiotics    Social History:  The patient  reports that he quit smoking about 44 years ago. He started smoking about 55 years ago. He quit smokeless tobacco use about 44 years ago. He reports that he does not drink alcohol or use illicit drugs.   Family History:  The patient's    family history includes COPD in his mother; Cancer in his maternal grandmother; Depression in his mother; Heart attack in his father; Heart disease in his father; Heart failure in his father and mother; Hyperlipidemia in his father; Mental illness in his mother; Stroke in his father.    ROS:  Please see the history of present illness.   Otherwise, review of systems are positive for none.   All other systems are reviewed and negative.    PHYSICAL EXAM: VS:  BP 112/66 mmHg  Pulse 68  Ht 5\' 11"  (1.803 m)  Wt 233 lb 3.2 oz (105.779 kg)  BMI 32.54 kg/m2  SpO2 97% , BMI Body mass index is 32.54 kg/(m^2). GEN: Well nourished, well developed, in no acute distress Neck: no JVD, HJR, carotid bruits, or masses Cardiac: Irregular irregular; 1 to 2/6 systolic  murmur at the left sternal border, no gallop, rubs, thrill or heave,  Respiratory:  clear to auscultation bilaterally, normal work of breathing GI: soft, nontender, nondistended, + BS MS: no deformity or atrophy Extremities: without cyanosis, clubbing, edema, good distal pulses bilaterally.  Skin: warm and dry, no rash Neuro:  Strength and sensation are intact    EKG:  EKG is ordered today. The ekg ordered today demonstrates atrial fibrillation at 71 bpm, no acute change   Recent Labs: 01/31/2014: ALT 26; TSH 1.279 06/04/2014: BUN 14; Creat 1.14; Hemoglobin 15.7; Platelets 208; Potassium 5.0; Sodium 140  Lipid Panel    Component Value Date/Time   CHOL 93 06/04/2014 1047   TRIG 88 06/04/2014 1047   HDL 28* 06/04/2014 1047   CHOLHDL 3.3 06/04/2014 1047   VLDL 18 06/04/2014 1047   LDLCALC 47 06/04/2014 1047      Wt Readings from Last 3 Encounters:  12/05/14 233 lb 3.2 oz (105.779 kg)  12/05/14 231 lb (104.781 kg)  07/19/14 230 lb 6.4 oz (104.509 kg)      Other studies Reviewed: Additional studies/ records that were reviewed today include and review of the records demonstrates:  Cardiac catheterization 06/15/12 Angiographic Findings:  Left main: No obstructive disease.    Left Anterior Descending Artery: Moderate caliber vessel that courses to the apex. The proximal vessel has mild plaque disease. The mid vessel has a 30% stenosis at the takeoff of the small caliber diagonal branch. The remainder of the LAD has mild plaque disease. The small caliber diagonal branch (1.5-1.75 mm) has a focal 99% stenosis.    Circumflex Artery: Small to moderate caliber vessel with 20% ostial stenosis. The first obtuse marginal branch is small in caliber (1.5 -1.75 mm) with proximal 80% stenosis. The second obtuse marginal branch is small in caliber (1.5 mm vessel) with focal 99% stenosis.    Right Coronary Artery: Large caliber, dominant vessel with 99% ostial stenosis. The mid vessel has a  30-40% stenosis. The distal vessel has an eccentric 20% stenosis. The PDA is a small caliber vessel with no obstructive disease. The posterolateral branch is a medium caliber vessel with no obstructive disease.   Left Ventricular Angiogram: LVEF 55%.   Impression: 1. 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   2. Preserved LV systolic function 3. Unstable angina, class III.    Recommendations: He has three vessel CAD. He is having unstable symptoms. Will admit to telemetry unit. Will start ASA and begin a heparin drip. Will start statin/beta blocker. Will review films with colleagues. He will need revascularization with stenting or CABG. His branch vessels are small in caliber and I am not sure CABG would give a good result. His LAD has no focal lesions. I am leaning toward PCI of the RCA in the next 1-2 days depending on the cath lab schedule and then reassess the lesions in the left system. It appears that the stenoses in the OM branches and diagonal branch are severe but the vessels are small in caliber. Will wait to load with Plavix until CABG has been excluded as an option for therapy.          Complications:  None. The patient tolerated the procedure well.                    2-D echo 02/08/14 Study Conclusions  - Left ventricle: The cavity size was normal. There was mild focal   basal hypertrophy of the septum. Systolic function was normal.   The estimated ejection fraction was in the range of 55% to 60%.   Wall motion was normal; there were no regional wall motion   abnormalities. The study is not technically sufficient to allow   evaluation of LV diastolic function. - Aortic valve: Possibly bicuspid; mildly calcified leaflets. There   was no stenosis. There was trivial regurgitation. Mean gradient   (S): 4 mm Hg. - Aortic root: The aortic root was mildly ectatic. -  Mitral valve: Calcified annulus.  There was mild regurgitation. - Left atrium: The atrium was moderately to severely dilated. - Right atrium: The atrium was moderately dilated. Central venous   pressure (est): 3 mm Hg. - Atrial septum: The septum bowed from left to right, consistent   with increased left atrial pressure. No defect or patent foramen   ovale was identified. - Tricuspid valve: There was trivial regurgitation. - Pulmonary arteries: PA peak pressure: 24 mm Hg (S). - Pericardium, extracardiac: There was no pericardial effusion.  Impressions:  - Mild basal septal hypertrophy with LVEF 55-60%. Indeterminate   diastolic function in the setting of atrial fibrillation, likely   increased left atrial pressure however. Moderate to severe left   atrial enlargement. Mild mitral regurgitation. Possibly bicuspid   aortic valve, calcified without stenosis. Trivial aortic   regurgitation noted. Moderate right atrial enlargement. PASP   estimated 24 mmHg.     ASSESSMENT AND PLAN:  Chest pain Patient comes in today complaining of six-week history of exertional chest tightness as well as chest tightness at rest associated with palpitations. He does have history of coronary artery disease with PCI of the RCA in 2014 with residual disease. He also had chest pain that correlated with heart rates in the 110-120 bpm on an event monitor in January 2016. Because of this I will increase his atenolol to 50 mg in the morning 20 5 in the evening. I will order a Lexi scan Myoview to rule out ischemia. I will also place a 48 hour monitor on him to rule out uncontrolled atrial fibrillation. Follow-up with Dr.Koneswaran in 2 weeks. He is advised to call 911 or go to nearest emergency room if he has any prolonged chest pain. I'm also available to see him next week if needed. Patient is also scheduled for knee surgery in December. This will help enable [place] to clear him for surgery.  Coronary atherosclerosis of  native coronary artery Patient had PCI to the large RCA eyes/23/14. Now having angina for the past 6 weeks. Order Lexi scan Myoview to rule out ischemia.  Atrial fibrillation Patient with chronic atrial fibrillation. He is having some palpitations now and may be having fast heart rates. Increase atenolol to 50 mg in the morning 25 in the evening. Place an event monitor on him to rule out rapid atrial fibrillation.  Essential hypertension, benign Blood pressure is stable    Signed, Ermalinda Barrios, PA-C  12/05/2014 1:23 PM    [facility] [address], [place], [place]  [address] Phone: [phone]; Fax: [phone]

## 2014-12-05 NOTE — Assessment & Plan Note (Signed)
Patient with chronic atrial fibrillation. He is having some palpitations now and may be having fast heart rates. Increase atenolol to 50 mg in the morning 25 in the evening. Place an event monitor on him to rule out rapid atrial fibrillation.

## 2014-12-05 NOTE — Assessment & Plan Note (Addendum)
Patient comes in today complaining of six-week history of exertional chest tightness as well as chest tightness at rest associated with palpitations. He does have history of coronary artery disease with PCI of the RCA in 2014 with residual disease. He also had chest pain that correlated with heart rates in the 110-120 bpm on an event monitor in January 2016. Because of this I will increase his atenolol to 50 mg in the morning 20 5 in the evening. I will order a Lexi scan Myoview to rule out ischemia. I will also place a 48 hour monitor on him to rule out uncontrolled atrial fibrillation. Follow-up with Dr.Koneswaran in 2 weeks. He is advised to call 911 or go to nearest emergency room if he has any prolonged chest pain. I'm also available to see him next week if needed. Patient is also scheduled for knee surgery in December. This will help enable [place] to clear him for surgery.

## 2014-12-05 NOTE — Assessment & Plan Note (Signed)
PAF episodes, Atenolol and Eliquis, with persistent episodes of chest pain. Concerned about him undergoing anesthesia for his knee surgery. I called and spoke to his cardiologist he will be seen this afternoon. He is rate controlled

## 2014-12-05 NOTE — Progress Notes (Signed)
Patient ID: Eddie Price, male   DOB: April 07, 1940, 74 y.o.   MRN: [id number]   Subjective:    Patient ID: Eddie Price, male    DOB: 1940-04-30, 74 y.o.   MRN: [id number]  Patient presents for 6 month F/u  patient and came into follow-up chronic medical problems. He is due to have arthroscopy on his knee in December. He has however been having increased episodes of paroxysmal atrial fibrillation over past 4 weeks as well as chest pains which she calls fingers. At times he will get flushing he gets short of breath with these episodes as well. He now gets chest discomfort with little exertion. He is here today with his wife who also admitted to the symptoms. He has not had any nausea vomiting associated. He has not used any nitroglycerin as his symptoms are short-lived. He is on Eliquis as well as atenolol for his atrial fibrillation.  He also complains of a sore in his right nares that has been present for a couple months. When he blows really hard or scratches that it will bleed but it has not healed up completely.  Review Of Systems:  GEN- denies fatigue, fever, weight loss,weakness, recent illness HEENT- denies eye drainage, change in vision, nasal discharge, CVS- +chest pain, +palpitations RESP- + SOB, cough, wheeze ABD- denies N/V, change in stools, abd pain GU- denies dysuria, hematuria, dribbling, incontinence MSK- + joint pain, muscle aches, injury Neuro- denies headache, dizziness, syncope, seizure activity       Objective:    BP 126/62 mmHg  Pulse 78  Temp(Src) 98.7 F (37.1 C) (Oral)  Resp 14  Ht 5\' 11"  (1.803 m)  Wt 231 lb (104.781 kg)  BMI 32.23 kg/m2 GEN- NAD, alert and oriented x3 HEENT- PERRL, EOMI, non injected sclera, pink conjunctiva, MMM, oropharynx clear, nares left clear, right abrasion with scab, no drainage noted, NT CVS- irregular rhythem, normal rate, no murmur RESP-CTAB ABD-NABS,soft,NT,ND EXT- No edema Pulses- Radial - 2+         Assessment &  Plan:      Problem List Items Addressed This Visit    Obesity   Hyperlipidemia (Chronic)   Relevant Orders   Lipid panel   Essential hypertension, benign - Primary (Chronic)    Blood pressure is well-controlled and change medication      Relevant Orders   CBC with Differential/Platelet   Comprehensive metabolic panel   Coronary atherosclerosis of native coronary artery (Chronic)   Atrial fibrillation (HCC) (Chronic)    PAF episodes, Atenolol and Eliquis, with persistent episodes of chest pain. Concerned about him undergoing anesthesia for his knee surgery. I called and spoke to his cardiologist he will be seen this afternoon. He is rate controlled       Other Visit Diagnoses    Need for hepatitis C screening test        Relevant Orders    Hepatitis C antibody, reflex    Infected nasal abrasion, initial encounter        Relevant Medications    mupirocin ointment (BACTROBAN) 2 %       Note: This dictation was prepared with Dragon dictation along with smaller phrase technology. Any transcriptional errors that result from this process are unintentional.

## 2014-12-05 NOTE — Assessment & Plan Note (Signed)
Patient had PCI to the large RCA eyes/23/14. Now having angina for the past 6 weeks. Order Lexi scan Myoview to rule out ischemia.

## 2014-12-06 LAB — HEPATITIS C ANTIBODY: HCV Ab: NEGATIVE

## 2014-12-07 ENCOUNTER — Ambulatory Visit (HOSPITAL_COMMUNITY)
Admission: RE | Admit: 2014-12-07 | Discharge: 2014-12-07 | Disposition: A | Discharge status: Home / Self Care | Payer: Medicare Other | Source: Ambulatory Visit | Attending: Physician Assistant | Admitting: Physician Assistant

## 2014-12-07 ENCOUNTER — Inpatient Hospital Stay (HOSPITAL_COMMUNITY): Admission: RE | Admit: 2014-12-07 | Payer: Self-pay | Source: Ambulatory Visit

## 2014-12-07 ENCOUNTER — Encounter (HOSPITAL_COMMUNITY)
Admission: RE | Admit: 2014-12-07 | Discharge: 2014-12-07 | Disposition: A | Discharge status: Home / Self Care | Payer: Medicare Other | Source: Ambulatory Visit | Attending: Physician Assistant | Admitting: Physician Assistant

## 2014-12-07 ENCOUNTER — Encounter (HOSPITAL_COMMUNITY): Payer: Self-pay

## 2014-12-07 ENCOUNTER — Encounter (HOSPITAL_BASED_OUTPATIENT_CLINIC_OR_DEPARTMENT_OTHER)
Admission: RE | Admit: 2014-12-07 | Discharge: 2014-12-07 | Disposition: A | Discharge status: Home / Self Care | Payer: Medicare Other | Source: Ambulatory Visit | Attending: Physician Assistant | Admitting: Physician Assistant

## 2014-12-07 DIAGNOSIS — I4891 Unspecified atrial fibrillation: Secondary | ICD-10-CM | POA: Diagnosis not present

## 2014-12-07 LAB — NM MYOCAR MULTI W/SPECT W/WALL MOTION / EF
CHL CUP NUCLEAR SRS: 3
LHR: 0.74
LV sys vol: 44 mL
LVDIAVOL: 107 mL
Peak HR: 81 {beats}/min
Rest HR: 63 {beats}/min
SDS: 0
SSS: 3
TID: 1.3

## 2014-12-07 MED ORDER — TECHNETIUM TC 99M SESTAMIBI - CARDIOLITE
30.0000 | Freq: Once | INTRAVENOUS | Status: AC | PRN
  Administered 2014-12-07: 30 via INTRAVENOUS

## 2014-12-07 MED ORDER — SODIUM CHLORIDE 0.9 % IJ SOLN
INTRAMUSCULAR | Status: AC
  Filled 2014-12-07: qty 3

## 2014-12-07 MED ORDER — TECHNETIUM TC 99M SESTAMIBI GENERIC - CARDIOLITE
10.0000 | Freq: Once | INTRAVENOUS | Status: AC | PRN
  Administered 2014-12-07: 10 via INTRAVENOUS

## 2014-12-07 MED ORDER — REGADENOSON 0.4 MG/5ML IV SOLN
INTRAVENOUS | Status: AC
  Administered 2014-12-07: 0.4 mg
  Filled 2014-12-07: qty 5

## 2014-12-13 ENCOUNTER — Telehealth: Payer: Self-pay | Admitting: Cardiovascular Disease

## 2014-12-13 NOTE — Telephone Encounter (Signed)
Mr. Stupak was returning a phone call from yesterday. pls call him on his cell phone [phone]

## 2014-12-14 ENCOUNTER — Ambulatory Visit (INDEPENDENT_AMBULATORY_CARE_PROVIDER_SITE_OTHER): Payer: Medicare Other | Admitting: Cardiovascular Disease

## 2014-12-14 VITALS — BP 122/78 | HR 97 | Ht 71.0 in | Wt 229.0 lb

## 2014-12-14 DIAGNOSIS — Q231 Congenital insufficiency of aortic valve: Secondary | ICD-10-CM

## 2014-12-14 DIAGNOSIS — Z955 Presence of coronary angioplasty implant and graft: Secondary | ICD-10-CM

## 2014-12-14 DIAGNOSIS — I10 Essential (primary) hypertension: Secondary | ICD-10-CM

## 2014-12-14 DIAGNOSIS — I209 Angina pectoris, unspecified: Secondary | ICD-10-CM

## 2014-12-14 DIAGNOSIS — I4891 Unspecified atrial fibrillation: Secondary | ICD-10-CM

## 2014-12-14 DIAGNOSIS — E785 Hyperlipidemia, unspecified: Secondary | ICD-10-CM

## 2014-12-14 MED ORDER — RANOLAZINE ER 500 MG PO TB12: 500.0000 mg | ORAL_TABLET | Freq: Two times a day (BID) | ORAL | Status: DC

## 2014-12-14 NOTE — Patient Instructions (Addendum)
Your physician recommends that you schedule a follow-up appointment in: 3 Months with Dr Bronson Ing   START Ranexa 500 mg twice a day and take for 1 month   Call us back and give [place] update   We may increase to 1,000 mg twice a day   If you need a refill on your cardiac medications before your next appointment, please call your pharmacy.     Thank you for choosing [facility] !

## 2014-12-14 NOTE — Progress Notes (Signed)
Patient ID: Eddie Price, male   DOB: [birth year], 74 y.o.   MRN: [id number]      SUBJECTIVE: The patient returns for follow-up after undergoing cardiovascular testing performed for the evaluation of chest pain. Nuclear stress test was low risk but did demonstrate inferior wall ischemia. Holter monitoring showed rate-controlled atrial fibrillation with PVC's. Atenolol dose increased by Gerrianne Scale PA-C earlier this month.  He has not noticed much of a difference. Has "stinging chest pains" about 5-6 times per day.   Review of Systems: As per "subjective", otherwise negative.  Allergies  Allergen Reactions  . Sulfa Antibiotics Rash    Current Outpatient Prescriptions  Medication Sig Dispense Refill  . apixaban (ELIQUIS) 5 MG TABS tablet Take 1 tablet (5 mg total) by mouth 2 (two) times daily. 180 tablet 3  . aspirin 81 MG tablet Take 81 mg by mouth daily.    Marland Kitchen atenolol (TENORMIN) 50 MG tablet Take 1 tablet in the morning (50mg ) and take 1/2 tablet in the evening(25mg ) 135 tablet 3  . atorvastatin (LIPITOR) 40 MG tablet TAKE ONE-HALF TABLET BY MOUTH ONCE DAILY AT  6  PM 90 tablet 1  . Multiple Vitamin (MULTIVITAMIN) capsule Take 1 capsule by mouth daily.    . mupirocin ointment (BACTROBAN) 2 % Place 1 application into the nose 2 (two) times daily. For 5 days 22 g 0  . nitroGLYCERIN (NITROSTAT) 0.4 MG SL tablet Place 1 tablet (0.4 mg total) under the tongue every 5 (five) minutes x 3 doses as needed for chest pain. 30 tablet 3  . Omega 3 1000 MG CAPS Take 2,000 mg by mouth 2 (two) times daily.    . sertraline (ZOLOFT) 100 MG tablet TAKE ONE TABLET BY MOUTH DAILY 90 tablet 1  . vitamin E (VITAMIN E) 400 UNIT capsule Take 800 Units by mouth daily.    . ranolazine (RANEXA) 500 MG 12 hr tablet Take 1 tablet (500 mg total) by mouth 2 (two) times daily. 60 tablet 0   No current facility-administered medications for this visit.    Past Medical History  Diagnosis Date  . Hypertension   .  Hyperlipidemia   . Depression   . History of adenomatous polyp of colon     2009  . Atrial fibrillation [facility])     new on-set Jan 2016--  with good rate control on atenolol  per cardiologist note Dr Bronson Ing 04-09-2014  . S/P drug eluting coronary stent placement     RCA x1  06-17-2012  . CAD, multiple vessel cardiologist--  dr Bronson Ing    3 vessel CAD //  s/p DES x1 to ostial RCA 06-17-2012  . Anticoagulant long-term use   . Acute meniscal tear of left knee   . OA (osteoarthritis) of knee   . At risk for sleep apnea     STOP-BANG= 4       SENT TO PCP 05-04-2014    Past Surgical History  Procedure Laterality Date  . Percutaneous coronary stent intervention (pci-s) N/A 06/17/2012    Procedure: PERCUTANEOUS CORONARY STENT INTERVENTION (PCI-S);  Surgeon: Burnell Blanks, MD;  Location: [facility] CATH LAB;  Service: Cardiovascular;  Laterality: N/A;  DES x1 ostial RCA  . Knee arthroscopy Right 2005  (approx)  . Left elbow surgery  1969  . Total hip arthroplasty Right 05-25-2006  . Colonoscopy  last one 07-01-2010  . Transthoracic echocardiogram  02-08-2014    mild focal basal hypertrophy of the septum, ef 55-60%,  indeterminate diastolic  function in setting of atrial fib./  possibly bicuspid AV, mild calcification without stenosis, trivial AR/  mild MR and PR/  moderate to severe LAE/  trivial PR/  moderate RAE  . Cardiac catheterization  06-15-2012   dr Angelena Form    Triple vessel CAD with severe ostial RCA stenosis, severe stenosis in the small caliber diagonal branch and small caliber OM1, OM2 branches/  preserved LVSF  . Tonsillectomy  1970  . Shoulder arthroscopy with rotator cuff repair Bilateral x2 left//   x1 right (last one , left in 2001)  . Laparoscopic cholecystectomy  2011    AND REPAIR UMBILICAL HERNIA  . Vasectomy  1970'sj  w/  general anesthesia  . Knee arthroscopy Left 05/08/2014    Procedure: LEFT ARTHROSCOPY KNEE WIGHT DEBRIDEMENT PARTIAL LATERAL MENISCECTOMY AND  CHONDROPLASTY;  Surgeon: Sydnee Cabal, MD;  Location: [facility];  Service: Orthopedics;  Laterality: Left;    Social History   Social History  . Marital Status: Married    Spouse Name: N/A  . Number of Children: N/A  . Years of Education: N/A   Occupational History  . Not on file.   Social History Main Topics  . Smoking status: Former Smoker -- 1.00 packs/day for 10 years    Start date: 02/02/1959    Quit date: 06/16/1970  . Smokeless tobacco: Former Systems developer    Quit date: 01/26/1970  . Alcohol Use: No     Comment: RARE  . Drug Use: No  . Sexual Activity: Not on file   Other Topics Concern  . Not on file   Social History Narrative     Filed Vitals:   12/14/14 1252  BP: 122/78  Pulse: 97  Height: 5\' 11"  (1.803 m)  Weight: 229 lb (103.874 kg)  SpO2: 97%    PHYSICAL EXAM General: NAD HEENT: Normal. Neck: No JVD, no thyromegaly. Lungs: Clear to auscultation bilaterally with normal respiratory effort. CV: Nondisplaced PMI. Irregular rhythm, normal S1/S2, no S3, no murmur. No pretibial or periankle edema.  Abdomen: Soft, nontender, no distention.  Neurologic: Alert and oriented x 3.  Psych: Normal affect. Skin: Normal. Musculoskeletal: No gross deformities. Extremities: No clubbing or cyanosis.   ECG: Most recent ECG reviewed.      ASSESSMENT AND PLAN: 1. Chest pain in context of CAD with RCA stent and residual disease in small caliber diagonal and obtuse marginal branches: No change with increase in atenolol dose. Low risk nuclear study with inferior wall ischemia as noted above. Did have mid 40% RCA lesion in 2014. Will attempt Ranexa 500 mg bid x 1 month. If no relief of symptoms, will increase to1000 mg bid x 1 month and reassess. Continue ASA and statin.  2. Atrial fibrillation: Rate controlled on atenolol. Continue Eliquis.  3. Essential HTN: Controlled. No changes.  4. Hyperlipidemia: Continue statin.  5. Possible bicuspid  aortic valve: No need for additional imaging at this time. Would consider repeat echocardiogram in the future when deemed clinically indicated.  F/u 3 months.   Kate Sable, M.D., F.A.C.C.

## 2014-12-17 ENCOUNTER — Telehealth: Payer: Self-pay

## 2014-12-17 NOTE — Telephone Encounter (Signed)
-----   Message from Herminio Commons, MD sent at 12/14/2014  4:34 PM EST ----- Regarding: Low to intermediate risk for knee surgery Can proceed with current medical therapy.

## 2014-12-17 NOTE — Telephone Encounter (Signed)
Will forward to Dr [facility]

## 2014-12-24 NOTE — H&P (Signed)
TOTAL KNEE ADMISSION H&P  Patient is being admitted for left total knee arthroplasty.  Subjective:  Chief Complaint:left knee pain.  HPI: Eddie Price, 74 y.o. male, has a history of pain and functional disability in the left knee due to arthritis and has failed non-surgical conservative treatments for greater than 12 weeks to includeNSAID's and/or analgesics, corticosteriod injections, viscosupplementation injections, supervised PT with diminished ADL's post treatment, use of assistive devices and activity modification.  Onset of symptoms was gradual, starting 4 years ago with gradually worsening course since that time. The patient noted no past surgery on the left knee(s).  Patient currently rates pain in the left knee(s) at 7 out of 10 with activity. Patient has pain that interferes with activities of daily living, pain with passive range of motion and joint swelling.  Patient has evidence of subchondral sclerosis, periarticular osteophytes and joint space narrowing by imaging studies. This patient has had osteoarthritis. There is no active infection.  Patient Active Problem List   Diagnosis Date Noted  . Chest pain 12/05/2014  . S/P left knee arthroscopy 05/08/2014  . Atrial fibrillation ([place]) 01/31/2014  . Olecranon bursitis 08/29/2013  . OA (osteoarthritis) of knee 08/29/2013  . Depression with anxiety 10/22/2012  . Obesity 10/22/2012  . Hyperlipidemia   . Postoperative ecchymosis 06/23/2012  . Coronary atherosclerosis of native coronary artery 06/16/2012  . Unstable angina ([place]) 06/16/2012  . Esophageal reflux 06/13/2012  . Essential hypertension, benign 06/13/2012  . Impotence of organic origin 06/13/2012  . Degeneration of cervical intervertebral disc 06/13/2012   Past Medical History  Diagnosis Date  . Hypertension   . Hyperlipidemia   . Depression   . History of adenomatous polyp of colon     2009  . Atrial fibrillation [facility])     new on-set Jan 2016--  with good rate  control on atenolol  per cardiologist note Dr Bronson Ing 04-09-2014  . S/P drug eluting coronary stent placement     RCA x1  06-17-2012  . CAD, multiple vessel cardiologist--  dr Bronson Ing    3 vessel CAD //  s/p DES x1 to ostial RCA 06-17-2012  . Anticoagulant long-term use   . Acute meniscal tear of left knee   . OA (osteoarthritis) of knee   . At risk for sleep apnea     STOP-BANG= 4       SENT TO PCP 05-04-2014    Past Surgical History  Procedure Laterality Date  . Percutaneous coronary stent intervention (pci-s) N/A 06/17/2012    Procedure: PERCUTANEOUS CORONARY STENT INTERVENTION (PCI-S);  Surgeon: Burnell Blanks, MD;  Location: [facility] CATH LAB;  Service: Cardiovascular;  Laterality: N/A;  DES x1 ostial RCA  . Knee arthroscopy Right 2005  (approx)  . Left elbow surgery  1969  . Total hip arthroplasty Right 05-25-2006  . Colonoscopy  last one 07-01-2010  . Transthoracic echocardiogram  02-08-2014    mild focal basal hypertrophy of the septum, ef 55-60%,  indeterminate diastolic function in setting of atrial fib./  possibly bicuspid AV, mild calcification without stenosis, trivial AR/  mild MR and PR/  moderate to severe LAE/  trivial PR/  moderate RAE  . Cardiac catheterization  06-15-2012   dr Angelena Form    Triple vessel CAD with severe ostial RCA stenosis, severe stenosis in the small caliber diagonal branch and small caliber OM1, OM2 branches/  preserved LVSF  . Tonsillectomy  1970  . Shoulder arthroscopy with rotator cuff repair Bilateral x2 left//   x1  right (last one , left in 2001)  . Laparoscopic cholecystectomy  2011    AND REPAIR UMBILICAL HERNIA  . Vasectomy  1970'sj  w/  general anesthesia  . Knee arthroscopy Left 05/08/2014    Procedure: LEFT ARTHROSCOPY KNEE WIGHT DEBRIDEMENT PARTIAL LATERAL MENISCECTOMY AND CHONDROPLASTY;  Surgeon: Sydnee Cabal, MD;  Location: [facility];  Service: Orthopedics;  Laterality: Left;    No prescriptions prior to  admission   Allergies  Allergen Reactions  . Sulfa Antibiotics Rash    Social History  Substance Use Topics  . Smoking status: Former Smoker -- 1.00 packs/day for 10 years    Start date: 02/02/1959    Quit date: 06/16/1970  . Smokeless tobacco: Former Systems developer    Quit date: 01/26/1970  . Alcohol Use: No     Comment: RARE    Family History  Problem Relation Age of Onset  . Heart failure Mother   . COPD Mother   . Depression Mother   . Mental illness Mother   . Heart failure Father   . Stroke Father   . Heart attack Father   . Heart disease Father   . Hyperlipidemia Father   . Cancer Maternal Grandmother     cervical     Review of Systems  Constitutional: Negative.   HENT: Negative.   Eyes: Negative.   Respiratory: Negative.   Cardiovascular: Negative.   Gastrointestinal: Negative.   Genitourinary: Negative.   Musculoskeletal: Positive for joint pain.  Skin: Negative.   Neurological: Negative.   Endo/Heme/Allergies: Negative.   Psychiatric/Behavioral: Negative.     Objective:  Physical Exam  Constitutional: He is oriented to person, place, and time. He appears well-developed.  HENT:  Head: Normocephalic.  Eyes: EOM are normal.  Neck: Normal range of motion.  Cardiovascular: Normal rate, normal heart sounds and intact distal pulses.   Respiratory: Effort normal and breath sounds normal.  GI: Soft. Bowel sounds are normal.  Musculoskeletal:  Left knee pain with rom. Calf soft and non tender. LLE grossly n/v intact.  Neurological: He is alert and oriented to person, place, and time.  Skin: Skin is warm and dry.  Psychiatric: His behavior is normal.    Vital signs in last 24 hours:    Labs:   Estimated body mass index is 32.15 kg/(m^2) as calculated from the following:   Height as of 07/19/14: 5\' 11"  (1.803 m).   Weight as of 07/19/14: 104.509 kg (230 lb 6.4 oz).   Imaging Review Plain radiographs demonstrate moderate degenerative joint disease of the  left knee(s). The overall alignment ismild varus. The bone quality appears to be good for age and reported activity level.  Assessment/Plan:  End stage arthritis, left knee   The patient history, physical examination, clinical judgment of the provider and imaging studies are consistent with end stage degenerative joint disease of the left knee(s) and total knee arthroplasty is deemed medically necessary. The treatment options including medical management, injection therapy arthroscopy and arthroplasty were discussed at length. The risks and benefits of total knee arthroplasty were presented and reviewed. The risks due to aseptic loosening, infection, stiffness, patella tracking problems, thromboembolic complications and other imponderables were discussed. The patient acknowledged the explanation, agreed to proceed with the plan and consent was signed. Patient is being admitted for inpatient treatment for surgery, pain control, PT, OT, prophylactic antibiotics, VTE prophylaxis, progressive ambulation and ADL's and discharge planning. The patient is planning to be discharged home with home health services.

## 2014-12-28 ENCOUNTER — Encounter (HOSPITAL_COMMUNITY): Payer: Self-pay

## 2014-12-28 ENCOUNTER — Encounter (HOSPITAL_COMMUNITY)
Admission: RE | Admit: 2014-12-28 | Discharge: 2014-12-28 | Disposition: A | Discharge status: Home / Self Care | Payer: Medicare Other | Source: Ambulatory Visit | Attending: Specialist | Admitting: Specialist

## 2014-12-28 DIAGNOSIS — Z01818 Encounter for other preprocedural examination: Secondary | ICD-10-CM | POA: Insufficient documentation

## 2014-12-28 DIAGNOSIS — M179 Osteoarthritis of knee, unspecified: Secondary | ICD-10-CM | POA: Diagnosis not present

## 2014-12-28 HISTORY — DX: Reserved for inherently not codable concepts without codable children: IMO0001

## 2014-12-28 HISTORY — DX: Anxiety disorder, unspecified: F41.9

## 2014-12-28 LAB — CBC
HCT: 43.9 % (ref 39.0–52.0)
Hemoglobin: 15 g/dL (ref 13.0–17.0)
MCH: 33 pg (ref 26.0–34.0)
MCHC: 34.2 g/dL (ref 30.0–36.0)
MCV: 96.5 fL (ref 78.0–100.0)
PLATELETS: 197 10*3/uL (ref 150–400)
RBC: 4.55 MIL/uL (ref 4.22–5.81)
RDW: 12.9 % (ref 11.5–15.5)
WBC: 6.8 10*3/uL (ref 4.0–10.5)

## 2014-12-28 LAB — BASIC METABOLIC PANEL
Anion gap: 4 — ABNORMAL LOW (ref 5–15)
BUN: 15 mg/dL (ref 6–20)
CHLORIDE: 104 mmol/L (ref 101–111)
CO2: 29 mmol/L (ref 22–32)
CREATININE: 1.11 mg/dL (ref 0.61–1.24)
Calcium: 9.3 mg/dL (ref 8.9–10.3)
GFR calc Af Amer: >60 mL/min (ref >60)
GFR calc non Af Amer: >60 mL/min (ref >60)
Glucose, Bld: 102 mg/dL — ABNORMAL HIGH (ref 65–99)
Potassium: 4.9 mmol/L (ref 3.5–5.1)
SODIUM: 137 mmol/L (ref 135–145)

## 2014-12-28 LAB — URINALYSIS, ROUTINE W REFLEX MICROSCOPIC
Bilirubin Urine: NEGATIVE
Glucose, UA: NEGATIVE mg/dL
Hgb urine dipstick: NEGATIVE
Ketones, ur: NEGATIVE mg/dL
LEUKOCYTES UA: NEGATIVE
NITRITE: NEGATIVE
Protein, ur: NEGATIVE mg/dL
SPECIFIC GRAVITY, URINE: 1.014 (ref 1.005–1.030)
pH: 6.5 (ref 5.0–8.0)

## 2014-12-28 LAB — PROTIME-INR
INR: 1.17 (ref 0.00–1.49)
Prothrombin Time: 15.1 seconds (ref 11.6–15.2)

## 2014-12-28 LAB — SURGICAL PCR SCREEN
MRSA, PCR: NEGATIVE
Staphylococcus aureus: NEGATIVE

## 2014-12-28 LAB — APTT: aPTT: 42 seconds — ABNORMAL HIGH (ref 24–37)

## 2014-12-28 NOTE — Progress Notes (Signed)
Clearance note per chart per Dr Bronson Ing 07/19/2014  Clearance note per chart per Dr Buelah Manis 06/04/2014  EKG/epic 12/05/2014 Stress test / epic 12/07/2014 ECHO / epic 02/08/2014

## 2014-12-28 NOTE — Progress Notes (Signed)
PTT results in epic per PAT visit 12/28/2014 sent to Dr Theda Sers

## 2014-12-28 NOTE — Patient Instructions (Signed)
DESTEN DETHLEFSEN  12/28/2014   Your procedure is scheduled on: Thursday January 03, 2015   Report to [facility] Main  Entrance take [place]  elevators to 3rd floor to  [place] at 5:30 AM.  Call this number if you have problems the morning of surgery [phone]   Remember: ONLY 1 PERSON MAY GO WITH YOU TO SHORT STAY TO GET  READY MORNING OF [place].  Do not eat food or drink liquids :After Midnight.     Take these medicines the morning of surgery with A SIP OF WATER: Atenolol; Ranexa; Sertraline (Zoloft);    Continue aspirin per Dr Bronson Ing instruction               Discontinue eliquis 48 hours prior to surgery                               You may not have any metal on your body including hair pins and              piercings  Do not wear jewelry,  lotions, powders colognes, deodorant                          Men may shave face and neck.   Do not bring valuables to the hospital. [place].  Contacts, dentures or bridgework may not be worn into surgery.  Leave suitcase in the car. After surgery it may be brought to your room.                Please read over the following fact sheets you were given:INCENTIVE SPIROMETER  _____________________________________________________________________             [facility] - Preparing for Surgery Before surgery, you can play an important role.  Because skin is not sterile, your skin needs to be as free of germs as possible.  You can reduce the number of germs on your skin by washing with CHG (chlorahexidine gluconate) soap before surgery.  CHG is an antiseptic cleaner which kills germs and bonds with the skin to continue killing germs even after washing. Please DO NOT use if you have an allergy to CHG or antibacterial soaps.  If your skin becomes reddened/irritated stop using the CHG and inform your nurse when you arrive at Short Stay. Do not shave (including  legs and underarms) for at least 48 hours prior to the first CHG shower.  You may shave your face/neck. Please follow these instructions carefully:  1.  Shower with CHG Soap the night before surgery and the  morning of Surgery.  2.  If you choose to wash your hair, wash your hair first as usual with your  normal  shampoo.  3.  After you shampoo, rinse your hair and body thoroughly to remove the  shampoo.                           4.  Use CHG as you would any other liquid soap.  You can apply chg directly  to the skin and wash  Gently with a scrungie or clean washcloth.  5.  Apply the CHG Soap to your body ONLY FROM THE NECK DOWN.   Do not use on face/ open                           Wound or open sores. Avoid contact with eyes, ears mouth and genitals (private parts).                       Wash face,  Genitals (private parts) with your normal soap.             6.  Wash thoroughly, paying special attention to the area where your surgery  will be performed.  7.  Thoroughly rinse your body with warm water from the neck down.  8.  DO NOT shower/wash with your normal soap after using and rinsing off  the CHG Soap.                9.  Pat yourself dry with a clean towel.            10.  Wear clean pajamas.            11.  Place clean sheets on your bed the night of your first shower and do not  sleep with pets. Day of Surgery : Do not apply any lotions/deodorants the morning of surgery.  Please wear clean clothes to the hospital/surgery center.  FAILURE TO FOLLOW THESE INSTRUCTIONS MAY RESULT IN THE CANCELLATION OF YOUR SURGERY PATIENT SIGNATURE_________________________________  NURSE SIGNATURE__________________________________  ________________________________________________________________________   Adam Phenix  An incentive spirometer is a tool that can help keep your lungs clear and active. This tool measures how well you are filling your lungs with each breath.  Taking long deep breaths may help reverse or decrease the chance of developing breathing (pulmonary) problems (especially infection) following:  A long period of time when you are unable to move or be active. BEFORE THE PROCEDURE   If the spirometer includes an indicator to show your best effort, your nurse or respiratory therapist will set it to a desired goal.  If possible, sit up straight or lean slightly forward. Try not to slouch.  Hold the incentive spirometer in an upright position. INSTRUCTIONS FOR USE  1. Sit on the edge of your bed if possible, or sit up as far as you can in bed or on a chair. 2. Hold the incentive spirometer in an upright position. 3. Breathe out normally. 4. Place the mouthpiece in your mouth and seal your lips tightly around it. 5. Breathe in slowly and as deeply as possible, raising the piston or the ball toward the top of the column. 6. Hold your breath for 3-5 seconds or for as long as possible. Allow the piston or ball to fall to the bottom of the column. 7. Remove the mouthpiece from your mouth and breathe out normally. 8. Rest for a few seconds and repeat Steps 1 through 7 at least 10 times every 1-2 hours when you are awake. Take your time and take a few normal breaths between deep breaths. 9. The spirometer may include an indicator to show your best effort. Use the indicator as a goal to work toward during each repetition. 10. After each set of 10 deep breaths, practice coughing to be sure your lungs are clear. If you have an incision (the cut made at the time of surgery),  support your incision when coughing by placing a pillow or rolled up towels firmly against it. Once you are able to get out of bed, walk around indoors and cough well. You may stop using the incentive spirometer when instructed by your caregiver.  RISKS AND COMPLICATIONS  Take your time so you do not get dizzy or light-headed.  If you are in pain, you may need to take or ask for pain  medication before doing incentive spirometry. It is harder to take a deep breath if you are having pain. AFTER USE  Rest and breathe slowly and easily.  It can be helpful to keep track of a log of your progress. Your caregiver can provide you with a simple table to help with this. If you are using the spirometer at home, follow these instructions: [place] IF:   You are having difficultly using the spirometer.  You have trouble using the spirometer as often as instructed.  Your pain medication is not giving enough relief while using the spirometer.  You develop fever of 100.5 F (38.1 C) or higher. SEEK IMMEDIATE MEDICAL CARE IF:   You cough up bloody sputum that had not been present before.  You develop fever of 102 F (38.9 C) or greater.  You develop worsening pain at or near the incision site. MAKE SURE YOU:   Understand these instructions.  Will watch your condition.  Will get help right away if you are not doing well or get worse. Document Released: 05/25/2006 Document Revised: 04/06/2011 Document Reviewed: 07/26/2006 [facility] Patient Information 2014 [place], [place].   ________________________________________________________________________

## 2015-01-01 NOTE — Anesthesia Preprocedure Evaluation (Addendum)
Anesthesia Evaluation  Patient identified by MRN, date of birth, ID band Patient awake    Reviewed: Allergy & Precautions, NPO status , Patient's Chart, lab work & pertinent test results  Airway Mallampati: II   Neck ROM: Full    Dental  (+) Dental Advisory Given, Teeth Intact   Pulmonary former smoker,    breath sounds clear to auscultation       Cardiovascular hypertension, Pt. on medications + angina + dysrhythmias Atrial Fibrillation  Rhythm:Regular  ECHO 01/2014 EF 60%, STENT RCA 2014, AF rate controlled, followed by cardiology, Eliquis   Neuro/Psych Anxiety Depression    GI/Hepatic GERD  Medicated,  Endo/Other    Renal/GU      Musculoskeletal  (+) Arthritis ,   Abdominal (+)  Abdomen: soft.    Peds  Hematology   Anesthesia Other Findings   Reproductive/Obstetrics                            Anesthesia Physical Anesthesia Plan  ASA: III  Anesthesia Plan: General and Spinal   Post-op Pain Management:    Induction:   Airway Management Planned: Oral ETT and Nasal Cannula  Additional Equipment:   Intra-op Plan:   Post-operative Plan:   Informed Consent: I have reviewed the patients History and Physical, chart, labs and discussed the procedure including the risks, benefits and alternatives for the proposed anesthesia with the patient or authorized representative who has indicated his/her understanding and acceptance.     Plan Discussed with:   Anesthesia Plan Comments: (IF Eliquis has been held will offer spinal, if not GA)        Anesthesia Quick Evaluation

## 2015-01-03 ENCOUNTER — Inpatient Hospital Stay (HOSPITAL_COMMUNITY)
Admission: RE | Disposition: A | Discharge status: Home Health | Payer: Medicare Other | Source: Ambulatory Visit | Attending: Specialist

## 2015-01-03 ENCOUNTER — Encounter (HOSPITAL_COMMUNITY): Payer: Self-pay | Admitting: *Deleted

## 2015-01-03 ENCOUNTER — Inpatient Hospital Stay (HOSPITAL_COMMUNITY)
Admission: RE | Admit: 2015-01-03 | Discharge: 2015-01-05 | DRG: 470 | Disposition: A | Discharge status: Home Health | Payer: Medicare Other | Source: Ambulatory Visit | Attending: Specialist | Admitting: Specialist

## 2015-01-03 ENCOUNTER — Inpatient Hospital Stay (HOSPITAL_COMMUNITY): Payer: Medicare Other | Admitting: Anesthesiology

## 2015-01-03 DIAGNOSIS — Z87891 Personal history of nicotine dependence: Secondary | ICD-10-CM | POA: Diagnosis not present

## 2015-01-03 DIAGNOSIS — Z7901 Long term (current) use of anticoagulants: Secondary | ICD-10-CM

## 2015-01-03 DIAGNOSIS — Z955 Presence of coronary angioplasty implant and graft: Secondary | ICD-10-CM

## 2015-01-03 DIAGNOSIS — E785 Hyperlipidemia, unspecified: Secondary | ICD-10-CM | POA: Diagnosis not present

## 2015-01-03 DIAGNOSIS — K219 Gastro-esophageal reflux disease without esophagitis: Secondary | ICD-10-CM | POA: Diagnosis present

## 2015-01-03 DIAGNOSIS — E669 Obesity, unspecified: Secondary | ICD-10-CM | POA: Diagnosis not present

## 2015-01-03 DIAGNOSIS — Z6832 Body mass index (BMI) 32.0-32.9, adult: Secondary | ICD-10-CM

## 2015-01-03 DIAGNOSIS — Z01812 Encounter for preprocedural laboratory examination: Secondary | ICD-10-CM

## 2015-01-03 DIAGNOSIS — M1712 Unilateral primary osteoarthritis, left knee: Principal | ICD-10-CM

## 2015-01-03 DIAGNOSIS — I10 Essential (primary) hypertension: Secondary | ICD-10-CM | POA: Diagnosis not present

## 2015-01-03 DIAGNOSIS — Z96659 Presence of unspecified artificial knee joint: Secondary | ICD-10-CM

## 2015-01-03 DIAGNOSIS — I2510 Atherosclerotic heart disease of native coronary artery without angina pectoris: Secondary | ICD-10-CM | POA: Diagnosis present

## 2015-01-03 DIAGNOSIS — I4891 Unspecified atrial fibrillation: Secondary | ICD-10-CM | POA: Diagnosis not present

## 2015-01-03 DIAGNOSIS — M25562 Pain in left knee: Secondary | ICD-10-CM | POA: Diagnosis present

## 2015-01-03 DIAGNOSIS — M179 Osteoarthritis of knee, unspecified: Secondary | ICD-10-CM | POA: Diagnosis not present

## 2015-01-03 HISTORY — PX: TOTAL KNEE ARTHROPLASTY: SHX125

## 2015-01-03 LAB — TYPE AND SCREEN
ABO/RH(D): A POS
Antibody Screen: NEGATIVE

## 2015-01-03 LAB — GLUCOSE, CAPILLARY: GLUCOSE-CAPILLARY: 96 mg/dL (ref 65–99)

## 2015-01-03 SURGERY — ARTHROPLASTY, KNEE, TOTAL
Anesthesia: General | Site: Knee | Laterality: Left

## 2015-01-03 MED ORDER — HYDROMORPHONE HCL 1 MG/ML IJ SOLN
1.0000 mg | INTRAMUSCULAR | Status: DC | PRN
  Administered 2015-01-05: 1 mg via INTRAVENOUS
  Filled 2015-01-03: qty 1

## 2015-01-03 MED ORDER — ROCURONIUM BROMIDE 100 MG/10ML IV SOLN
INTRAVENOUS | Status: AC
  Filled 2015-01-03: qty 1

## 2015-01-03 MED ORDER — POVIDONE-IODINE 7.5 % EX SOLN: Freq: Once | CUTANEOUS | Status: DC

## 2015-01-03 MED ORDER — POLYETHYLENE GLYCOL 3350 17 G PO PACK: 17.0000 g | PACK | Freq: Every day | ORAL | Status: DC | PRN

## 2015-01-03 MED ORDER — ENOXAPARIN SODIUM 30 MG/0.3ML ~~LOC~~ SOLN
30.0000 mg | Freq: Two times a day (BID) | SUBCUTANEOUS | Status: DC
  Administered 2015-01-04 – 2015-01-05 (×3): 30 mg via SUBCUTANEOUS
  Filled 2015-01-03 (×5): qty 0.3

## 2015-01-03 MED ORDER — PROPOFOL 10 MG/ML IV BOLUS
INTRAVENOUS | Status: AC
  Filled 2015-01-03: qty 40

## 2015-01-03 MED ORDER — PHENOL 1.4 % MT LIQD: 1.0000 | OROMUCOSAL | Status: DC | PRN

## 2015-01-03 MED ORDER — LIDOCAINE HCL (CARDIAC) 20 MG/ML IV SOLN
INTRAVENOUS | Status: AC
  Filled 2015-01-03: qty 5

## 2015-01-03 MED ORDER — MAGNESIUM CITRATE PO SOLN: 1.0000 | Freq: Once | ORAL | Status: DC | PRN

## 2015-01-03 MED ORDER — DOCUSATE SODIUM 100 MG PO CAPS
100.0000 mg | ORAL_CAPSULE | Freq: Two times a day (BID) | ORAL | Status: DC
  Administered 2015-01-03 – 2015-01-05 (×5): 100 mg via ORAL

## 2015-01-03 MED ORDER — PROPOFOL 500 MG/50ML IV EMUL
INTRAVENOUS | Status: DC | PRN
  Administered 2015-01-03: 75 ug/kg/min via INTRAVENOUS

## 2015-01-03 MED ORDER — RANOLAZINE ER 500 MG PO TB12
500.0000 mg | ORAL_TABLET | Freq: Two times a day (BID) | ORAL | Status: DC
  Administered 2015-01-03 – 2015-01-05 (×4): 500 mg via ORAL
  Filled 2015-01-03 (×5): qty 1

## 2015-01-03 MED ORDER — ACETAMINOPHEN 325 MG PO TABS
650.0000 mg | ORAL_TABLET | Freq: Four times a day (QID) | ORAL | Status: DC | PRN
  Filled 2015-01-03: qty 2

## 2015-01-03 MED ORDER — METOCLOPRAMIDE HCL 5 MG/ML IJ SOLN: 5.0000 mg | Freq: Three times a day (TID) | INTRAMUSCULAR | Status: DC | PRN

## 2015-01-03 MED ORDER — DEXAMETHASONE SODIUM PHOSPHATE 10 MG/ML IJ SOLN
INTRAMUSCULAR | Status: AC
  Filled 2015-01-03: qty 1

## 2015-01-03 MED ORDER — KETOROLAC TROMETHAMINE 30 MG/ML IJ SOLN
INTRAMUSCULAR | Status: DC | PRN
  Administered 2015-01-03: 30 mg

## 2015-01-03 MED ORDER — FENTANYL CITRATE (PF) 100 MCG/2ML IJ SOLN
INTRAMUSCULAR | Status: AC
  Filled 2015-01-03: qty 4

## 2015-01-03 MED ORDER — SODIUM CHLORIDE 0.9 % IR SOLN
Status: DC | PRN
  Administered 2015-01-03: 1000 mL

## 2015-01-03 MED ORDER — ONDANSETRON HCL 4 MG/2ML IJ SOLN
INTRAMUSCULAR | Status: AC
  Filled 2015-01-03: qty 2

## 2015-01-03 MED ORDER — BUPIVACAINE-EPINEPHRINE (PF) 0.25% -1:200000 IJ SOLN
INTRAMUSCULAR | Status: AC
  Filled 2015-01-03: qty 30

## 2015-01-03 MED ORDER — SERTRALINE HCL 100 MG PO TABS
100.0000 mg | ORAL_TABLET | Freq: Every day | ORAL | Status: DC
  Administered 2015-01-04 – 2015-01-05 (×2): 100 mg via ORAL
  Filled 2015-01-03 (×2): qty 1

## 2015-01-03 MED ORDER — PHENYLEPHRINE 40 MCG/ML (10ML) SYRINGE FOR IV PUSH (FOR BLOOD PRESSURE SUPPORT)
PREFILLED_SYRINGE | INTRAVENOUS | Status: AC
  Filled 2015-01-03: qty 10

## 2015-01-03 MED ORDER — DEXTROSE 5 % IV SOLN
20.0000 mg | INTRAVENOUS | Status: DC | PRN
  Administered 2015-01-03: 25 ug/min via INTRAVENOUS

## 2015-01-03 MED ORDER — FERROUS SULFATE 325 (65 FE) MG PO TABS
325.0000 mg | ORAL_TABLET | Freq: Three times a day (TID) | ORAL | Status: DC
  Administered 2015-01-03 – 2015-01-04 (×3): 325 mg via ORAL
  Filled 2015-01-03 (×8): qty 1

## 2015-01-03 MED ORDER — MENTHOL 3 MG MT LOZG: 1.0000 | LOZENGE | OROMUCOSAL | Status: DC | PRN

## 2015-01-03 MED ORDER — ONDANSETRON HCL 4 MG/2ML IJ SOLN
INTRAMUSCULAR | Status: DC | PRN
  Administered 2015-01-03: 4 mg via INTRAVENOUS

## 2015-01-03 MED ORDER — PROMETHAZINE HCL 25 MG/ML IJ SOLN: 6.2500 mg | INTRAMUSCULAR | Status: DC | PRN

## 2015-01-03 MED ORDER — BUPIVACAINE IN DEXTROSE 0.75-8.25 % IT SOLN
INTRATHECAL | Status: DC | PRN
  Administered 2015-01-03: 1.8 mL via INTRATHECAL

## 2015-01-03 MED ORDER — CEFAZOLIN SODIUM-DEXTROSE 2-3 GM-% IV SOLR
2.0000 g | Freq: Four times a day (QID) | INTRAVENOUS | Status: AC
  Administered 2015-01-03 (×2): 2 g via INTRAVENOUS
  Filled 2015-01-03 (×2): qty 50

## 2015-01-03 MED ORDER — ONDANSETRON HCL 4 MG/2ML IJ SOLN: 4.0000 mg | Freq: Four times a day (QID) | INTRAMUSCULAR | Status: DC | PRN

## 2015-01-03 MED ORDER — ATENOLOL 50 MG PO TABS
50.0000 mg | ORAL_TABLET | Freq: Every day | ORAL | Status: DC
Start: 2015-01-04 — End: 2015-01-05
  Administered 2015-01-04 – 2015-01-05 (×2): 50 mg via ORAL
  Filled 2015-01-03 (×2): qty 1

## 2015-01-03 MED ORDER — METHOCARBAMOL 500 MG PO TABS
500.0000 mg | ORAL_TABLET | Freq: Four times a day (QID) | ORAL | Status: DC | PRN
  Administered 2015-01-03 – 2015-01-05 (×5): 500 mg via ORAL
  Filled 2015-01-03 (×5): qty 1

## 2015-01-03 MED ORDER — ACETAMINOPHEN 650 MG RE SUPP: 650.0000 mg | Freq: Four times a day (QID) | RECTAL | Status: DC | PRN

## 2015-01-03 MED ORDER — BISACODYL 5 MG PO TBEC: 5.0000 mg | DELAYED_RELEASE_TABLET | Freq: Every day | ORAL | Status: DC | PRN

## 2015-01-03 MED ORDER — ALUM & MAG HYDROXIDE-SIMETH 200-200-20 MG/5ML PO SUSP: 30.0000 mL | ORAL | Status: DC | PRN

## 2015-01-03 MED ORDER — POTASSIUM CHLORIDE IN NACL 20-0.9 MEQ/L-% IV SOLN
INTRAVENOUS | Status: DC
  Administered 2015-01-03: 12:00:00 via INTRAVENOUS
  Filled 2015-01-03 (×4): qty 1000

## 2015-01-03 MED ORDER — APIXABAN 5 MG PO TABS: 5.0000 mg | ORAL_TABLET | Freq: Two times a day (BID) | ORAL | Status: DC

## 2015-01-03 MED ORDER — KETAMINE HCL 10 MG/ML IJ SOLN
INTRAMUSCULAR | Status: AC
  Filled 2015-01-03: qty 2

## 2015-01-03 MED ORDER — ONDANSETRON HCL 4 MG PO TABS: 4.0000 mg | ORAL_TABLET | Freq: Four times a day (QID) | ORAL | Status: DC | PRN

## 2015-01-03 MED ORDER — MIDAZOLAM HCL 2 MG/2ML IJ SOLN
INTRAMUSCULAR | Status: AC
  Filled 2015-01-03: qty 2

## 2015-01-03 MED ORDER — ATENOLOL 25 MG PO TABS
25.0000 mg | ORAL_TABLET | Freq: Every day | ORAL | Status: DC
Start: 2015-01-03 — End: 2015-01-05
  Administered 2015-01-04: 25 mg via ORAL
  Filled 2015-01-03 (×3): qty 1

## 2015-01-03 MED ORDER — LACTATED RINGERS IV SOLN
INTRAVENOUS | Status: DC | PRN
  Administered 2015-01-03 (×2): via INTRAVENOUS

## 2015-01-03 MED ORDER — BUPIVACAINE-EPINEPHRINE 0.25% -1:200000 IJ SOLN
INTRAMUSCULAR | Status: DC | PRN
  Administered 2015-01-03: 30 mL

## 2015-01-03 MED ORDER — FENTANYL CITRATE (PF) 100 MCG/2ML IJ SOLN: 25.0000 ug | INTRAMUSCULAR | Status: DC | PRN

## 2015-01-03 MED ORDER — DEXAMETHASONE SODIUM PHOSPHATE 10 MG/ML IJ SOLN
10.0000 mg | Freq: Once | INTRAMUSCULAR | Status: AC
  Administered 2015-01-04: 10 mg via INTRAVENOUS
  Filled 2015-01-03: qty 1

## 2015-01-03 MED ORDER — DEXAMETHASONE SODIUM PHOSPHATE 10 MG/ML IJ SOLN
10.0000 mg | Freq: Once | INTRAMUSCULAR | Status: AC
  Administered 2015-01-03: 10 mg via INTRAVENOUS

## 2015-01-03 MED ORDER — ZOLPIDEM TARTRATE 5 MG PO TABS: 5.0000 mg | ORAL_TABLET | Freq: Every evening | ORAL | Status: DC | PRN

## 2015-01-03 MED ORDER — FENTANYL CITRATE (PF) 100 MCG/2ML IJ SOLN
INTRAMUSCULAR | Status: DC | PRN
  Administered 2015-01-03: 50 ug via INTRAVENOUS

## 2015-01-03 MED ORDER — DIPHENHYDRAMINE HCL 12.5 MG/5ML PO ELIX: 12.5000 mg | ORAL_SOLUTION | ORAL | Status: DC | PRN

## 2015-01-03 MED ORDER — METOCLOPRAMIDE HCL 10 MG PO TABS: 5.0000 mg | ORAL_TABLET | Freq: Three times a day (TID) | ORAL | Status: DC | PRN

## 2015-01-03 MED ORDER — OXYCODONE HCL 5 MG PO TABS
5.0000 mg | ORAL_TABLET | ORAL | Status: DC | PRN
  Administered 2015-01-03 – 2015-01-05 (×10): 10 mg via ORAL
  Filled 2015-01-03 (×10): qty 2

## 2015-01-03 MED ORDER — SODIUM CHLORIDE 0.9 % IJ SOLN
INTRAMUSCULAR | Status: DC | PRN
  Administered 2015-01-03: 30 mL

## 2015-01-03 MED ORDER — KETOROLAC TROMETHAMINE 30 MG/ML IJ SOLN
INTRAMUSCULAR | Status: AC
  Filled 2015-01-03: qty 1

## 2015-01-03 MED ORDER — ATORVASTATIN CALCIUM 40 MG PO TABS
40.0000 mg | ORAL_TABLET | Freq: Every day | ORAL | Status: DC
  Administered 2015-01-03 – 2015-01-04 (×2): 40 mg via ORAL
  Filled 2015-01-03 (×3): qty 1

## 2015-01-03 MED ORDER — CEFAZOLIN SODIUM-DEXTROSE 2-3 GM-% IV SOLR
INTRAVENOUS | Status: AC
  Filled 2015-01-03: qty 50

## 2015-01-03 MED ORDER — SODIUM CHLORIDE 0.9 % IJ SOLN
INTRAMUSCULAR | Status: AC
  Filled 2015-01-03: qty 50

## 2015-01-03 MED ORDER — PHENYLEPHRINE HCL 10 MG/ML IJ SOLN
INTRAMUSCULAR | Status: DC | PRN
  Administered 2015-01-03 (×2): 80 ug via INTRAVENOUS
  Administered 2015-01-03: 160 ug via INTRAVENOUS
  Administered 2015-01-03 (×5): 80 ug via INTRAVENOUS

## 2015-01-03 MED ORDER — MIDAZOLAM HCL 2 MG/2ML IJ SOLN
INTRAMUSCULAR | Status: DC | PRN
  Administered 2015-01-03: 2 mg via INTRAVENOUS

## 2015-01-03 MED ORDER — CEFAZOLIN SODIUM-DEXTROSE 2-3 GM-% IV SOLR
2.0000 g | INTRAVENOUS | Status: AC
  Administered 2015-01-03: 2 g via INTRAVENOUS

## 2015-01-03 MED ORDER — METHOCARBAMOL 1000 MG/10ML IJ SOLN
500.0000 mg | Freq: Four times a day (QID) | INTRAMUSCULAR | Status: DC | PRN
  Filled 2015-01-03: qty 5

## 2015-01-03 MED ORDER — EPHEDRINE SULFATE 50 MG/ML IJ SOLN
INTRAMUSCULAR | Status: AC
Start: 2015-01-03 — End: 2015-01-03
  Filled 2015-01-03: qty 1

## 2015-01-03 MED ORDER — MEPERIDINE HCL 50 MG/ML IJ SOLN: 6.2500 mg | INTRAMUSCULAR | Status: DC | PRN

## 2015-01-03 SURGICAL SUPPLY — 61 items
BAG DECANTER FOR FLEXI CONT (MISCELLANEOUS) IMPLANT
BAG ZIPLOCK 12X15 (MISCELLANEOUS) ×2 IMPLANT
BANDAGE ELASTIC 4 VELCRO ST LF (GAUZE/BANDAGES/DRESSINGS) ×2 IMPLANT
BANDAGE ELASTIC 6 VELCRO ST LF (GAUZE/BANDAGES/DRESSINGS) ×2 IMPLANT
BLADE SAG 18X100X1.27 (BLADE) ×2 IMPLANT
BLADE SAW SGTL 13.0X1.19X90.0M (BLADE) ×2 IMPLANT
CAP KNEE TOTAL 3 SIGMA ×2 IMPLANT
CEMENT HV SMART SET (Cement) ×4 IMPLANT
CLOTH BEACON ORANGE TIMEOUT ST (SAFETY) ×2 IMPLANT
CUFF TOURN SGL QUICK 34 (TOURNIQUET CUFF) ×1
CUFF TRNQT CYL 34X4X40X1 (TOURNIQUET CUFF) ×1 IMPLANT
DECANTER SPIKE VIAL GLASS SM (MISCELLANEOUS) ×2 IMPLANT
DRAPE U-SHAPE 47X51 STRL (DRAPES) ×2 IMPLANT
DRSG AQUACEL AG ADV 3.5X10 (GAUZE/BANDAGES/DRESSINGS) ×2 IMPLANT
DRSG TEGADERM 4X4.75 (GAUZE/BANDAGES/DRESSINGS) ×2 IMPLANT
DURAPREP 26ML APPLICATOR (WOUND CARE) ×4 IMPLANT
ELECT REM PT RETURN 9FT ADLT (ELECTROSURGICAL) ×2
ELECTRODE REM PT RTRN 9FT ADLT (ELECTROSURGICAL) ×1 IMPLANT
EVACUATOR 1/8 PVC DRAIN (DRAIN) ×2 IMPLANT
GAUZE SPONGE 2X2 8PLY STRL LF (GAUZE/BANDAGES/DRESSINGS) ×1 IMPLANT
GLOVE BIOGEL PI IND STRL 6.5 (GLOVE) ×1 IMPLANT
GLOVE BIOGEL PI IND STRL 7.5 (GLOVE) ×1 IMPLANT
GLOVE BIOGEL PI IND STRL 8 (GLOVE) ×2 IMPLANT
GLOVE BIOGEL PI INDICATOR 6.5 (GLOVE) ×1
GLOVE BIOGEL PI INDICATOR 7.5 (GLOVE) ×1
GLOVE BIOGEL PI INDICATOR 8 (GLOVE) ×2
GLOVE SURG ORTHO 8.0 STRL STRW (GLOVE) ×2 IMPLANT
GLOVE SURG ORTHO 9.0 STRL STRW (GLOVE) ×2 IMPLANT
GLOVE SURG SS PI 6.5 STRL IVOR (GLOVE) ×2 IMPLANT
GLOVE SURG SS PI 7.5 STRL IVOR (GLOVE) ×4 IMPLANT
GOWN STRL REUS W/TWL XL LVL3 (GOWN DISPOSABLE) ×8 IMPLANT
HANDPIECE INTERPULSE COAX TIP (DISPOSABLE) ×1
IMMOBILIZER KNEE 20 (SOFTGOODS) ×2
IMMOBILIZER KNEE 20 THIGH 36 (SOFTGOODS) ×1 IMPLANT
NS IRRIG 1000ML POUR BTL (IV SOLUTION) ×2 IMPLANT
PACK TOTAL KNEE CUSTOM (KITS) ×2 IMPLANT
POSITIONER SURGICAL ARM (MISCELLANEOUS) ×2 IMPLANT
SET HNDPC FAN SPRY TIP SCT (DISPOSABLE) ×1 IMPLANT
SET PAD KNEE POSITIONER (MISCELLANEOUS) ×2 IMPLANT
SPONGE GAUZE 2X2 STER 10/PKG (GAUZE/BANDAGES/DRESSINGS) ×1
SPONGE LAP 18X18 X RAY DECT (DISPOSABLE) IMPLANT
SPONGE SURGIFOAM ABS GEL 100 (HEMOSTASIS) ×2 IMPLANT
STOCKINETTE 6  STRL (DRAPES) ×1
STOCKINETTE 6 STRL (DRAPES) ×1 IMPLANT
SUCTION FRAZIER 12FR DISP (SUCTIONS) IMPLANT
SUT BONE WAX W31G (SUTURE) IMPLANT
SUT MNCRL AB 3-0 PS2 18 (SUTURE) ×2 IMPLANT
SUT VIC AB 1 CT1 27 (SUTURE) ×4
SUT VIC AB 1 CT1 27XBRD ANTBC (SUTURE) ×4 IMPLANT
SUT VIC AB 2-0 CT1 27 (SUTURE) ×2
SUT VIC AB 2-0 CT1 TAPERPNT 27 (SUTURE) ×2 IMPLANT
SUT VLOC 180 0 24IN GS25 (SUTURE) ×2 IMPLANT
SYR 50ML LL SCALE MARK (SYRINGE) ×2 IMPLANT
TAPE STRIPS DRAPE STRL (GAUZE/BANDAGES/DRESSINGS) ×2 IMPLANT
TOWER CARTRIDGE SMART MIX (DISPOSABLE) ×2 IMPLANT
TRAY FOLEY W/METER SILVER 14FR (SET/KITS/TRAYS/PACK) IMPLANT
TRAY FOLEY W/METER SILVER 16FR (SET/KITS/TRAYS/PACK) ×2 IMPLANT
WATER STERILE IRR 1000ML POUR (IV SOLUTION) ×2 IMPLANT
WATER STERILE IRR 1500ML POUR (IV SOLUTION) IMPLANT
WRAP KNEE MAXI GEL POST OP (GAUZE/BANDAGES/DRESSINGS) ×2 IMPLANT
YANKAUER SUCT BULB TIP 10FT TU (MISCELLANEOUS) ×2 IMPLANT

## 2015-01-03 NOTE — Anesthesia Procedure Notes (Signed)
Spinal Patient location during procedure: OR Start time: 01/03/2015 7:48 AM End time: 01/03/2015 8:01 AM Staffing Anesthesiologist: Alexis Frock Preanesthetic Checklist Completed: patient identified, site marked, surgical consent, pre-op evaluation, timeout performed, IV checked, risks and benefits discussed and monitors and equipment checked Spinal Block Patient position: sitting Prep: site prepped and draped and DuraPrep Patient monitoring: heart rate, continuous pulse ox and blood pressure Approach: midline Location: L4-5 Injection technique: single-shot Needle Needle type: Introducer and Pencil-Tip  Needle gauge: 24 G Needle length: 10 cm Needle insertion depth: 7 cm Assessment Sensory level: T6 Additional Notes Negative paresthesia. Negative blood return. Positive free-flowing CSF. Expiration date of kit checked and confirmed. Patient tolerated procedure well, without complications.

## 2015-01-03 NOTE — Progress Notes (Signed)
Utilization review completed.  

## 2015-01-03 NOTE — Evaluation (Signed)
Physical Therapy Evaluation Patient Details Name: Eddie Price MRN: [id number] DOB: [birth year] Today's Date: 01/03/2015   History of Present Illness  74 yo male s/p L TKA  Clinical Impression  Pt admitted with above diagnosis. Pt currently with functional limitations due to the deficits listed below (see PT Problem List). Pt will benefit from skilled PT to increase their independence and safety with mobility to allow discharge to the venue listed below. Pt did well ambulating with physical therapy today POD # 0. Pt is motivated to return to independent level of function, he has support of his wife at home and has all necessary equipment from previous hip replacement surgery. Recommend HHPT for continuous rehab.       Follow Up Recommendations Home health PT;Supervision for mobility/OOB    Equipment Recommendations  None recommended by PT    Recommendations for Other Services       Precautions / Restrictions Precautions Precautions: Knee Required Braces or Orthoses: Knee Immobilizer - Left Knee Immobilizer - Left: Discontinue once straight leg raise with < 10 degree lag Restrictions Weight Bearing Restrictions: No LLE Weight Bearing: Weight bearing as tolerated      Mobility  Bed Mobility Overal bed mobility: Needs Assistance Bed Mobility: Supine to Sit     Supine to sit: Min assist     General bed mobility comments: mod VC's for hand placement and use of UEs to self-assist with trunk, min assist with LLE   Transfers Overall transfer level: Needs assistance Equipment used: Rolling walker (2 wheeled) Transfers: Sit to/from Stand Sit to Stand: Min assist         General transfer comment: min assist for steadying, cues for LLE extension; pt denies dizziness when standing   Ambulation/Gait Ambulation/Gait assistance: Min guard Ambulation Distance (Feet): 80 Feet Assistive device: Rolling walker (2 wheeled) Gait Pattern/deviations: Step-through pattern;Decreased  weight shift to left;Antalgic     General Gait Details: min cues for sequencing and to employ step to pattern, however, pt continues step through; pt is highly motivated to get better and doesn't seem to be in much pain during ambulation  Stairs            Wheelchair Mobility    Modified Rankin (Stroke Patients Only)       Balance                                             Pertinent Vitals/Pain Pain Assessment: 0-10 Pain Score: 3  Pain Location: L knee Pain Descriptors / Indicators: Aching;Sore Pain Intervention(s): Monitored during session;Premedicated before session;Repositioned;Limited activity within patient's tolerance    Home Living Family/patient expects to be discharged to:: Private residence Living Arrangements: Spouse/significant other Available Help at Discharge: Family Type of Home: House Home Access: Stairs to enter Entrance Stairs-Rails: Psychiatric nurse of Steps: 4 Home Layout: One level Home Equipment: Environmental consultant - 2 wheels;Crutches;Bedside commode      Prior Function Level of Independence: Independent               Hand Dominance        Extremity/Trunk Assessment   Upper Extremity Assessment: Overall WFL for tasks assessed           Lower Extremity Assessment: LLE deficits/detail   LLE Deficits / Details: able to perform SLR, ROM TBA  Cervical / Trunk Assessment: Normal  Communication   Communication: No  difficulties  Cognition Arousal/Alertness: Awake/alert Behavior During Therapy: WFL for tasks assessed/performed Overall Cognitive Status: Within Functional Limits for tasks assessed                      General Comments      Exercises Total Joint Exercises Ankle Circles/Pumps: AROM;Both;15 reps (educated pt on performing ankle pumps throughout the day )      Assessment/Plan    PT Assessment Patient needs continued PT services  PT Diagnosis Difficulty walking;Abnormality  of gait;Acute pain   PT Problem List Decreased strength;Decreased range of motion;Decreased activity tolerance;Decreased mobility;Decreased knowledge of use of DME;Decreased safety awareness;Decreased knowledge of precautions;Pain  PT Treatment Interventions DME instruction;Gait training;Stair training;Functional mobility training;Therapeutic activities;Therapeutic exercise;Patient/family education   PT Goals (Current goals can be found in the Care Plan section) Acute Rehab PT Goals Patient Stated Goal: to walk without pain PT Goal Formulation: With patient/family Time For Goal Achievement: 01/17/15 Potential to Achieve Goals: Good    Frequency 7X/week   Barriers to discharge        Co-evaluation               End of Session Equipment Utilized During Treatment: Gait belt Activity Tolerance: Patient tolerated treatment well Patient left: in chair;with call bell/phone within reach;with family/visitor present           Time: [id number] PT Time Calculation (min) (ACUTE ONLY): 21 min   Charges:   PT Evaluation $Initial PT Evaluation Tier I: 1 Procedure     PT G Codes:        Casten Floren, SPT 2015/01/25, 3:59 PM

## 2015-01-03 NOTE — Transfer of Care (Signed)
Immediate Anesthesia Transfer of Care Note  Patient: Eddie Price  Procedure(s) Performed: Procedure(s): TOTAL KNEE ARTHROPLASTY (Left)  Patient Location: PACU  Anesthesia Type:Spinal  Level of Consciousness: alert  and patient cooperative  Airway & Oxygen Therapy: Patient connected to face mask oxygen  Post-op Assessment: Post -op Vital signs reviewed and stable  Post vital signs: stable  Last Vitals:  Filed Vitals:   01/03/15 0603  BP: 120/78  Pulse: 72  Temp: 36.6 C  Resp: 18    Complications: No apparent anesthesia complications

## 2015-01-03 NOTE — Progress Notes (Signed)
CSW consulted for SNF placement. PN reviewed. PT is recommending HHPT at d/c. RNCM will assist with d/c planning needs.  [name] LCSW [phone] 

## 2015-01-03 NOTE — Interval H&P Note (Signed)
History and Physical Interval Note:  01/03/2015 7:38 AM  Eddie Price  has presented today for surgery, with the diagnosis of LEFT KNEE OA  The various methods of treatment have been discussed with the patient and family. After consideration of risks, benefits and other options for treatment, the patient has consented to  Procedure(s): TOTAL KNEE ARTHROPLASTY (Left) as a surgical intervention .  The patient's history has been reviewed, patient examined, no change in status, stable for surgery.  I have reviewed the patient's chart and labs.  Questions were answered to the patient's satisfaction.     [name] ANDREW

## 2015-01-03 NOTE — Op Note (Signed)
DATE OF SURGERY:  01/03/2015  TIME: 9:43 AM  PATIENT NAME:  Eddie Price    AGE: 74 y.o.   PRE-OPERATIVE DIAGNOSIS:  LEFT KNEE OA  POST-OPERATIVE DIAGNOSIS:  LEFT KNEE OA  PROCEDURE:  Procedure(s): TOTAL KNEE ARTHROPLASTY  SURGEON:  Hampton Wixom ANDREW  ASSISTANT:  Bryson Stilwell, PA-C, present and scrubbed throughout the case, critical for assistance with exposure, retraction, instrumentation, and closure.  OPERATIVE IMPLANTS: Depuy PFC Sigma Rotating Platform.  Femur size 5, Tibia size 4, Patella size 35 3-peg oval button, with a 10 mm polyethylene insert.   PREOPERATIVE INDICATIONS:   Eddie Price is a 74 y.o. year old male with end stage bone on bone arthritis of the knee who failed conservative treatment and elected for Total Knee Arthroplasty.   The risks, benefits, and alternatives were discussed at length including but not limited to the risks of infection, bleeding, nerve injury, stiffness, blood clots, the need for revision surgery, cardiopulmonary complications, among others, and they were willing to proceed.  OPERATIVE DESCRIPTION:  The patient was brought to the operative room and placed in a supine position.  Spinal anesthesia was administered.  IV antibiotics were given.  The lower extremity was prepped and draped in the usual sterile fashion.  Time out was performed.  The leg was elevated and exsanguinated and the tourniquet was inflated.  Anterior quadriceps tendon splitting approach was performed.  The patella was retracted and osteophytes were removed.  The anterior horn of the medial and lateral meniscus was removed and cruciate ligaments resected.   The distal femur was opened with the drill and the intramedullary distal femoral cutting jig was utilized, set at 5 degrees resecting 10 mm off the distal femur.  Care was taken to protect the collateral ligaments.  The distal femoral sizing jig was applied, taking care to avoid notching.  Then the 4-in-1  cutting jig was applied and the anterior and posterior femur was cut, along with the chamfer cuts.    Then the extramedullary tibial cutting jig was utilized making the appropriate cut using the anterior tibial crest as a reference building in appropriate posterior slope.  Care was taken during the cut to protect the medial and collateral ligaments.  The proximal tibia was removed along with the posterior horns of the menisci.   The posterior medial femoral osteophytes and posterior lateral femoral osteophytes were removed.    The flexion gap was then measured and was symmetric with the extension gap, measured at 10.  I completed the distal femoral preparation using the appropriate jig to prepare the box.  The patella was then measured, and cut with the saw.    The proximal tibia sized and prepared accordingly with the reamer and the punch, and then all components were trialed with the trial insert.  The knee was found to have excellent balance and full motion.    The above named components were then cemented into place and all excess cement was removed.  The trial polyethylene component was in place during cementation, and then was exchanged for the real polyethylene component.    The knee was easily taken through a range of motion and the patella tracked well and the knee irrigated copiously and the parapatellar and subcutaneous tissue closed with vicryl, and monocryl with steri strips for the skin.  The arthrotomy was closed at 90 of flexion. The wounds were dressed with sterile gauze and the tourniquet released and the patient was awakened and returned to the PACU in  stable and satisfactory condition.  There were no complications.  Total tourniquet time was 90 minutes.

## 2015-01-03 NOTE — Anesthesia Postprocedure Evaluation (Signed)
Anesthesia Post Note  Patient: Eddie Price  Procedure(s) Performed: Procedure(s) (LRB): TOTAL KNEE ARTHROPLASTY (Left)  Patient location during evaluation: PACU Anesthesia Type: Spinal Level of consciousness: oriented and awake and alert Pain management: pain level controlled Vital Signs Assessment: post-procedure vital signs reviewed and stable Respiratory status: spontaneous breathing, respiratory function stable and patient connected to nasal cannula oxygen Cardiovascular status: blood pressure returned to baseline and stable Postop Assessment: no headache and no backache Anesthetic complications: no    Last Vitals:  Filed Vitals:   01/03/15 1020 01/03/15 1030  BP:  132/79  Pulse: 77 66  Temp:    Resp: 19 14    Last Pain: There were no vitals filed for this visit.               Cord Wilczynski

## 2015-01-04 LAB — CBC
HCT: 36.8 % — ABNORMAL LOW (ref 39.0–52.0)
HEMOGLOBIN: 12.4 g/dL — AB (ref 13.0–17.0)
MCH: 32.5 pg (ref 26.0–34.0)
MCHC: 33.7 g/dL (ref 30.0–36.0)
MCV: 96.3 fL (ref 78.0–100.0)
PLATELETS: 187 10*3/uL (ref 150–400)
RBC: 3.82 MIL/uL — AB (ref 4.22–5.81)
RDW: 12.9 % (ref 11.5–15.5)
WBC: 12.9 10*3/uL — AB (ref 4.0–10.5)

## 2015-01-04 LAB — BASIC METABOLIC PANEL
ANION GAP: 7 (ref 5–15)
BUN: 15 mg/dL (ref 6–20)
CO2: 23 mmol/L (ref 22–32)
Calcium: 8.4 mg/dL — ABNORMAL LOW (ref 8.9–10.3)
Chloride: 108 mmol/L (ref 101–111)
Creatinine, Ser: 1.12 mg/dL (ref 0.61–1.24)
GFR calc Af Amer: >60 mL/min (ref >60)
GLUCOSE: 176 mg/dL — AB (ref 65–99)
POTASSIUM: 4.7 mmol/L (ref 3.5–5.1)
Sodium: 138 mmol/L (ref 135–145)

## 2015-01-04 MED ORDER — METHOCARBAMOL 500 MG PO TABS: 500.0000 mg | ORAL_TABLET | Freq: Three times a day (TID) | ORAL | Status: DC | PRN

## 2015-01-04 MED ORDER — OXYCODONE-ACETAMINOPHEN 5-325 MG PO TABS
1.0000 | ORAL_TABLET | ORAL | Status: DC | PRN
Start: 2015-01-04 — End: 2015-03-14

## 2015-01-04 NOTE — Care Management Note (Signed)
Case Management Note  Patient Details  Name: Eddie Price MRN: [id number] Date of Birth: 07-28-1940  Subjective/Objective:     S/p Left Total Knee Arthroplasty               Action/Plan: Discharge planning, spoke with patient and spouse at bedside. Have chosen [facility] for [facility] PT. Contacted [facility] for referral. No DME needs, has all needed DME at home.  Expected Discharge Date:                  Expected Discharge Plan:  [place]  In-House Referral:  NA  Discharge planning Services  CM Consult  Post Acute Care Choice:  Home Health Choice offered to:  Patient  DME Arranged:  N/A DME Agency:  NA  HH Arranged:  PT [place] Agency:  [place]  Status of Service:  Completed, signed off  Medicare Important Message Given:    Date Medicare IM Given:    Medicare IM give by:    Date Additional Medicare IM Given:    Additional Medicare Important Message give by:     If discussed at [place] of Stay Meetings, dates discussed:    Additional Comments:  Guadalupe Maple, RN 01/04/2015, 10:19 AM

## 2015-01-04 NOTE — Addendum Note (Signed)
Addendum  created 01/04/15 1008 by Lollie Sails, CRNA   Modules edited: Charges VN

## 2015-01-04 NOTE — Progress Notes (Signed)
Physical Therapy Treatment Patient Details Name: Eddie Price MRN: [id number] DOB: [birth year] Today's Date: 01/04/2015    History of Present Illness 74 yo male s/p L TKA    PT Comments    Pt ambulating well and wished to practice steps today.  Pt also performed LE exercises.  Pt progressing well.  Follow Up Recommendations  Home health PT;Supervision for mobility/OOB     Equipment Recommendations  None recommended by PT    Recommendations for Other Services       Precautions / Restrictions Precautions Precautions: Knee Precaution Comments: able to perform SLR Restrictions LLE Weight Bearing: Weight bearing as tolerated    Mobility  Bed Mobility Overal bed mobility: Needs Assistance Bed Mobility: Supine to Sit     Supine to sit: Supervision     General bed mobility comments: HOB raised  Transfers Overall transfer level: Needs assistance Equipment used: Rolling walker (2 wheeled) Transfers: Sit to/from Stand Sit to Stand: Min guard         General transfer comment: verbal cues for safe technique  Ambulation/Gait Ambulation/Gait assistance: Min guard Ambulation Distance (Feet): 200 Feet Assistive device: Rolling walker (2 wheeled) Gait Pattern/deviations: Step-through pattern;Antalgic;Decreased stance time - right     General Gait Details: verbal cues for shorter steps for better pain control   Stairs Stairs: Yes Stairs assistance: Min guard Stair Management: Step to pattern;Backwards;With walker Number of Stairs: 2 General stair comments: verbal cues for sequence, safety, RW positioning  Wheelchair Mobility    Modified Rankin (Stroke Patients Only)       Balance                                    Cognition Arousal/Alertness: Awake/alert Behavior During Therapy: WFL for tasks assessed/performed Overall Cognitive Status: Within Functional Limits for tasks assessed                      Exercises Total Joint  Exercises Ankle Circles/Pumps: AROM;Both;10 reps Quad Sets: AROM;Both;10 reps Short Arc Quad: AROM;Left;10 reps Heel Slides: AAROM;Seated;Left;10 reps Hip ABduction/ADduction: AROM;Left;10 reps Straight Leg Raises: AROM;Left;10 reps Goniometric ROM: L knee flexion sitting AAROM 95*    General Comments        Pertinent Vitals/Pain Pain Assessment: 0-10 Pain Score: 2  Pain Location: L knee Pain Descriptors / Indicators: Sore Pain Intervention(s): Limited activity within patient's tolerance;Monitored during session;Repositioned;Ice applied    Home Living Family/patient expects to be discharged to:: Private residence Living Arrangements: Spouse/significant other           Home Equipment: Shower seat;Bedside commode;Walker - 2 wheels      Prior Function Level of Independence: Independent          PT Goals (current goals can now be found in the care plan section) Acute Rehab PT Goals Patient Stated Goal: to walk without pain Progress towards PT goals: Progressing toward goals    Frequency  7X/week    PT Plan Current plan remains appropriate    Co-evaluation             End of Session Equipment Utilized During Treatment: Gait belt Activity Tolerance: Patient tolerated treatment well Patient left: in chair;with call bell/phone within reach;with family/visitor present     Time: [id number] PT Time Calculation (min) (ACUTE ONLY): 27 min  Charges:  $Gait Training: 8-22 mins $Therapeutic Exercise: 8-22 mins  G Codes:      Kevonna Nolte,KATHrine E Jan 31, 2015, 12:24 PM Carmelia Bake, PT, DPT January 31, 2015 Pager: [phone]

## 2015-01-04 NOTE — Progress Notes (Signed)
Physical Therapy Treatment Note    01/04/15 1400  PT Visit Information  Last PT Received On 01/04/15  Assistance Needed +1  History of Present Illness 74 yo male s/p L TKA  PT Time Calculation  PT Start Time (ACUTE ONLY) 1410  PT Stop Time (ACUTE ONLY) 1421  PT Time Calculation (min) (ACUTE ONLY) 11 min  Subjective Data  Subjective Pt mobilizing very well and anticipates d/c home tomorrow.  Precautions  Precautions Knee  Precaution Comments able to perform SLR  Restrictions  LLE Weight Bearing WBAT  Pain Assessment  Pain Assessment 0-10  Pain Score 2  Pain Location L knee  Pain Descriptors / Indicators Sore  Pain Intervention(s) Limited activity within patient's tolerance;Monitored during session;Repositioned;Ice applied  Cognition  Arousal/Alertness Awake/alert  Behavior During Therapy WFL for tasks assessed/performed  Overall Cognitive Status Within Functional Limits for tasks assessed  Bed Mobility  Overal bed mobility Needs Assistance  Bed Mobility Supine to Sit;Sit to Supine  Supine to sit Supervision  Sit to supine Supervision  Transfers  Overall transfer level Needs assistance  Equipment used Rolling walker (2 wheeled)  Transfers Sit to/from Stand  Sit to Stand Supervision  General transfer comment no cues this session  Ambulation/Gait  Ambulation/Gait assistance Supervision  Ambulation Distance (Feet) 400 Feet  Assistive device Rolling walker (2 wheeled)  Gait Pattern/deviations Step-through pattern  General Gait Details good reciprocal gait pattern this afternoon  PT - End of Session  Activity Tolerance Patient tolerated treatment well  Patient left in bed;with call bell/phone within reach;with family/visitor present  PT - Assessment/Plan  PT Plan Current plan remains appropriate  PT Frequency (ACUTE ONLY) 7X/week  Follow Up Recommendations Home health PT;Supervision for mobility/OOB  PT equipment None recommended by PT  PT Goal Progression  Progress  towards PT goals Progressing toward goals  PT General Charges  $$ ACUTE PT VISIT 1 Procedure  PT Treatments  $Gait Training 8-22 mins   Carmelia Bake, PT, DPT 01/04/2015 Pager: [phone]

## 2015-01-04 NOTE — Evaluation (Addendum)
Occupational Therapy Evaluation Patient Details Name: Eddie Price MRN: [id number] DOB: 17-Jul-1940 Today's Date: 01/04/2015    History of Present Illness 74 yo male s/p L TKA   Clinical Impression   Pt was admitted for the above surgery. He has had a hip sx in the past.  All education was completed. No further OT is needed at this time    Follow Up Recommendations  No OT follow up    Equipment Recommendations  None recommended by OT    Recommendations for Other Services       Precautions / Restrictions Knee precautions KI discontinued by PT      Mobility Bed Mobility         Supine to sit: Supervision     General bed mobility comments: HOB raised  Transfers   Equipment used: Rolling walker (2 wheeled) Transfers: Sit to/from Stand Sit to Stand: Min guard         General transfer comment: for safety    Balance                                            ADL Overall ADL's : Needs assistance/impaired                         Toilet Transfer: Min guard;Ambulation (simulated bed)       Tub/ Shower Transfer: Walk-in shower;Min guard;Ambulation     General ADL Comments: wife will assist with adls as needed; pt has access to all DME he needs.  Did not need any safety cues performing transfers and managed his leg independently into/out of bed     Vision     Perception     Praxis      Pertinent Vitals/Pain Pain Assessment: 0-10 Pain Score: 1  Pain Location: L knee Pain Descriptors / Indicators: Sore Pain Intervention(s): Limited activity within patient's tolerance;Premedicated before session;Monitored during session;Repositioned;Ice applied     Hand Dominance     Extremity/Trunk Assessment Upper Extremity Assessment Upper Extremity Assessment: Overall WFL for tasks assessed           Communication Communication Communication: No difficulties   Cognition Arousal/Alertness: Awake/alert Behavior During  Therapy: WFL for tasks assessed/performed Overall Cognitive Status: Within Functional Limits for tasks assessed                     General Comments       Exercises       Shoulder Instructions      Home Living Family/patient expects to be discharged to:: Private residence Living Arrangements: Spouse/significant other                 Bathroom Shower/Tub: Occupational psychologist: Standard     Home Equipment: Shower seat;Bedside commode;Walker - 2 wheels          Prior Functioning/Environment Level of Independence: Independent             OT Diagnosis: Acute pain   OT Problem List:     OT Treatment/Interventions:      OT Goals(Current goals can be found in the care plan section) Acute Rehab OT Goals Patient Stated Goal: to walk without pain OT Goal Formulation: All assessment and education complete, DC therapy  OT Frequency:     Barriers to D/C:  Co-evaluation              End of Session    Activity Tolerance: Patient tolerated treatment well;No increased pain Patient left: in bed;with call bell/phone within reach;with family/visitor present   Time: [id number] OT Time Calculation (min): 13 min Charges:   1 Eval G-Codes:    Rosalie Gelpi 2015/01/24, 11:12 AM  Lesle Chris, OTR/L [phone] 01/24/2015

## 2015-01-04 NOTE — Progress Notes (Signed)
Subjective: 1 Day Post-Op Procedure(s) (LRB): TOTAL KNEE ARTHROPLASTY (Left) Patient reports pain as mild to left knee.  No CP.SOB, or calf pain.  Objective: Vital signs in last 24 hours: Temp:  [97.5 F (36.4 C)-98.3 F (36.8 C)] 98.1 F (36.7 C) (12/09 0444) Pulse Rate:  [64-87] 68 (12/09 0444) Resp:  [11-21] 18 (12/09 0444) BP: (100-133)/(56-84) 100/61 mmHg (12/09 0444) SpO2:  [96 %-99 %] 97 % (12/09 0444) Weight:  [105.235 kg (232 lb)] 105.235 kg (232 lb) (12/08 1130)  Intake/Output from previous day: 12/08 0701 - 12/09 0700 In: 4813.8 [P.O.:1560; I.V.:3153.8; IV Piggyback:100] Out: 1940 [Urine:1370; Drains:570] Intake/Output this shift:     Recent Labs  01/04/15 0543  HGB 12.4*    Recent Labs  01/04/15 0543  WBC 12.9*  RBC 3.82*  HCT 36.8*  PLT 187    Recent Labs  01/04/15 0543  NA 138  K 4.7  CL 108  CO2 23  BUN 15  CREATININE 1.12  GLUCOSE 176*  CALCIUM 8.4*   No results for input(s): LABPT, INR in the last 72 hours.  Alert and oriented x3. RRR, Lungs clear, BS x4. Left Calf soft and non tender. L knee dressing C/D/I. No DVT signs. No signs of infection or compartment syndrome. LLE grossly neurovascularly intact.   Assessment/Plan: 1 Day Post-Op Left TKA Up with PT Continue care Plan d/c home tomorrow  Lajean Manes 01/04/2015, 7:55 AM

## 2015-01-04 NOTE — Discharge Summary (Signed)
Physician Discharge Summary  Patient ID: Eddie Price MRN: [id number] DOB/AGE: March 20, 1940 74 y.o.  Admit date: 01/03/2015 Discharge date: 01/05/15  Admission Diagnoses: left knee on  Discharge Diagnoses:  Active Problems:   S/P knee replacement   Osteoarthritis of left knee   Discharged Condition: good  Hospital Course:  MARSHALL ELLIFF is a 74 y.o. who was admitted to [facility]. They were brought to the operating room on 01/03/2015 and underwent Procedure(s): TOTAL KNEE ARTHROPLASTY.  Patient tolerated the procedure well and was later transferred to the recovery room and then to the orthopaedic floor for postoperative care.  They were given PO and IV analgesics for pain control following their surgery.  They were given 24 hours of postoperative antibiotics of  Anti-infectives    Start     Dose/Rate Route Frequency Ordered Stop   01/03/15 1400  ceFAZolin (ANCEF) IVPB 2 g/50 mL premix     2 g 100 mL/hr over 30 Minutes Intravenous Every 6 hours 01/03/15 1128 01/03/15 1938   01/03/15 0537  ceFAZolin (ANCEF) IVPB 2 g/50 mL premix     2 g 100 mL/hr over 30 Minutes Intravenous On call to O.R. 01/03/15 [id number] 01/03/15 0757     and started on DVT prophylaxis in the form of lovenox.   PT and OT were ordered for total joint protocol.  Discharge planning consulted to help with postop disposition and equipment needs.  Patient had a good night on the evening of surgery and started to get up OOB with therapy on day one.  Hemovac drain was pulled without difficulty.  Continued to work with therapy into day two.  Dressing was with normal limits.  The patient had progressed with therapy and meeting their goals. Patient was seen in rounds and was ready to go home.  Consults: n/a  Significant Diagnostic Studies: routine  Treatments: routine  Discharge Exam: Blood pressure 100/61, pulse 68, temperature 98.1 F (36.7 C), temperature source Oral, resp. rate 18, height 5\' 11"  (1.803 m),  weight 105.235 kg (232 lb), SpO2 97 %. Alert and oriented x3. RRR, Lungs clear, BS x4. Left Calf soft and non tender. L knee dressing C/D/I. No DVT signs. No signs of infection or compartment syndrome. LLE grossly neurovascularly intact.   Disposition: 01-Home or Self Care  Discharge Instructions    Call MD / Call 911    Complete by:  As directed   If you experience chest pain or shortness of breath, CALL 911 and be transported to the hospital emergency room.  If you develope a fever above 101 F, pus (white drainage) or increased drainage or redness at the wound, or calf pain, call your surgeon's office.     Constipation Prevention    Complete by:  As directed   Drink plenty of fluids.  Prune juice may be helpful.  You may use a stool softener, such as Colace (over the counter) 100 mg twice a day.  Use MiraLax (over the counter) for constipation as needed.     Diet - low sodium heart healthy    Complete by:  As directed      Discharge instructions    Complete by:  As directed   INSTRUCTIONS AFTER JOINT REPLACEMENT   Remove items at home which could result in a fall. This includes throw rugs or furniture in walking pathways ICE to the affected joint every three hours while awake for 30 minutes at a time, for at least the first 3-5 days, and  then as needed for pain and swelling.  Continue to use ice for pain and swelling. You may notice swelling that will progress down to the foot and ankle.  This is normal after surgery.  Elevate your leg when you are not up walking on it.   Continue to use the breathing machine you got in the hospital (incentive spirometer) which will help keep your temperature down.  It is common for your temperature to cycle up and down following surgery, especially at night when you are not up moving around and exerting yourself.  The breathing machine keeps your lungs expanded and your temperature down.   DIET:  As you were doing prior to hospitalization, we recommend a  well-balanced diet.  DRESSING / WOUND CARE / SHOWERING  Keep the surgical dressing until follow up.  The dressing is water proof, so you can shower without any extra covering.  IF THE DRESSING FALLS OFF or the wound gets wet inside, change the dressing with sterile gauze.  Please use good hand washing techniques before changing the dressing.  Do not use any lotions or creams on the incision until instructed by your surgeon.    ACTIVITY  Increase activity slowly as tolerated, but follow the weight bearing instructions below.   No driving for 6 weeks or until further direction given by your physician.  You cannot drive while taking narcotics.  No lifting or carrying greater than 10 lbs. until further directed by your surgeon. Avoid periods of inactivity such as sitting longer than an hour when not asleep. This helps prevent blood clots.  You may return to work once you are authorized by your doctor.     WEIGHT BEARING   Weight bearing as tolerated with assist device (walker, cane, etc) as directed, use it as long as suggested by your surgeon or therapist, typically at least 4-6 weeks.   EXERCISES  Results after joint replacement surgery are often greatly improved when you follow the exercise, range of motion and muscle strengthening exercises prescribed by your doctor. Safety measures are also important to protect the joint from further injury. Any time any of these exercises cause you to have increased pain or swelling, decrease what you are doing until you are comfortable again and then slowly increase them. If you have problems or questions, call your caregiver or physical therapist for advice.   Rehabilitation is important following a joint replacement. After just a few days of immobilization, the muscles of the leg can become weakened and shrink (atrophy).  These exercises are designed to build up the tone and strength of the thigh and leg muscles and to improve motion. Often times heat  used for twenty to thirty minutes before working out will loosen up your tissues and help with improving the range of motion but do not use heat for the first two weeks following surgery (sometimes heat can increase post-operative swelling).   These exercises can be done on a training (exercise) mat, on the floor, on a table or on a bed. Use whatever works the best and is most comfortable for you.    Use music or television while you are exercising so that the exercises are a pleasant break in your day. This will make your life better with the exercises acting as a break in your routine that you can look forward to.   Perform all exercises about fifteen times, three times per day or as directed.  You should exercise both the operative leg and the  other leg as well.   Exercises include:   Quad Sets - Tighten up the muscle on the front of the thigh (Quad) and hold for 5-10 seconds.   Straight Leg Raises - With your knee straight (if you were given a brace, keep it on), lift the leg to 60 degrees, hold for 3 seconds, and slowly lower the leg.  Perform this exercise against resistance later as your leg gets stronger.  Leg Slides: Lying on your back, slowly slide your foot toward your buttocks, bending your knee up off the floor (only go as far as is comfortable). Then slowly slide your foot back down until your leg is flat on the floor again.  Angel Wings: Lying on your back spread your legs to the side as far apart as you can without causing discomfort.  Hamstring Strength:  Lying on your back, push your heel against the floor with your leg straight by tightening up the muscles of your buttocks.  Repeat, but this time bend your knee to a comfortable angle, and push your heel against the floor.  You may put a pillow under the heel to make it more comfortable if necessary.   A rehabilitation program following joint replacement surgery can speed recovery and prevent re-injury in the future due to weakened  muscles. Contact your doctor or a physical therapist for more information on knee rehabilitation.    CONSTIPATION  Constipation is defined medically as fewer than three stools per week and severe constipation as less than one stool per week.  Even if you have a regular bowel pattern at home, your normal regimen is likely to be disrupted due to multiple reasons following surgery.  Combination of anesthesia, postoperative narcotics, change in appetite and fluid intake all can affect your bowels.   YOU MUST use at least one of the following options; they are listed in order of increasing strength to get the job done.  They are all available over the counter, and you may need to use some, POSSIBLY even all of these options:    Drink plenty of fluids (prune juice may be helpful) and high fiber foods Colace 100 mg by mouth twice a day  Senokot for constipation as directed and as needed Dulcolax (bisacodyl), take with full glass of water  Miralax (polyethylene glycol) once or twice a day as needed.  If you have tried all these things and are unable to have a bowel movement in the first 3-4 days after surgery call either your surgeon or your primary doctor.    If you experience loose stools or diarrhea, hold the medications until you stool forms back up.  If your symptoms do not get better within 1 week or if they get worse, check with your doctor.  If you experience "the worst abdominal pain ever" or develop nausea or vomiting, please contact the office immediately for further recommendations for treatment.   ITCHING:  If you experience itching with your medications, try taking only a single pain pill, or even half a pain pill at a time.  You can also use Benadryl over the counter for itching or also to help with sleep.   TED HOSE STOCKINGS:  Use stockings on both legs until for at least 2 weeks or as directed by physician office. They may be removed at night for sleeping.  MEDICATIONS:  See your  medication summary on the "After Visit Summary" that nursing will review with you.  You may have some home medications which  will be placed on hold until you complete the course of blood thinner medication.  It is important for you to complete the blood thinner medication as prescribed.  PRECAUTIONS:  If you experience chest pain or shortness of breath - call 911 immediately for transfer to the hospital emergency department.   If you develop a fever greater that 101 F, purulent drainage from wound, increased redness or drainage from wound, foul odor from the wound/dressing, or calf pain - CONTACT YOUR SURGEON.                                                   FOLLOW-UP APPOINTMENTS:  If you do not already have a post-op appointment, please call the office for an appointment to be seen by your surgeon.  Guidelines for how soon to be seen are listed in your "After Visit Summary", but are typically between 1-4 weeks after surgery.  OTHER INSTRUCTIONS:   Knee Replacement:  Do not place pillow under knee, focus on keeping the knee straight while resting. CPM instructions: 0-90 degrees, 2 hours in the morning, 2 hours in the afternoon, and 2 hours in the evening. Place foam block, curve side up under heel at all times except when in CPM or when walking.  DO NOT modify, tear, cut, or change the foam block in any way.  MAKE SURE YOU:  Understand these instructions.  Get help right away if you are not doing well or get worse.    Thank you for letting us be a part of your medical care team.  It is a privilege we respect greatly.  We hope these instructions will help you stay on track for a fast and full recovery!     Increase activity slowly as tolerated    Complete by:  As directed             Medication List    TAKE these medications        apixaban 5 MG Tabs tablet  Commonly known as:  ELIQUIS  Take 1 tablet (5 mg total) by mouth 2 (two) times daily.     aspirin EC 81 MG tablet  Take 81 mg  by mouth daily.     atenolol 50 MG tablet  Commonly known as:  TENORMIN  Take 1 tablet in the morning (50mg ) and take 1/2 tablet in the evening(25mg )     atorvastatin 40 MG tablet  Commonly known as:  LIPITOR  TAKE ONE-HALF TABLET BY MOUTH ONCE DAILY AT  6  PM     methocarbamol 500 MG tablet  Commonly known as:  ROBAXIN  Take 1 tablet (500 mg total) by mouth every 8 (eight) hours as needed for muscle spasms.     multivitamin capsule  Take 1 capsule by mouth daily.     nitroGLYCERIN 0.4 MG SL tablet  Commonly known as:  NITROSTAT  Place 1 tablet (0.4 mg total) under the tongue every 5 (five) minutes x 3 doses as needed for chest pain.     Omega 3 1000 MG Caps  Take 2,000 mg by mouth 2 (two) times daily.     oxyCODONE-acetaminophen 5-325 MG tablet  Commonly known as:  ROXICET  Take 1-2 tablets by mouth every 4 (four) hours as needed for severe pain.     ranolazine 500 MG 12 hr tablet  Commonly known as:  RANEXA  Take 1 tablet (500 mg total) by mouth 2 (two) times daily.     sertraline 100 MG tablet  Commonly known as:  ZOLOFT  TAKE ONE TABLET BY MOUTH DAILY     vitamin E 400 UNIT capsule  Generic drug:  vitamin E  Take 800 Units by mouth daily.       F/u with DR. Collins in 2 weeks   Signed: Lajean Manes 01/04/2015, 7:59 AM

## 2015-01-05 LAB — CBC
HEMATOCRIT: 35.3 % — AB (ref 39.0–52.0)
HEMOGLOBIN: 11.8 g/dL — AB (ref 13.0–17.0)
MCH: 32.5 pg (ref 26.0–34.0)
MCHC: 33.4 g/dL (ref 30.0–36.0)
MCV: 97.2 fL (ref 78.0–100.0)
Platelets: 162 10*3/uL (ref 150–400)
RBC: 3.63 MIL/uL — ABNORMAL LOW (ref 4.22–5.81)
RDW: 13 % (ref 11.5–15.5)
WBC: 10.4 10*3/uL (ref 4.0–10.5)

## 2015-01-05 NOTE — Progress Notes (Signed)
Physical Therapy Treatment Patient Details Name: Eddie Price MRN: [id number] DOB: 1940/05/28 Today's Date: 01/05/2015    History of Present Illness 74 yo male s/p L TKA    PT Comments    Continuing to perform well. Pt with increased soreness on today. Pain rated 7-8/10 during session. Practiced/reviewed gait training, exercises. All education completed. Ready to d/c from PT standpoint.  Follow Up Recommendations  Home health PT;Supervision for mobility/OOB     Equipment Recommendations  None recommended by PT    Recommendations for Other Services       Precautions / Restrictions Precautions Precautions: Knee Required Braces or Orthoses: Knee Immobilizer - Left Knee Immobilizer - Left: Discontinue once straight leg raise with < 10 degree lag Restrictions Weight Bearing Restrictions: No LLE Weight Bearing: Weight bearing as tolerated    Mobility  Bed Mobility Overal bed mobility: Needs Assistance Bed Mobility: Supine to Sit     Supine to sit: HOB elevated     General bed mobility comments: small amount of assist for L LE off bed. Increased time.   Transfers Overall transfer level: Needs assistance Equipment used: Rolling walker (2 wheeled) Transfers: Sit to/from Stand Sit to Stand: Supervision         General transfer comment: VCs for safety, hand placement  Ambulation/Gait Ambulation/Gait assistance: Supervision Ambulation Distance (Feet): 135 Feet Assistive device: Rolling walker (2 wheeled) Gait Pattern/deviations: Step-to pattern;Step-through pattern;Decreased stride length;Antalgic     General Gait Details: increased pain this session so reciprocal gait pattern not as smooth.    Stairs            Wheelchair Mobility    Modified Rankin (Stroke Patients Only)       Balance                                    Cognition Arousal/Alertness: Awake/alert Behavior During Therapy: WFL for tasks assessed/performed Overall  Cognitive Status: Within Functional Limits for tasks assessed                      Exercises Total Joint Exercises Ankle Circles/Pumps: AROM;Both;10 reps Quad Sets: AROM;Both;10 reps Hip ABduction/ADduction: AROM;Left;10 reps Straight Leg Raises: AROM;Left;10 reps Knee Flexion: AAROM;Left;10 reps;Seated Goniometric ROM: ~85 degrees    General Comments        Pertinent Vitals/Pain Pain Assessment: 0-10 Pain Score: 8  Pain Location: L knee Pain Descriptors / Indicators: Aching;Sore Pain Intervention(s): Monitored during session;Ice applied;Repositioned    Home Living                      Prior Function            PT Goals (current goals can now be found in the care plan section) Progress towards PT goals: Progressing toward goals    Frequency  7X/week    PT Plan Current plan remains appropriate    Co-evaluation             End of Session Equipment Utilized During Treatment: Gait belt Activity Tolerance: Patient tolerated treatment well Patient left: in chair;with call bell/phone within reach;with family/visitor present     Time: [id number] PT Time Calculation (min) (ACUTE ONLY): 31 min  Charges:  $Gait Training: 8-22 mins $Therapeutic Exercise: 8-22 mins                    G Codes:  Weston Anna, MPT Pager: [phone]

## 2015-01-05 NOTE — Progress Notes (Signed)
Eddie Price  MRN: [id number] DOB/Age: 1940-07-30 74 y.o. Physician: Eddie Price, Eddie.D. 2 Days Post-Op Procedure(s) (LRB): TOTAL KNEE ARTHROPLASTY (Left)  Subjective: Very rough night with severe pain at 2 am, just now with improving pain control. Good appetite Vital Signs Temp:  [97.9 F (36.6 C)-99.2 F (37.3 C)] 99.2 F (37.3 C) (12/10 0559) Pulse Rate:  [72-95] 77 (12/10 0559) Resp:  [16-20] 20 (12/10 0559) BP: (115-140)/(63-76) 117/63 mmHg (12/10 0559) SpO2:  [95 %-99 %] 95 % (12/10 0559)  Lab Results  Recent Labs  01/04/15 0543 01/05/15 0407  WBC 12.9* 10.4  HGB 12.4* 11.8*  HCT 36.8* 35.3*  PLT 187 162   BMET  Recent Labs  01/04/15 0543  NA 138  K 4.7  CL 108  CO2 23  GLUCOSE 176*  BUN 15  CREATININE 1.12  CALCIUM 8.4*   INR  Date Value Ref Range Status  12/28/2014 1.17 0.00 - 1.49 Final     Exam  Dressing dry, n/v intact  Plan Will plan for d/c this afternoon if improved pain control and good PT progress  Eddie Price Eddie Price 01/05/2015, 8:50 AM    Contact # [phone]

## 2015-01-07 ENCOUNTER — Ambulatory Visit: Payer: Medicare Other | Admitting: Cardiovascular Disease

## 2015-01-07 DIAGNOSIS — Z96652 Presence of left artificial knee joint: Secondary | ICD-10-CM | POA: Diagnosis not present

## 2015-01-07 DIAGNOSIS — I4891 Unspecified atrial fibrillation: Secondary | ICD-10-CM | POA: Diagnosis not present

## 2015-01-07 DIAGNOSIS — E785 Hyperlipidemia, unspecified: Secondary | ICD-10-CM | POA: Diagnosis not present

## 2015-01-07 DIAGNOSIS — I10 Essential (primary) hypertension: Secondary | ICD-10-CM | POA: Diagnosis not present

## 2015-01-07 DIAGNOSIS — F419 Anxiety disorder, unspecified: Secondary | ICD-10-CM | POA: Diagnosis not present

## 2015-01-07 DIAGNOSIS — Z471 Aftercare following joint replacement surgery: Secondary | ICD-10-CM | POA: Diagnosis not present

## 2015-01-07 DIAGNOSIS — I2510 Atherosclerotic heart disease of native coronary artery without angina pectoris: Secondary | ICD-10-CM | POA: Diagnosis not present

## 2015-01-07 DIAGNOSIS — F329 Major depressive disorder, single episode, unspecified: Secondary | ICD-10-CM | POA: Diagnosis not present

## 2015-01-08 DIAGNOSIS — R269 Unspecified abnormalities of gait and mobility: Secondary | ICD-10-CM | POA: Diagnosis not present

## 2015-01-08 DIAGNOSIS — I10 Essential (primary) hypertension: Secondary | ICD-10-CM | POA: Diagnosis not present

## 2015-01-08 DIAGNOSIS — M179 Osteoarthritis of knee, unspecified: Secondary | ICD-10-CM | POA: Diagnosis not present

## 2015-01-08 DIAGNOSIS — E785 Hyperlipidemia, unspecified: Secondary | ICD-10-CM | POA: Diagnosis not present

## 2015-01-09 DIAGNOSIS — F329 Major depressive disorder, single episode, unspecified: Secondary | ICD-10-CM | POA: Diagnosis not present

## 2015-01-09 DIAGNOSIS — E785 Hyperlipidemia, unspecified: Secondary | ICD-10-CM | POA: Diagnosis not present

## 2015-01-09 DIAGNOSIS — Z471 Aftercare following joint replacement surgery: Secondary | ICD-10-CM | POA: Diagnosis not present

## 2015-01-09 DIAGNOSIS — F419 Anxiety disorder, unspecified: Secondary | ICD-10-CM | POA: Diagnosis not present

## 2015-01-09 DIAGNOSIS — Z96652 Presence of left artificial knee joint: Secondary | ICD-10-CM | POA: Diagnosis not present

## 2015-01-09 DIAGNOSIS — I10 Essential (primary) hypertension: Secondary | ICD-10-CM | POA: Diagnosis not present

## 2015-01-09 DIAGNOSIS — I4891 Unspecified atrial fibrillation: Secondary | ICD-10-CM | POA: Diagnosis not present

## 2015-01-09 DIAGNOSIS — I2510 Atherosclerotic heart disease of native coronary artery without angina pectoris: Secondary | ICD-10-CM | POA: Diagnosis not present

## 2015-01-11 DIAGNOSIS — E785 Hyperlipidemia, unspecified: Secondary | ICD-10-CM | POA: Diagnosis not present

## 2015-01-11 DIAGNOSIS — I2510 Atherosclerotic heart disease of native coronary artery without angina pectoris: Secondary | ICD-10-CM | POA: Diagnosis not present

## 2015-01-11 DIAGNOSIS — I10 Essential (primary) hypertension: Secondary | ICD-10-CM | POA: Diagnosis not present

## 2015-01-11 DIAGNOSIS — Z96652 Presence of left artificial knee joint: Secondary | ICD-10-CM | POA: Diagnosis not present

## 2015-01-11 DIAGNOSIS — I4891 Unspecified atrial fibrillation: Secondary | ICD-10-CM | POA: Diagnosis not present

## 2015-01-11 DIAGNOSIS — Z471 Aftercare following joint replacement surgery: Secondary | ICD-10-CM | POA: Diagnosis not present

## 2015-01-11 DIAGNOSIS — F419 Anxiety disorder, unspecified: Secondary | ICD-10-CM | POA: Diagnosis not present

## 2015-01-11 DIAGNOSIS — F329 Major depressive disorder, single episode, unspecified: Secondary | ICD-10-CM | POA: Diagnosis not present

## 2015-01-14 DIAGNOSIS — F329 Major depressive disorder, single episode, unspecified: Secondary | ICD-10-CM | POA: Diagnosis not present

## 2015-01-14 DIAGNOSIS — Z96652 Presence of left artificial knee joint: Secondary | ICD-10-CM | POA: Diagnosis not present

## 2015-01-14 DIAGNOSIS — E785 Hyperlipidemia, unspecified: Secondary | ICD-10-CM | POA: Diagnosis not present

## 2015-01-14 DIAGNOSIS — F419 Anxiety disorder, unspecified: Secondary | ICD-10-CM | POA: Diagnosis not present

## 2015-01-14 DIAGNOSIS — I10 Essential (primary) hypertension: Secondary | ICD-10-CM | POA: Diagnosis not present

## 2015-01-14 DIAGNOSIS — I4891 Unspecified atrial fibrillation: Secondary | ICD-10-CM | POA: Diagnosis not present

## 2015-01-14 DIAGNOSIS — Z471 Aftercare following joint replacement surgery: Secondary | ICD-10-CM | POA: Diagnosis not present

## 2015-01-14 DIAGNOSIS — I2510 Atherosclerotic heart disease of native coronary artery without angina pectoris: Secondary | ICD-10-CM | POA: Diagnosis not present

## 2015-01-16 DIAGNOSIS — Z96652 Presence of left artificial knee joint: Secondary | ICD-10-CM | POA: Diagnosis not present

## 2015-01-16 DIAGNOSIS — E785 Hyperlipidemia, unspecified: Secondary | ICD-10-CM | POA: Diagnosis not present

## 2015-01-16 DIAGNOSIS — I4891 Unspecified atrial fibrillation: Secondary | ICD-10-CM | POA: Diagnosis not present

## 2015-01-16 DIAGNOSIS — I2510 Atherosclerotic heart disease of native coronary artery without angina pectoris: Secondary | ICD-10-CM | POA: Diagnosis not present

## 2015-01-16 DIAGNOSIS — I10 Essential (primary) hypertension: Secondary | ICD-10-CM | POA: Diagnosis not present

## 2015-01-16 DIAGNOSIS — Z471 Aftercare following joint replacement surgery: Secondary | ICD-10-CM | POA: Diagnosis not present

## 2015-01-16 DIAGNOSIS — F419 Anxiety disorder, unspecified: Secondary | ICD-10-CM | POA: Diagnosis not present

## 2015-01-16 DIAGNOSIS — F329 Major depressive disorder, single episode, unspecified: Secondary | ICD-10-CM | POA: Diagnosis not present

## 2015-01-17 DIAGNOSIS — Z471 Aftercare following joint replacement surgery: Secondary | ICD-10-CM | POA: Diagnosis not present

## 2015-01-17 DIAGNOSIS — F329 Major depressive disorder, single episode, unspecified: Secondary | ICD-10-CM | POA: Diagnosis not present

## 2015-01-17 DIAGNOSIS — E785 Hyperlipidemia, unspecified: Secondary | ICD-10-CM | POA: Diagnosis not present

## 2015-01-17 DIAGNOSIS — I4891 Unspecified atrial fibrillation: Secondary | ICD-10-CM | POA: Diagnosis not present

## 2015-01-17 DIAGNOSIS — I2510 Atherosclerotic heart disease of native coronary artery without angina pectoris: Secondary | ICD-10-CM | POA: Diagnosis not present

## 2015-01-17 DIAGNOSIS — Z96652 Presence of left artificial knee joint: Secondary | ICD-10-CM | POA: Diagnosis not present

## 2015-01-17 DIAGNOSIS — I10 Essential (primary) hypertension: Secondary | ICD-10-CM | POA: Diagnosis not present

## 2015-01-17 DIAGNOSIS — F419 Anxiety disorder, unspecified: Secondary | ICD-10-CM | POA: Diagnosis not present

## 2015-01-21 DIAGNOSIS — I10 Essential (primary) hypertension: Secondary | ICD-10-CM | POA: Diagnosis not present

## 2015-01-21 DIAGNOSIS — Z96652 Presence of left artificial knee joint: Secondary | ICD-10-CM | POA: Diagnosis not present

## 2015-01-21 DIAGNOSIS — I2510 Atherosclerotic heart disease of native coronary artery without angina pectoris: Secondary | ICD-10-CM | POA: Diagnosis not present

## 2015-01-21 DIAGNOSIS — F329 Major depressive disorder, single episode, unspecified: Secondary | ICD-10-CM | POA: Diagnosis not present

## 2015-01-21 DIAGNOSIS — F419 Anxiety disorder, unspecified: Secondary | ICD-10-CM | POA: Diagnosis not present

## 2015-01-21 DIAGNOSIS — I4891 Unspecified atrial fibrillation: Secondary | ICD-10-CM | POA: Diagnosis not present

## 2015-01-21 DIAGNOSIS — Z471 Aftercare following joint replacement surgery: Secondary | ICD-10-CM | POA: Diagnosis not present

## 2015-01-21 DIAGNOSIS — E785 Hyperlipidemia, unspecified: Secondary | ICD-10-CM | POA: Diagnosis not present

## 2015-01-23 DIAGNOSIS — Z471 Aftercare following joint replacement surgery: Secondary | ICD-10-CM | POA: Diagnosis not present

## 2015-01-23 DIAGNOSIS — I2510 Atherosclerotic heart disease of native coronary artery without angina pectoris: Secondary | ICD-10-CM | POA: Diagnosis not present

## 2015-01-23 DIAGNOSIS — E785 Hyperlipidemia, unspecified: Secondary | ICD-10-CM | POA: Diagnosis not present

## 2015-01-23 DIAGNOSIS — Z96652 Presence of left artificial knee joint: Secondary | ICD-10-CM | POA: Diagnosis not present

## 2015-01-23 DIAGNOSIS — F419 Anxiety disorder, unspecified: Secondary | ICD-10-CM | POA: Diagnosis not present

## 2015-01-23 DIAGNOSIS — I4891 Unspecified atrial fibrillation: Secondary | ICD-10-CM | POA: Diagnosis not present

## 2015-01-23 DIAGNOSIS — I10 Essential (primary) hypertension: Secondary | ICD-10-CM | POA: Diagnosis not present

## 2015-01-23 DIAGNOSIS — F329 Major depressive disorder, single episode, unspecified: Secondary | ICD-10-CM | POA: Diagnosis not present

## 2015-01-24 ENCOUNTER — Other Ambulatory Visit: Payer: Self-pay | Admitting: Cardiovascular Disease

## 2015-01-24 DIAGNOSIS — F419 Anxiety disorder, unspecified: Secondary | ICD-10-CM | POA: Diagnosis not present

## 2015-01-24 DIAGNOSIS — I10 Essential (primary) hypertension: Secondary | ICD-10-CM | POA: Diagnosis not present

## 2015-01-24 DIAGNOSIS — F329 Major depressive disorder, single episode, unspecified: Secondary | ICD-10-CM | POA: Diagnosis not present

## 2015-01-24 DIAGNOSIS — I2510 Atherosclerotic heart disease of native coronary artery without angina pectoris: Secondary | ICD-10-CM | POA: Diagnosis not present

## 2015-01-24 DIAGNOSIS — Z471 Aftercare following joint replacement surgery: Secondary | ICD-10-CM | POA: Diagnosis not present

## 2015-01-24 DIAGNOSIS — I4891 Unspecified atrial fibrillation: Secondary | ICD-10-CM | POA: Diagnosis not present

## 2015-01-24 DIAGNOSIS — E785 Hyperlipidemia, unspecified: Secondary | ICD-10-CM | POA: Diagnosis not present

## 2015-01-24 DIAGNOSIS — Z96652 Presence of left artificial knee joint: Secondary | ICD-10-CM | POA: Diagnosis not present

## 2015-01-25 MED ORDER — RANOLAZINE ER 500 MG PO TB12: ORAL_TABLET | ORAL | Status: DC

## 2015-01-25 NOTE — Telephone Encounter (Signed)
Refill complete 

## 2015-01-29 ENCOUNTER — Encounter (HOSPITAL_COMMUNITY): Payer: Self-pay | Admitting: Emergency Medicine

## 2015-01-29 ENCOUNTER — Emergency Department (HOSPITAL_COMMUNITY): Payer: Medicare Other

## 2015-01-29 ENCOUNTER — Emergency Department (HOSPITAL_COMMUNITY)
Admission: EM | Admit: 2015-01-29 | Discharge: 2015-01-29 | Disposition: A | Discharge status: Home / Self Care | Payer: Medicare Other | Attending: Emergency Medicine | Admitting: Emergency Medicine

## 2015-01-29 DIAGNOSIS — Z7901 Long term (current) use of anticoagulants: Secondary | ICD-10-CM | POA: Insufficient documentation

## 2015-01-29 DIAGNOSIS — F419 Anxiety disorder, unspecified: Secondary | ICD-10-CM | POA: Insufficient documentation

## 2015-01-29 DIAGNOSIS — M9689 Other intraoperative and postprocedural complications and disorders of the musculoskeletal system: Secondary | ICD-10-CM | POA: Insufficient documentation

## 2015-01-29 DIAGNOSIS — Z4789 Encounter for other orthopedic aftercare: Secondary | ICD-10-CM | POA: Diagnosis not present

## 2015-01-29 DIAGNOSIS — E785 Hyperlipidemia, unspecified: Secondary | ICD-10-CM | POA: Diagnosis not present

## 2015-01-29 DIAGNOSIS — I2510 Atherosclerotic heart disease of native coronary artery without angina pectoris: Secondary | ICD-10-CM | POA: Diagnosis not present

## 2015-01-29 DIAGNOSIS — Z87891 Personal history of nicotine dependence: Secondary | ICD-10-CM | POA: Diagnosis not present

## 2015-01-29 DIAGNOSIS — M1990 Unspecified osteoarthritis, unspecified site: Secondary | ICD-10-CM | POA: Insufficient documentation

## 2015-01-29 DIAGNOSIS — I10 Essential (primary) hypertension: Secondary | ICD-10-CM | POA: Diagnosis not present

## 2015-01-29 DIAGNOSIS — Z7982 Long term (current) use of aspirin: Secondary | ICD-10-CM | POA: Insufficient documentation

## 2015-01-29 DIAGNOSIS — Y658 Other specified misadventures during surgical and medical care: Secondary | ICD-10-CM | POA: Insufficient documentation

## 2015-01-29 DIAGNOSIS — M25562 Pain in left knee: Secondary | ICD-10-CM | POA: Diagnosis not present

## 2015-01-29 DIAGNOSIS — Z79899 Other long term (current) drug therapy: Secondary | ICD-10-CM | POA: Insufficient documentation

## 2015-01-29 DIAGNOSIS — Z9889 Other specified postprocedural states: Secondary | ICD-10-CM | POA: Insufficient documentation

## 2015-01-29 DIAGNOSIS — Z86010 Personal history of colonic polyps: Secondary | ICD-10-CM | POA: Insufficient documentation

## 2015-01-29 DIAGNOSIS — M7989 Other specified soft tissue disorders: Secondary | ICD-10-CM | POA: Diagnosis not present

## 2015-01-29 DIAGNOSIS — G8918 Other acute postprocedural pain: Secondary | ICD-10-CM | POA: Diagnosis present

## 2015-01-29 DIAGNOSIS — R2242 Localized swelling, mass and lump, left lower limb: Secondary | ICD-10-CM | POA: Diagnosis not present

## 2015-01-29 DIAGNOSIS — Z96652 Presence of left artificial knee joint: Secondary | ICD-10-CM | POA: Insufficient documentation

## 2015-01-29 DIAGNOSIS — F329 Major depressive disorder, single episode, unspecified: Secondary | ICD-10-CM | POA: Insufficient documentation

## 2015-01-29 DIAGNOSIS — R2689 Other abnormalities of gait and mobility: Secondary | ICD-10-CM | POA: Diagnosis not present

## 2015-01-29 DIAGNOSIS — Z96651 Presence of right artificial knee joint: Secondary | ICD-10-CM | POA: Diagnosis not present

## 2015-01-29 LAB — BASIC METABOLIC PANEL
ANION GAP: 4 — AB (ref 5–15)
BUN: 13 mg/dL (ref 6–20)
CALCIUM: 9 mg/dL (ref 8.9–10.3)
CO2: 28 mmol/L (ref 22–32)
CREATININE: 1.07 mg/dL (ref 0.61–1.24)
Chloride: 105 mmol/L (ref 101–111)
GFR calc non Af Amer: >60 mL/min (ref >60)
Glucose, Bld: 106 mg/dL — ABNORMAL HIGH (ref 65–99)
Potassium: 4.6 mmol/L (ref 3.5–5.1)
SODIUM: 137 mmol/L (ref 135–145)

## 2015-01-29 NOTE — ED Notes (Signed)
Dec 8th with complete knee replacement.  Was seen via PT today for therapy and sent here for eval for possible blood clot.  Areas red, tender and swollen, rates pain 5/10.

## 2015-01-29 NOTE — Discharge Instructions (Signed)
You do not have a DVT or blood clot in your leg You may resume rehab and physical therapy Return for worsening swelling, worsening pain, difficulty breathing or chest pain, or any other symptoms concerning to you. Keep leg elevated at rest.   Edema Edema is an abnormal buildup of fluids. It is more common in your legs and thighs. Painless swelling of the feet and ankles is more likely as a person ages. It also is common in looser skin, like around your eyes. HOME CARE   Keep the affected body part above the level of the heart while lying down.  Do not sit still or stand for a long time.  Do not put anything right under your knees when you lie down.  Do not wear tight clothes on your upper legs.  Exercise your legs to help the puffiness (swelling) go down.  Wear elastic bandages or support stockings as told by your doctor.  A low-salt diet may help lessen the puffiness.  Only take medicine as told by your doctor. GET HELP IF:  Treatment is not working.  You have heart, liver, or kidney disease and notice that your skin looks puffy or shiny.  You have puffiness in your legs that does not get better when you raise your legs.  You have sudden weight gain for no reason. GET HELP RIGHT AWAY IF:   You have shortness of breath or chest pain.  You cannot breathe when you lie down.  You have pain, redness, or warmth in the areas that are puffy.  You have heart, liver, or kidney disease and get edema all of a sudden.  You have a fever and your symptoms get worse all of a sudden. MAKE SURE YOU:   Understand these instructions.  Will watch your condition.  Will get help right away if you are not doing well or get worse.   This information is not intended to replace advice given to you by your health care provider. Make sure you discuss any questions you have with your health care provider.   Document Released: 07/01/2007 Document Revised: 01/17/2013 Document Reviewed:  11/04/2012 Elsevier Interactive Patient Education [employer].

## 2015-01-29 NOTE — ED Provider Notes (Signed)
CSN: [id number]     Arrival date & time 01/29/15  1219 History   First MD Initiated Contact with Patient 01/29/15 1247     Chief Complaint  Patient presents with  . Leg Pain    left     (Consider location/radiation/quality/duration/timing/severity/associated sxs/prior Treatment) HPI   75 year old male who presents with left calf swelling and tenderness. History of hypertension, hyperlipidemia, atrial fibrillation on Eliquis, CAD status post stenting and recent left knee replacement on 01/03/2015. States that since surgery he has been having some swelling in his left lower extremity, that has slowly been improving. He presented to outpatient rehabilitation today, and there was concern that given his persistent swelling in his extremity that he needed to be ruled out for an acute DVT. He denies any fevers, chills, chest pain, difficulty breathing, coughing. States that his postsurgical wounds have been cleaned without any active drainage or increasing pain. States that pain has been well controlled with home oxycodone and muscle relaxants. he has been having increasing range of motion in his left knee since the knee replacement.  Past Medical History  Diagnosis Date  . Hypertension   . Hyperlipidemia   . Depression   . History of adenomatous polyp of colon     2009  . Atrial fibrillation [facility])     new on-set Jan 2016--  with good rate control on atenolol  per cardiologist note Dr Bronson Ing 04-09-2014  . S/P drug eluting coronary stent placement     RCA x1  06-17-2012  . CAD, multiple vessel cardiologist--  dr Bronson Ing    3 vessel CAD //  s/p DES x1 to ostial RCA 06-17-2012  . Anticoagulant long-term use   . Acute meniscal tear of left knee   . OA (osteoarthritis) of knee   . At risk for sleep apnea     STOP-BANG= 4       SENT TO PCP 05-04-2014  . Anginal pain ([place])     comes and goes; uses rest to relieve   . Dysrhythmia   . Shortness of breath dyspnea     with exercise   .  Anxiety    Past Surgical History  Procedure Laterality Date  . Percutaneous coronary stent intervention (pci-s) N/A 06/17/2012    Procedure: PERCUTANEOUS CORONARY STENT INTERVENTION (PCI-S);  Surgeon: Burnell Blanks, MD;  Location: [facility] CATH LAB;  Service: Cardiovascular;  Laterality: N/A;  DES x1 ostial RCA  . Knee arthroscopy Right 2005  (approx)  . Left elbow surgery  1969  . Total hip arthroplasty Right 05-25-2006  . Colonoscopy  last one 07-01-2010  . Transthoracic echocardiogram  02-08-2014    mild focal basal hypertrophy of the septum, ef 55-60%,  indeterminate diastolic function in setting of atrial fib./  possibly bicuspid AV, mild calcification without stenosis, trivial AR/  mild MR and PR/  moderate to severe LAE/  trivial PR/  moderate RAE  . Cardiac catheterization  06-15-2012   dr Angelena Form    Triple vessel CAD with severe ostial RCA stenosis, severe stenosis in the small caliber diagonal branch and small caliber OM1, OM2 branches/  preserved LVSF  . Tonsillectomy  1970  . Shoulder arthroscopy with rotator cuff repair Bilateral x2 left//   x1 right (last one , left in 2001)  . Laparoscopic cholecystectomy  2011    AND REPAIR UMBILICAL HERNIA  . Vasectomy  1970'sj  w/  general anesthesia  . Knee arthroscopy Left 05/08/2014    Procedure: LEFT ARTHROSCOPY KNEE WIGHT  DEBRIDEMENT PARTIAL LATERAL MENISCECTOMY AND CHONDROPLASTY;  Surgeon: Sydnee Cabal, MD;  Location: [place];  Service: Orthopedics;  Laterality: Left;  . Trauma to left finger      partially amputation secondary to lawnmower accident   . Total knee arthroplasty Left 01/03/2015    Procedure: TOTAL KNEE ARTHROPLASTY;  Surgeon: Sydnee Cabal, MD;  Location: [facility];  Service: Orthopedics;  Laterality: Left;   Family History  Problem Relation Age of Onset  . Heart failure Mother   . COPD Mother   . Depression Mother   . Mental illness Mother   . Heart failure Father   . Stroke Father   .  Heart attack Father   . Heart disease Father   . Hyperlipidemia Father   . Cancer Maternal Grandmother     cervical   Social History  Substance Use Topics  . Smoking status: Former Smoker -- 0.50 packs/day for 8 years    Types: Cigarettes    Start date: 02/02/1959    Quit date: 01/27/1968  . Smokeless tobacco: Former Systems developer    Types: [place] date: 01/26/1970  . Alcohol Use: No     Comment: RARE    Review of Systems 10/14 systems reviewed and are negative other than those stated in the HPI    Allergies  Sulfa antibiotics  Home Medications   Prior to Admission medications   Medication Sig Start Date End Date Taking? Authorizing Provider  apixaban (ELIQUIS) 5 MG TABS tablet Take 1 tablet (5 mg total) by mouth 2 (two) times daily. 04/09/14  Yes Herminio Commons, MD  aspirin EC 81 MG tablet Take 81 mg by mouth daily.   Yes Historical Provider, MD  atenolol (TENORMIN) 50 MG tablet Take 1 tablet in the morning (50mg ) and take 1/2 tablet in the evening(25mg ) Patient taking differently: Take 25-50 mg by mouth 2 (two) times daily. Take 1 tablet in the morning (50mg ) and take 1/2 tablet in the evening(25mg ) 12/05/14  Yes Imogene Burn, PA-C  atorvastatin (LIPITOR) 40 MG tablet TAKE ONE-HALF TABLET BY MOUTH ONCE DAILY AT  6  PM 07/19/14  Yes Alycia Rossetti, MD  methocarbamol (ROBAXIN) 500 MG tablet Take 1 tablet (500 mg total) by mouth every 8 (eight) hours as needed for muscle spasms. 01/04/15  Yes Bryson L Stilwell, PA-C  Multiple Vitamin (MULTIVITAMIN) capsule Take 1 capsule by mouth daily.   Yes Historical Provider, MD  nitroGLYCERIN (NITROSTAT) 0.4 MG SL tablet Place 1 tablet (0.4 mg total) under the tongue every 5 (five) minutes x 3 doses as needed for chest pain. 04/24/13  Yes Alycia Rossetti, MD  Omega 3 1000 MG CAPS Take 2,000 mg by mouth 2 (two) times daily.   Yes Historical Provider, MD  oxyCODONE-acetaminophen (ROXICET) 5-325 MG tablet Take 1-2 tablets by mouth every 4  (four) hours as needed for severe pain. 01/04/15  Yes Bryson L Stilwell, PA-C  ranolazine (RANEXA) 500 MG 12 hr tablet TAKE ONE TABLET (500 MG) BY MOUTH TWO TIMES DAILY 01/25/15  Yes Herminio Commons, MD  sertraline (ZOLOFT) 100 MG tablet TAKE ONE TABLET BY MOUTH DAILY Patient taking differently: TAKE ONE half TABLET (50mg )  BY MOUTH DAILY 06/29/14  Yes Alycia Rossetti, MD  vitamin E (VITAMIN E) 400 UNIT capsule Take 800 Units by mouth daily.   Yes Historical Provider, MD   BP 115/70 mmHg  Pulse 75  Temp(Src) 98 F (36.7 C) (Temporal)  Resp 16  Ht 5\' 11"  (1.803 m)  Wt 225 lb (102.059 kg)  BMI 31.39 kg/m2  SpO2 97% Physical Exam Physical Exam  Nursing note and vitals reviewed. Constitutional: Well developed, well nourished, non-toxic, and in no acute distress Head: Normocephalic and atraumatic.  Mouth/Throat: Oropharynx is clear and moist.  Neck: Normal range of motion. Neck supple.  Cardiovascular: Normal rate and regular rhythm. +2 DP pulses bilaterally   Pulmonary/Chest: Effort normal and breath sounds normal.  Abdominal: Soft. There is no tenderness. There is no rebound and no guarding.  Musculoskeletal: There is erythema and erythema involving the lower left extremity down to the feet. Post surgical wound well approximated and well healing over left knee without drainage or surrounding induration, ROM full and w/o pain of the left knee Neurological: Alert, no facial droop, fluent speech, moves all extremities symmetrically, sensation to light touch in tact in bilateral lower extremities. Skin: Skin is warm and dry.  Psychiatric: Cooperative  ED Course  Procedures (including critical care time) Labs Review Labs Reviewed  BASIC METABOLIC PANEL - Abnormal; Notable for the following:    Glucose, Bld 106 (*)    Anion gap 4 (*)    All other components within normal limits    Imaging Review US Venous Img Lower Unilateral Left  01/29/2015  CLINICAL DATA:  Status post total knee  arthroplasty 1 month prior with persistent pain and swelling left lower extremity EXAM: LEFT LOWER EXTREMITY VENOUS DUPLEX ULTRASOUND TECHNIQUE: Gray-scale sonography with graded compression, as well as color Doppler and duplex ultrasound were performed to evaluate the left lower extremity deep venous system from the level of the common femoral vein and including the common femoral, femoral, profunda femoral, popliteal and calf veins including the posterior tibial, peroneal and gastrocnemius veins when visible. The superficial great saphenous vein was also interrogated. Spectral Doppler was utilized to evaluate flow at rest and with distal augmentation maneuvers in the common femoral, femoral and popliteal veins. COMPARISON:  None. FINDINGS: Contralateral Common Femoral Vein: Respiratory phasicity is normal and symmetric with the symptomatic side. No evidence of thrombus. Normal compressibility. Common Femoral Vein: No evidence of thrombus. Normal compressibility, respiratory phasicity and response to augmentation. Saphenofemoral Junction: No evidence of thrombus. Normal compressibility and flow on color Doppler imaging. Profunda Femoral Vein: No evidence of thrombus. Normal compressibility and flow on color Doppler imaging. Femoral Vein: No evidence of thrombus. Normal compressibility, respiratory phasicity and response to augmentation. Popliteal Vein: No evidence of thrombus. Normal compressibility, respiratory phasicity and response to augmentation. Calf Veins: No evidence of thrombus. Normal compressibility and flow on color Doppler imaging. Superficial Great Saphenous Vein: No evidence of thrombus. Normal compressibility and flow on color Doppler imaging. Venous Reflux:  None. Other Findings:  None. IMPRESSION: No evidence of left lower extremity deep venous thrombosis. Right common femoral vein also patent. Electronically Signed   By: Lowella Grip III M.D.   On: 01/29/2015 14:03   I have personally  reviewed and evaluated these images and lab results as part of my medical decision-making.   EKG Interpretation None      MDM   Final diagnoses:  Status post total left knee replacement  Left leg swelling    75 year old male status post left total knee arthroplasty in early December who presents with persistent edema of his left lower extremity. He is well-appearing and in no acute distress. Vital signs are stable. His left lower extremity is neurovascularly intact. His postsurgical wound is clean dry and intact without evidence of  overlying infection. There is edema as well as mild erythema involving the left lower extremity distal to the knee. He is currently on a pizza began for his atrial fibrillation, making DVT less likely. Left lower extremity ultrasound is performed revealing no evidence of lower extremity DVT. He is without symptoms that would be concerning for PE. We discussed continued supportive care for home and he is cleared for physical therapy. Strict return and follow-up instructions are reviewed. He expressed understanding of all discharge instructions and felt comfortable with the plan of care.   Forde Dandy, MD 01/29/15 [phone]

## 2015-01-31 DIAGNOSIS — M1712 Unilateral primary osteoarthritis, left knee: Secondary | ICD-10-CM | POA: Diagnosis not present

## 2015-01-31 DIAGNOSIS — M25562 Pain in left knee: Secondary | ICD-10-CM | POA: Diagnosis not present

## 2015-01-31 DIAGNOSIS — R2689 Other abnormalities of gait and mobility: Secondary | ICD-10-CM | POA: Diagnosis not present

## 2015-01-31 DIAGNOSIS — Z4789 Encounter for other orthopedic aftercare: Secondary | ICD-10-CM | POA: Diagnosis not present

## 2015-02-05 DIAGNOSIS — R2689 Other abnormalities of gait and mobility: Secondary | ICD-10-CM | POA: Diagnosis not present

## 2015-02-05 DIAGNOSIS — Z96652 Presence of left artificial knee joint: Secondary | ICD-10-CM | POA: Diagnosis not present

## 2015-02-05 DIAGNOSIS — M25562 Pain in left knee: Secondary | ICD-10-CM | POA: Diagnosis not present

## 2015-02-05 DIAGNOSIS — Z4789 Encounter for other orthopedic aftercare: Secondary | ICD-10-CM | POA: Diagnosis not present

## 2015-02-07 DIAGNOSIS — M25562 Pain in left knee: Secondary | ICD-10-CM | POA: Diagnosis not present

## 2015-02-07 DIAGNOSIS — Z96652 Presence of left artificial knee joint: Secondary | ICD-10-CM | POA: Diagnosis not present

## 2015-02-07 DIAGNOSIS — Z4789 Encounter for other orthopedic aftercare: Secondary | ICD-10-CM | POA: Diagnosis not present

## 2015-02-07 DIAGNOSIS — R2689 Other abnormalities of gait and mobility: Secondary | ICD-10-CM | POA: Diagnosis not present

## 2015-02-12 DIAGNOSIS — R2689 Other abnormalities of gait and mobility: Secondary | ICD-10-CM | POA: Diagnosis not present

## 2015-02-12 DIAGNOSIS — M25562 Pain in left knee: Secondary | ICD-10-CM | POA: Diagnosis not present

## 2015-02-12 DIAGNOSIS — Z96652 Presence of left artificial knee joint: Secondary | ICD-10-CM | POA: Diagnosis not present

## 2015-02-12 DIAGNOSIS — Z4789 Encounter for other orthopedic aftercare: Secondary | ICD-10-CM | POA: Diagnosis not present

## 2015-02-14 DIAGNOSIS — M25562 Pain in left knee: Secondary | ICD-10-CM | POA: Diagnosis not present

## 2015-02-14 DIAGNOSIS — Z4789 Encounter for other orthopedic aftercare: Secondary | ICD-10-CM | POA: Diagnosis not present

## 2015-02-14 DIAGNOSIS — R2689 Other abnormalities of gait and mobility: Secondary | ICD-10-CM | POA: Diagnosis not present

## 2015-02-14 DIAGNOSIS — Z96652 Presence of left artificial knee joint: Secondary | ICD-10-CM | POA: Diagnosis not present

## 2015-02-19 DIAGNOSIS — Z4789 Encounter for other orthopedic aftercare: Secondary | ICD-10-CM | POA: Diagnosis not present

## 2015-02-19 DIAGNOSIS — R2689 Other abnormalities of gait and mobility: Secondary | ICD-10-CM | POA: Diagnosis not present

## 2015-02-19 DIAGNOSIS — Z96652 Presence of left artificial knee joint: Secondary | ICD-10-CM | POA: Diagnosis not present

## 2015-02-19 DIAGNOSIS — M25562 Pain in left knee: Secondary | ICD-10-CM | POA: Diagnosis not present

## 2015-02-21 DIAGNOSIS — L812 Freckles: Secondary | ICD-10-CM | POA: Diagnosis not present

## 2015-02-21 DIAGNOSIS — D225 Melanocytic nevi of trunk: Secondary | ICD-10-CM | POA: Diagnosis not present

## 2015-02-21 DIAGNOSIS — D1801 Hemangioma of skin and subcutaneous tissue: Secondary | ICD-10-CM | POA: Diagnosis not present

## 2015-02-21 DIAGNOSIS — L821 Other seborrheic keratosis: Secondary | ICD-10-CM | POA: Diagnosis not present

## 2015-02-26 DIAGNOSIS — Z4789 Encounter for other orthopedic aftercare: Secondary | ICD-10-CM | POA: Diagnosis not present

## 2015-02-26 DIAGNOSIS — M25562 Pain in left knee: Secondary | ICD-10-CM | POA: Diagnosis not present

## 2015-02-26 DIAGNOSIS — Z96652 Presence of left artificial knee joint: Secondary | ICD-10-CM | POA: Diagnosis not present

## 2015-02-26 DIAGNOSIS — R2689 Other abnormalities of gait and mobility: Secondary | ICD-10-CM | POA: Diagnosis not present

## 2015-02-28 DIAGNOSIS — M25562 Pain in left knee: Secondary | ICD-10-CM | POA: Diagnosis not present

## 2015-02-28 DIAGNOSIS — Z96652 Presence of left artificial knee joint: Secondary | ICD-10-CM | POA: Diagnosis not present

## 2015-02-28 DIAGNOSIS — R2689 Other abnormalities of gait and mobility: Secondary | ICD-10-CM | POA: Diagnosis not present

## 2015-02-28 DIAGNOSIS — Z4789 Encounter for other orthopedic aftercare: Secondary | ICD-10-CM | POA: Diagnosis not present

## 2015-03-05 DIAGNOSIS — R2689 Other abnormalities of gait and mobility: Secondary | ICD-10-CM | POA: Diagnosis not present

## 2015-03-05 DIAGNOSIS — M25562 Pain in left knee: Secondary | ICD-10-CM | POA: Diagnosis not present

## 2015-03-05 DIAGNOSIS — Z96652 Presence of left artificial knee joint: Secondary | ICD-10-CM | POA: Diagnosis not present

## 2015-03-05 DIAGNOSIS — Z4789 Encounter for other orthopedic aftercare: Secondary | ICD-10-CM | POA: Diagnosis not present

## 2015-03-06 DIAGNOSIS — Z96652 Presence of left artificial knee joint: Secondary | ICD-10-CM | POA: Diagnosis not present

## 2015-03-06 DIAGNOSIS — Z471 Aftercare following joint replacement surgery: Secondary | ICD-10-CM | POA: Diagnosis not present

## 2015-03-07 DIAGNOSIS — R2689 Other abnormalities of gait and mobility: Secondary | ICD-10-CM | POA: Diagnosis not present

## 2015-03-07 DIAGNOSIS — Z4789 Encounter for other orthopedic aftercare: Secondary | ICD-10-CM | POA: Diagnosis not present

## 2015-03-07 DIAGNOSIS — Z96652 Presence of left artificial knee joint: Secondary | ICD-10-CM | POA: Diagnosis not present

## 2015-03-07 DIAGNOSIS — M25562 Pain in left knee: Secondary | ICD-10-CM | POA: Diagnosis not present

## 2015-03-12 DIAGNOSIS — R2689 Other abnormalities of gait and mobility: Secondary | ICD-10-CM | POA: Diagnosis not present

## 2015-03-12 DIAGNOSIS — M25562 Pain in left knee: Secondary | ICD-10-CM | POA: Diagnosis not present

## 2015-03-12 DIAGNOSIS — Z96652 Presence of left artificial knee joint: Secondary | ICD-10-CM | POA: Diagnosis not present

## 2015-03-12 DIAGNOSIS — Z4789 Encounter for other orthopedic aftercare: Secondary | ICD-10-CM | POA: Diagnosis not present

## 2015-03-14 ENCOUNTER — Ambulatory Visit (INDEPENDENT_AMBULATORY_CARE_PROVIDER_SITE_OTHER): Payer: Medicare Other | Admitting: Cardiovascular Disease

## 2015-03-14 ENCOUNTER — Encounter: Payer: Self-pay | Admitting: Cardiovascular Disease

## 2015-03-14 VITALS — BP 116/84 | HR 70 | Ht 71.0 in | Wt 232.0 lb

## 2015-03-14 DIAGNOSIS — R2689 Other abnormalities of gait and mobility: Secondary | ICD-10-CM | POA: Diagnosis not present

## 2015-03-14 DIAGNOSIS — Z4789 Encounter for other orthopedic aftercare: Secondary | ICD-10-CM | POA: Diagnosis not present

## 2015-03-14 DIAGNOSIS — I482 Chronic atrial fibrillation: Secondary | ICD-10-CM

## 2015-03-14 DIAGNOSIS — Z955 Presence of coronary angioplasty implant and graft: Secondary | ICD-10-CM

## 2015-03-14 DIAGNOSIS — I4821 Permanent atrial fibrillation: Secondary | ICD-10-CM

## 2015-03-14 DIAGNOSIS — R002 Palpitations: Secondary | ICD-10-CM

## 2015-03-14 DIAGNOSIS — Q231 Congenital insufficiency of aortic valve: Secondary | ICD-10-CM

## 2015-03-14 DIAGNOSIS — M25562 Pain in left knee: Secondary | ICD-10-CM | POA: Diagnosis not present

## 2015-03-14 DIAGNOSIS — E785 Hyperlipidemia, unspecified: Secondary | ICD-10-CM

## 2015-03-14 DIAGNOSIS — Z96652 Presence of left artificial knee joint: Secondary | ICD-10-CM | POA: Diagnosis not present

## 2015-03-14 DIAGNOSIS — Q2381 Bicuspid aortic valve: Secondary | ICD-10-CM

## 2015-03-14 DIAGNOSIS — I10 Essential (primary) hypertension: Secondary | ICD-10-CM | POA: Diagnosis not present

## 2015-03-14 DIAGNOSIS — R0609 Other forms of dyspnea: Secondary | ICD-10-CM

## 2015-03-14 DIAGNOSIS — I25118 Atherosclerotic heart disease of native coronary artery with other forms of angina pectoris: Secondary | ICD-10-CM

## 2015-03-14 NOTE — Patient Instructions (Signed)
Medication Instructions:  YOU MAY TAKE AN EXTRA 25 MG OF ATENOLOL AS NEEDED FOR PALPITATIONS  Labwork: NONE  Testing/Procedures:  NONE  Follow-Up: Your physician wants you to follow-up in: 6 MONTHS.  You will receive a reminder letter in the mail two months in advance. If you don't receive a letter, please call our office to schedule the follow-up appointment.   Any Other Special Instructions Will Be Listed Below (If Applicable).     If you need a refill on your cardiac medications before your next appointment, please call your pharmacy.

## 2015-03-14 NOTE — Progress Notes (Signed)
Patient ID: Eddie Price, male   DOB: August 13, 1940, 75 y.o.   MRN: [id number]      SUBJECTIVE: The patient returns for routine follow-up for coronary artery disease and chest pain. He also has atrial fibrillation. Nuclear stress test in 11/2014 was low risk but did demonstrate inferior wall ischemia. Holter monitoring showed rate-controlled atrial fibrillation with PVC's.  Ranexa made him feel sick to his stomach. He underwent left total knee replacement surgery 10 weeks ago. He has some shortness of breath with exertion but hopes this will improve once he is able to begin walking regularly again. He did have some rapid palpitations with atrial fibrillation and heart rates in the 160 beat per minute range last week during physical therapy.   Review of Systems: As per "subjective", otherwise negative.  Allergies  Allergen Reactions  . Sulfa Antibiotics Rash    Current Outpatient Prescriptions  Medication Sig Dispense Refill  . apixaban (ELIQUIS) 5 MG TABS tablet Take 1 tablet (5 mg total) by mouth 2 (two) times daily. 180 tablet 3  . aspirin EC 81 MG tablet Take 81 mg by mouth daily.    Marland Kitchen atenolol (TENORMIN) 50 MG tablet Take 1 tablet in the morning (50mg ) and take 1/2 tablet in the evening(25mg ) (Patient taking differently: Take 25-50 mg by mouth 2 (two) times daily. Take 1 tablet in the morning (50mg ) and take 1/2 tablet in the evening(25mg )) 135 tablet 3  . atorvastatin (LIPITOR) 40 MG tablet TAKE ONE-HALF TABLET BY MOUTH ONCE DAILY AT  6  PM 90 tablet 1  . Multiple Vitamin (MULTIVITAMIN) capsule Take 1 capsule by mouth daily.    . nitroGLYCERIN (NITROSTAT) 0.4 MG SL tablet Place 1 tablet (0.4 mg total) under the tongue every 5 (five) minutes x 3 doses as needed for chest pain. 30 tablet 3  . Omega 3 1000 MG CAPS Take 2,000 mg by mouth 2 (two) times daily.    . sertraline (ZOLOFT) 100 MG tablet TAKE ONE TABLET BY MOUTH DAILY (Patient taking differently: TAKE ONE half TABLET (50mg )  BY  MOUTH DAILY) 90 tablet 1  . vitamin E (VITAMIN E) 400 UNIT capsule Take 800 Units by mouth daily.     No current facility-administered medications for this visit.    Past Medical History  Diagnosis Date  . Hypertension   . Hyperlipidemia   . Depression   . History of adenomatous polyp of colon     2009  . Atrial fibrillation [facility])     new on-set Jan 2016--  with good rate control on atenolol  per cardiologist note Dr Bronson Ing 04-09-2014  . S/P drug eluting coronary stent placement     RCA x1  06-17-2012  . CAD, multiple vessel cardiologist--  dr Bronson Ing    3 vessel CAD //  s/p DES x1 to ostial RCA 06-17-2012  . Anticoagulant long-term use   . Acute meniscal tear of left knee   . OA (osteoarthritis) of knee   . At risk for sleep apnea     STOP-BANG= 4       SENT TO PCP 05-04-2014  . Anginal pain ([place])     comes and goes; uses rest to relieve   . Dysrhythmia   . Shortness of breath dyspnea     with exercise   . Anxiety     Past Surgical History  Procedure Laterality Date  . Percutaneous coronary stent intervention (pci-s) N/A 06/17/2012    Procedure: PERCUTANEOUS CORONARY STENT INTERVENTION (PCI-S);  Surgeon: Burnell Blanks, MD;  Location: [facility] CATH LAB;  Service: Cardiovascular;  Laterality: N/A;  DES x1 ostial RCA  . Knee arthroscopy Right 2005  (approx)  . Left elbow surgery  1969  . Total hip arthroplasty Right 05-25-2006  . Colonoscopy  last one 07-01-2010  . Transthoracic echocardiogram  02-08-2014    mild focal basal hypertrophy of the septum, ef 55-60%,  indeterminate diastolic function in setting of atrial fib./  possibly bicuspid AV, mild calcification without stenosis, trivial AR/  mild MR and PR/  moderate to severe LAE/  trivial PR/  moderate RAE  . Cardiac catheterization  06-15-2012   dr Angelena Form    Triple vessel CAD with severe ostial RCA stenosis, severe stenosis in the small caliber diagonal branch and small caliber OM1, OM2 branches/  preserved  LVSF  . Tonsillectomy  1970  . Shoulder arthroscopy with rotator cuff repair Bilateral x2 left//   x1 right (last one , left in 2001)  . Laparoscopic cholecystectomy  2011    AND REPAIR UMBILICAL HERNIA  . Vasectomy  1970'sj  w/  general anesthesia  . Knee arthroscopy Left 05/08/2014    Procedure: LEFT ARTHROSCOPY KNEE WIGHT DEBRIDEMENT PARTIAL LATERAL MENISCECTOMY AND CHONDROPLASTY;  Surgeon: Sydnee Cabal, MD;  Location: [facility];  Service: Orthopedics;  Laterality: Left;  . Trauma to left finger      partially amputation secondary to lawnmower accident   . Total knee arthroplasty Left 01/03/2015    Procedure: TOTAL KNEE ARTHROPLASTY;  Surgeon: Sydnee Cabal, MD;  Location: [facility] ORS;  Service: Orthopedics;  Laterality: Left;    Social History   Social History  . Marital Status: Married    Spouse Name: N/A  . Number of Children: N/A  . Years of Education: N/A   Occupational History  . Not on file.   Social History Main Topics  . Smoking status: Former Smoker -- 0.50 packs/day for 8 years    Types: Cigarettes    Start date: 02/02/1959    Quit date: 01/27/1968  . Smokeless tobacco: Former Systems developer    Types: [place] date: 01/26/1970  . Alcohol Use: No     Comment: RARE  . Drug Use: No  . Sexual Activity: Not on file   Other Topics Concern  . Not on file   Social History Narrative     Filed Vitals:   03/14/15 1026  BP: 116/84  Pulse: 70  Height: 5\' 11"  (1.803 m)  Weight: 232 lb (105.235 kg)  SpO2: 97%    PHYSICAL EXAM General: NAD HEENT: Normal. Neck: No JVD, no thyromegaly. Lungs: Clear to auscultation bilaterally with normal respiratory effort. CV: Nondisplaced PMI.  Regular rate and irregular rhythm, normal S1/S2, no S3, no murmur. No pretibial or periankle edema.  No carotid bruit.   Abdomen: Soft, obese.  Neurologic: Alert and oriented.  Psych: Normal affect. Skin: Normal.  ECG: Most recent ECG reviewed.      ASSESSMENT AND  PLAN: 1. CAD with RCA stent and residual disease in small caliber diagonal and obtuse marginal branches: Symptomatically stable overall. Low risk nuclear study with inferior wall ischemia as noted above. Did have mid 40% RCA lesion in 2014. Did not tolerate Ranexa. Continue ASA, atenolol, and statin.   2. Palpitations and atrial fibrillation: Rate controlled on atenolol. Continue Eliquis. Can take extra 25 mg for recalcitrant palpitations. Currently takes 50 mg q am and 25 mg q pm.  3. Essential HTN: Controlled. No  changes.   4. Hyperlipidemia: Continue statin.   5. Possible bicuspid aortic valve: No need for additional imaging at this time. Would consider repeat echocardiogram in the future when deemed clinically indicated.   6. Exertional dyspnea: Will monitor for improvement once he is able to exercise regularly.  Dispo: f/u 6 months.   Kate Sable, M.D., F.A.C.C.

## 2015-03-19 DIAGNOSIS — Z4789 Encounter for other orthopedic aftercare: Secondary | ICD-10-CM | POA: Diagnosis not present

## 2015-03-19 DIAGNOSIS — R2689 Other abnormalities of gait and mobility: Secondary | ICD-10-CM | POA: Diagnosis not present

## 2015-03-19 DIAGNOSIS — M25562 Pain in left knee: Secondary | ICD-10-CM | POA: Diagnosis not present

## 2015-03-19 DIAGNOSIS — Z96652 Presence of left artificial knee joint: Secondary | ICD-10-CM | POA: Diagnosis not present

## 2015-03-21 DIAGNOSIS — R2689 Other abnormalities of gait and mobility: Secondary | ICD-10-CM | POA: Diagnosis not present

## 2015-03-21 DIAGNOSIS — Z96652 Presence of left artificial knee joint: Secondary | ICD-10-CM | POA: Diagnosis not present

## 2015-03-21 DIAGNOSIS — Z4789 Encounter for other orthopedic aftercare: Secondary | ICD-10-CM | POA: Diagnosis not present

## 2015-03-21 DIAGNOSIS — M25562 Pain in left knee: Secondary | ICD-10-CM | POA: Diagnosis not present

## 2015-03-26 DIAGNOSIS — R2689 Other abnormalities of gait and mobility: Secondary | ICD-10-CM | POA: Diagnosis not present

## 2015-03-26 DIAGNOSIS — Z96652 Presence of left artificial knee joint: Secondary | ICD-10-CM | POA: Diagnosis not present

## 2015-03-26 DIAGNOSIS — M25562 Pain in left knee: Secondary | ICD-10-CM | POA: Diagnosis not present

## 2015-03-26 DIAGNOSIS — Z4789 Encounter for other orthopedic aftercare: Secondary | ICD-10-CM | POA: Diagnosis not present

## 2015-03-27 DIAGNOSIS — Z471 Aftercare following joint replacement surgery: Secondary | ICD-10-CM | POA: Diagnosis not present

## 2015-03-27 DIAGNOSIS — Z96652 Presence of left artificial knee joint: Secondary | ICD-10-CM | POA: Diagnosis not present

## 2015-04-05 ENCOUNTER — Other Ambulatory Visit: Payer: Self-pay | Admitting: Family Medicine

## 2015-04-05 ENCOUNTER — Other Ambulatory Visit: Payer: Self-pay | Admitting: Cardiovascular Disease

## 2015-04-08 NOTE — Telephone Encounter (Signed)
Refill appropriate and filled per protocol. 

## 2015-04-26 DIAGNOSIS — Z7901 Long term (current) use of anticoagulants: Secondary | ICD-10-CM | POA: Diagnosis not present

## 2015-04-26 DIAGNOSIS — Z955 Presence of coronary angioplasty implant and graft: Secondary | ICD-10-CM | POA: Diagnosis not present

## 2015-04-26 DIAGNOSIS — I10 Essential (primary) hypertension: Secondary | ICD-10-CM | POA: Diagnosis not present

## 2015-04-26 DIAGNOSIS — R0789 Other chest pain: Secondary | ICD-10-CM | POA: Diagnosis not present

## 2015-04-26 DIAGNOSIS — R079 Chest pain, unspecified: Secondary | ICD-10-CM | POA: Diagnosis not present

## 2015-04-26 DIAGNOSIS — I4891 Unspecified atrial fibrillation: Secondary | ICD-10-CM | POA: Diagnosis not present

## 2015-04-26 DIAGNOSIS — E785 Hyperlipidemia, unspecified: Secondary | ICD-10-CM | POA: Diagnosis not present

## 2015-04-26 DIAGNOSIS — Z8249 Family history of ischemic heart disease and other diseases of the circulatory system: Secondary | ICD-10-CM | POA: Diagnosis not present

## 2015-04-26 DIAGNOSIS — I2510 Atherosclerotic heart disease of native coronary artery without angina pectoris: Secondary | ICD-10-CM | POA: Diagnosis not present

## 2015-04-26 DIAGNOSIS — J9811 Atelectasis: Secondary | ICD-10-CM | POA: Diagnosis not present

## 2015-04-26 DIAGNOSIS — Z888 Allergy status to other drugs, medicaments and biological substances status: Secondary | ICD-10-CM | POA: Diagnosis not present

## 2015-04-26 DIAGNOSIS — Z87891 Personal history of nicotine dependence: Secondary | ICD-10-CM | POA: Diagnosis not present

## 2015-04-27 DIAGNOSIS — I4891 Unspecified atrial fibrillation: Secondary | ICD-10-CM | POA: Diagnosis not present

## 2015-04-27 DIAGNOSIS — I2510 Atherosclerotic heart disease of native coronary artery without angina pectoris: Secondary | ICD-10-CM | POA: Diagnosis not present

## 2015-04-27 DIAGNOSIS — R0789 Other chest pain: Secondary | ICD-10-CM | POA: Diagnosis not present

## 2015-04-27 DIAGNOSIS — R079 Chest pain, unspecified: Secondary | ICD-10-CM | POA: Diagnosis not present

## 2015-04-29 ENCOUNTER — Encounter: Payer: Self-pay | Admitting: *Deleted

## 2015-04-30 ENCOUNTER — Ambulatory Visit (INDEPENDENT_AMBULATORY_CARE_PROVIDER_SITE_OTHER): Payer: Medicare Other | Admitting: Adult Health

## 2015-04-30 ENCOUNTER — Encounter: Payer: Self-pay | Admitting: Adult Health

## 2015-04-30 VITALS — BP 110/64 | HR 78 | Ht 71.0 in | Wt 226.0 lb

## 2015-04-30 DIAGNOSIS — I480 Paroxysmal atrial fibrillation: Secondary | ICD-10-CM

## 2015-04-30 DIAGNOSIS — I10 Essential (primary) hypertension: Secondary | ICD-10-CM

## 2015-04-30 DIAGNOSIS — E7800 Pure hypercholesterolemia, unspecified: Secondary | ICD-10-CM | POA: Diagnosis not present

## 2015-04-30 DIAGNOSIS — R0789 Other chest pain: Secondary | ICD-10-CM | POA: Diagnosis not present

## 2015-04-30 NOTE — Patient Instructions (Signed)

## 2015-04-30 NOTE — Progress Notes (Deleted)
Name: Eddie Price    DOB: 1940-10-07  Age: 75 y.o.  MR#: [id number]       PCP:  Vic Blackbird, MD      Insurance: Payor: [employer] MEDICARE / Plan: [facility] MEDICARE / Product Type: *No Product type* /   CC:   No chief complaint on file.   VS Filed Vitals:   04/30/15 1500  BP: 110/64  Pulse: 78  Height: 5\' 11"  (1.803 m)  Weight: 226 lb (102.513 kg)  SpO2: 97%    Weights Current Weight  04/30/15 226 lb (102.513 kg)  03/14/15 232 lb (105.235 kg)  01/29/15 225 lb (102.059 kg)    Blood Pressure  BP Readings from Last 3 Encounters:  04/30/15 110/64  03/14/15 116/84  01/29/15 115/70     Admit date:  (Not on file) Last encounter with RMR:  Visit date not found   Allergy Sulfa antibiotics  Current Outpatient Prescriptions  Medication Sig Dispense Refill  . aspirin EC 81 MG tablet Take 81 mg by mouth daily.    Marland Kitchen atenolol (TENORMIN) 50 MG tablet Take 1 tablet in the morning (50mg ) and take 1/2 tablet in the evening(25mg ) (Patient taking differently: Take 25-50 mg by mouth 2 (two) times daily. Take 1 tablet in the morning (50mg ) and take 1/2 tablet in the evening(25mg )) 135 tablet 3  . atorvastatin (LIPITOR) 40 MG tablet TAKE ONE-HALF TABLET BY MOUTH ONCE DAILY AT  6  PM 90 tablet 1  . ELIQUIS 5 MG TABS tablet TAKE ONE TABLET BY MOUTH TWICE DAILY 180 tablet 3  . Multiple Vitamin (MULTIVITAMIN) capsule Take 1 capsule by mouth daily.    . nitroGLYCERIN (NITROSTAT) 0.4 MG SL tablet Place 1 tablet (0.4 mg total) under the tongue every 5 (five) minutes x 3 doses as needed for chest pain. 30 tablet 3  . Omega 3 1000 MG CAPS Take 2,000 mg by mouth 2 (two) times daily.    . sertraline (ZOLOFT) 100 MG tablet TAKE ONE TABLET BY MOUTH DAILY (Patient taking differently: TAKE ONE half TABLET (50mg )  BY MOUTH DAILY) 90 tablet 1  . vitamin E (VITAMIN E) 400 UNIT capsule Take 800 Units by mouth daily.     No current facility-administered medications for this visit.    Discontinued  Meds:    Medications Discontinued During This Encounter  Medication Reason  . atenolol (TENORMIN) 50 MG tablet Error    Patient Active Problem List   Diagnosis Date Noted  . S/P knee replacement 01/03/2015  . Osteoarthritis of left knee 01/03/2015  . Chest pain 12/05/2014  . S/P left knee arthroscopy 05/08/2014  . Atrial fibrillation ([place]) 01/31/2014  . Olecranon bursitis 08/29/2013  . OA (osteoarthritis) of knee 08/29/2013  . Depression with anxiety 10/22/2012  . Obesity 10/22/2012  . Hyperlipidemia   . Postoperative ecchymosis 06/23/2012  . Coronary atherosclerosis of native coronary artery 06/16/2012  . Unstable angina ([place]) 06/16/2012  . Esophageal reflux 06/13/2012  . Essential hypertension, benign 06/13/2012  . Impotence of organic origin 06/13/2012  . Degeneration of cervical intervertebral disc 06/13/2012    LABS    Component Value Date/Time   NA 137 01/29/2015 1320   NA 138 01/04/2015 0543   NA 137 12/28/2014 1405   K 4.6 01/29/2015 1320   K 4.7 01/04/2015 0543   K 4.9 12/28/2014 1405   CL 105 01/29/2015 1320   CL 108 01/04/2015 0543   CL 104 12/28/2014 1405   CO2 28 01/29/2015 1320  CO2 23 01/04/2015 0543   CO2 29 12/28/2014 1405   GLUCOSE 106* 01/29/2015 1320   GLUCOSE 176* 01/04/2015 0543   GLUCOSE 102* 12/28/2014 1405   BUN 13 01/29/2015 1320   BUN 15 01/04/2015 0543   BUN 15 12/28/2014 1405   CREATININE 1.07 01/29/2015 1320   CREATININE 1.12 01/04/2015 0543   CREATININE 1.11 12/28/2014 1405   CREATININE 1.00 12/05/2014 1057   CREATININE 1.14 06/04/2014 1047   CREATININE 1.07 01/31/2014 1211   CALCIUM 9.0 01/29/2015 1320   CALCIUM 8.4* 01/04/2015 0543   CALCIUM 9.3 12/28/2014 1405   GFRNONAA >60 01/29/2015 1320   GFRNONAA >60 01/04/2015 0543   GFRNONAA >60 12/28/2014 1405   GFRAA >60 01/29/2015 1320   GFRAA >60 01/04/2015 0543   GFRAA >60 12/28/2014 1405   CMP     Component Value Date/Time   NA 137 01/29/2015 1320   K 4.6 01/29/2015  1320   CL 105 01/29/2015 1320   CO2 28 01/29/2015 1320   GLUCOSE 106* 01/29/2015 1320   BUN 13 01/29/2015 1320   CREATININE 1.07 01/29/2015 1320   CREATININE 1.00 12/05/2014 1057   CALCIUM 9.0 01/29/2015 1320   PROT 7.1 12/05/2014 1057   ALBUMIN 4.3 12/05/2014 1057   AST 21 12/05/2014 1057   ALT 22 12/05/2014 1057   ALKPHOS 45 12/05/2014 1057   BILITOT 1.0 12/05/2014 1057   GFRNONAA >60 01/29/2015 1320   GFRAA >60 01/29/2015 1320       Component Value Date/Time   WBC 10.4 01/05/2015 0407   WBC 12.9* 01/04/2015 0543   WBC 6.8 12/28/2014 1405   HGB 11.8* 01/05/2015 0407   HGB 12.4* 01/04/2015 0543   HGB 15.0 12/28/2014 1405   HCT 35.3* 01/05/2015 0407   HCT 36.8* 01/04/2015 0543   HCT 43.9 12/28/2014 1405   MCV 97.2 01/05/2015 0407   MCV 96.3 01/04/2015 0543   MCV 96.5 12/28/2014 1405    Lipid Panel     Component Value Date/Time   CHOL 81* 12/05/2014 1057   TRIG 75 12/05/2014 1057   HDL 26* 12/05/2014 1057   CHOLHDL 3.1 12/05/2014 1057   VLDL 15 12/05/2014 1057   LDLCALC 40 12/05/2014 1057    ABG    Component Value Date/Time   PHART 7.422 06/17/2012 1444   PCO2ART 34.5* 06/17/2012 1444   PO2ART 74.0* 06/17/2012 1444   HCO3 22.5 06/17/2012 1444   TCO2 24 06/17/2012 1444   ACIDBASEDEF 1.0 06/17/2012 1444   O2SAT 95.0 06/17/2012 1444     Lab Results  Component Value Date   TSH 1.279 01/31/2014   BNP (last 3 results) No results for input(s): BNP in the last 8760 hours.  ProBNP (last 3 results) No results for input(s): PROBNP in the last 8760 hours.  Cardiac Panel (last 3 results) No results for input(s): CKTOTAL, CKMB, TROPONINI, RELINDX in the last 72 hours.  Iron/TIBC/Ferritin/ %Sat No results found for: IRON, TIBC, FERRITIN, IRONPCTSAT   EKG Orders placed or performed in visit on 12/05/14  . EKG 12-Lead     Prior Assessment and Plan Problem List as of 04/30/2015      Cardiovascular and Mediastinum   Essential hypertension, benign   Last  Assessment & Plan 12/05/2014 Office Visit Written 12/05/2014  2:06 PM by Imogene Burn, PA-C    Blood pressure is stable      Coronary atherosclerosis of native coronary artery   Last Assessment & Plan 12/05/2014 Office Visit Written 12/05/2014  2:05 PM by  Imogene Burn, PA-C    Patient had PCI to the large RCA eyes/23/14. Now having angina for the past 6 weeks. Order Lexi scan Myoview to rule out ischemia.      Atrial fibrillation [facility])   Last Assessment & Plan 12/05/2014 Office Visit Written 12/05/2014  2:06 PM by Imogene Burn, PA-C    Patient with chronic atrial fibrillation. He is having some palpitations now and may be having fast heart rates. Increase atenolol to 50 mg in the morning 25 in the evening. Place an event monitor on him to rule out rapid atrial fibrillation.      Unstable angina [facility])   Last Assessment & Plan 03/20/2013 Office Visit Written 03/20/2013  3:59 PM by Lendon Colonel, NP    He has had no recurrence of angina. He has not had to take any nitroglycerin sublingual. I expressed my concerns with him being on Viagra and Revatio, and also having nitroglycerin. I have advised him that he is not to use the sildenafil and nitroglycerin within 24 hours of taking one or the other. He has not needed to use nitroglycerin at all. I have asked him to speak with his primary care physician about the complexity of this medication. He verbalizes understanding.        Digestive   Esophageal reflux     Musculoskeletal and Integument   Degeneration of cervical intervertebral disc   Olecranon bursitis   Last Assessment & Plan 08/29/2013 Office Visit Written 08/29/2013 12:49 PM by Alycia Rossetti, MD    I will go ahead and set him up with his orthopedist. He is starting had aspiration is currently on cephelaxin, ICE, no NSAIDS due to plavix, cardiac disease      OA (osteoarthritis) of knee   Last Assessment & Plan 08/29/2013 Office Visit Written 08/29/2013 12:51 PM by Alycia Rossetti,  MD    Refer to ortho as they  Have seen him for this before      Osteoarthritis of left knee     Genitourinary   Impotence of organic origin   Last Assessment & Plan 04/24/2013 Office Visit Written 04/24/2013  9:23 PM by Alycia Rossetti, MD    Viagra will be continued discussed the use of this with nitroglycerin which he is never required        Other   Hyperlipidemia   Last Assessment & Plan 08/29/2013 Office Visit Written 08/29/2013 12:50 PM by Alycia Rossetti, MD    Recheck FLP, goal LDL < 100      Postoperative ecchymosis   Last Assessment & Plan 06/23/2012 Office Visit Written 06/23/2012 12:33 PM by Lendon Colonel, NP    Area is thoroughly examined with significant skin discoloration of the groin, penis and testicle. Equisitely painful with movement and palpations. Has been ruled out for psuedoanerym.   He has been placed on vicodin by ER. I will start him tramadol 50 mg every 6-8 hours around the clock for the next few days and then prn. He is to put ice/heat on the site for comfort.       Depression with anxiety   Last Assessment & Plan 06/04/2014 Office Visit Written 06/04/2014  5:50 PM by Alycia Rossetti, MD     Continue with Zoloft at the current dose of 50 mg he does well with this medication      Obesity   S/P left knee arthroscopy   Chest pain   Last Assessment & Plan 12/05/2014  Office Visit Edited 12/05/2014  2:04 PM by Imogene Burn, PA-C    Patient comes in today complaining of six-week history of exertional chest tightness as well as chest tightness at rest associated with palpitations. He does have history of coronary artery disease with PCI of the RCA in 2014 with residual disease. He also had chest pain that correlated with heart rates in the 110-120 bpm on an event monitor in January 2016. Because of this I will increase his atenolol to 50 mg in the morning 20 5 in the evening. I will order a Lexi scan Myoview to rule out ischemia. I will also place a 48 hour monitor  on him to rule out uncontrolled atrial fibrillation. Follow-up with Dr.Koneswaran in 2 weeks. He is advised to call 911 or go to nearest emergency room if he has any prolonged chest pain. I'm also available to see him next week if needed. Patient is also scheduled for knee surgery in December. This will help enable [place] to clear him for surgery.      S/P knee replacement       Imaging: No results found.

## 2015-04-30 NOTE — Progress Notes (Signed)
Cardiology Office Note   Date:  04/30/2015   ID:  Eddie Price, DOB 11-01-1940, MRN [id number]  PCP:  Vic Blackbird, MD  Cardiologist: Woodroe Chen, NP   Chief Complaint  Patient presents with  . Atrial Fibrillation      History of Present Illness: Eddie Price is a 75 y.o. male who presents for ongoing assessment and management of CAD, atrial fibrillation, on ELIQUIS, CHADS VASC Score of 4, drug-eluting stent to the ostial right coronary artery, and 06/17/2012, chronic angina, hypertension, hyperlipidemia.  He was last seen by Dr. Bronson Ing on 03/14/2015.  He was continued on his current medication regimen, and was told take an extra dose of 25 mg of atenolol.  Should he have occasions of rapid heart rhythm.  He was to followup in 6 months.  The patient was in [place], shopping when he had sudden onset of what he described as fireworks in his chest, crescendo, decrescendo was mushrooming feeling.  This was occurring several times.  He states that it lasted approximately 30-40 seconds at a time and occurred approximately 20 times before deciding to be seen in the emergency room.  The patient was closest to the [facility], [facility], and chose to go there.  On initial evaluation in [place].  His EKG revealed atrial fibrillation with controlled ventricular response.  Troponins were cycled and found to be negative.  Blood pressure was well controlled.  He was kept overnight and ruled out for acute coronary syndrome.  Medication adjustments were made to include increasing statin therapy, although his cholesterol status was normal.  The patient did not take any additional atenolol for the symptoms as he did not feel his heart racing, he only felt the fullness in his chest as described.  Past Medical History  Diagnosis Date  . Hypertension   . Hyperlipidemia   . Depression   . History of adenomatous polyp of colon     2009  . Atrial fibrillation [facility])     new  on-set Jan 2016--  with good rate control on atenolol  per cardiologist note Dr Bronson Ing 04-09-2014  . S/P drug eluting coronary stent placement     RCA x1  06-17-2012  . CAD, multiple vessel cardiologist--  dr Bronson Ing    3 vessel CAD //  s/p DES x1 to ostial RCA 06-17-2012  . Anticoagulant long-term use   . Acute meniscal tear of left knee   . OA (osteoarthritis) of knee   . At risk for sleep apnea     STOP-BANG= 4       SENT TO PCP 05-04-2014  . Anginal pain ([place])     comes and goes; uses rest to relieve   . Dysrhythmia   . Shortness of breath dyspnea     with exercise   . Anxiety     Past Surgical History  Procedure Laterality Date  . Percutaneous coronary stent intervention (pci-s) N/A 06/17/2012    Procedure: PERCUTANEOUS CORONARY STENT INTERVENTION (PCI-S);  Surgeon: Burnell Blanks, MD;  Location: [facility] CATH LAB;  Service: Cardiovascular;  Laterality: N/A;  DES x1 ostial RCA  . Knee arthroscopy Right 2005  (approx)  . Left elbow surgery  1969  . Total hip arthroplasty Right 05-25-2006  . Colonoscopy  last one 07-01-2010  . Transthoracic echocardiogram  02-08-2014    mild focal basal hypertrophy of the septum, ef 55-60%,  indeterminate diastolic function in setting of atrial fib./  possibly bicuspid AV, mild calcification without stenosis, trivial  AR/  mild MR and PR/  moderate to severe LAE/  trivial PR/  moderate RAE  . Cardiac catheterization  06-15-2012   dr Angelena Form    Triple vessel CAD with severe ostial RCA stenosis, severe stenosis in the small caliber diagonal branch and small caliber OM1, OM2 branches/  preserved LVSF  . Tonsillectomy  1970  . Shoulder arthroscopy with rotator cuff repair Bilateral x2 left//   x1 right (last one , left in 2001)  . Laparoscopic cholecystectomy  2011    AND REPAIR UMBILICAL HERNIA  . Vasectomy  1970'sj  w/  general anesthesia  . Knee arthroscopy Left 05/08/2014    Procedure: LEFT ARTHROSCOPY KNEE WIGHT DEBRIDEMENT PARTIAL  LATERAL MENISCECTOMY AND CHONDROPLASTY;  Surgeon: Sydnee Cabal, MD;  Location: [place];  Service: Orthopedics;  Laterality: Left;  . Trauma to left finger      partially amputation secondary to lawnmower accident   . Total knee arthroplasty Left 01/03/2015    Procedure: TOTAL KNEE ARTHROPLASTY;  Surgeon: Sydnee Cabal, MD;  Location: [facility] ORS;  Service: Orthopedics;  Laterality: Left;     Current Outpatient Prescriptions  Medication Sig Dispense Refill  . aspirin EC 81 MG tablet Take 81 mg by mouth daily.    Marland Kitchen atenolol (TENORMIN) 50 MG tablet Take 1 tablet in the morning (50mg ) and take 1/2 tablet in the evening(25mg ) (Patient taking differently: Take 25-50 mg by mouth 2 (two) times daily. Take 1 tablet in the morning (50mg ) and take 1/2 tablet in the evening(25mg )) 135 tablet 3  . atorvastatin (LIPITOR) 40 MG tablet TAKE ONE-HALF TABLET BY MOUTH ONCE DAILY AT  6  PM 90 tablet 1  . ELIQUIS 5 MG TABS tablet TAKE ONE TABLET BY MOUTH TWICE DAILY 180 tablet 3  . Multiple Vitamin (MULTIVITAMIN) capsule Take 1 capsule by mouth daily.    . nitroGLYCERIN (NITROSTAT) 0.4 MG SL tablet Place 1 tablet (0.4 mg total) under the tongue every 5 (five) minutes x 3 doses as needed for chest pain. 30 tablet 3  . Omega 3 1000 MG CAPS Take 2,000 mg by mouth 2 (two) times daily.    . sertraline (ZOLOFT) 100 MG tablet TAKE ONE TABLET BY MOUTH DAILY (Patient taking differently: TAKE ONE half TABLET (50mg )  BY MOUTH DAILY) 90 tablet 1  . vitamin E (VITAMIN E) 400 UNIT capsule Take 800 Units by mouth daily.     No current facility-administered medications for this visit.    Allergies:   Sulfa antibiotics    Social History:  The patient  reports that he quit smoking about 47 years ago. His smoking use included Cigarettes. He started smoking about 56 years ago. He has a 4 pack-year smoking history. He quit smokeless tobacco use about 45 years ago. His smokeless tobacco use included Chew. He reports  that he does not drink alcohol or use illicit drugs.   Family History:  The patient's family history includes COPD in his mother; Cancer in his maternal grandmother; Depression in his mother; Heart attack in his father; Heart disease in his father; Heart failure in his father and mother; Hyperlipidemia in his father; Mental illness in his mother; Stroke in his father.    ROS: All other systems are reviewed and negative. Unless otherwise mentioned in H&P    PHYSICAL EXAM: VS:  BP 110/64 mmHg  Pulse 78  Ht 5\' 11"  (1.803 m)  Wt 226 lb (102.513 kg)  BMI 31.53 kg/m2  SpO2 97% , BMI Body  mass index is 31.53 kg/(m^2). GEN: Well nourished, well developed, in no acute distress HEENT: normal Neck: no JVD, carotid bruits, or masses Cardiac: RRR; no murmurs, rubs, or gallops,no edema  Respiratory:  clear to auscultation bilaterally, normal work of breathing GI: soft, nontender, nondistended, + BS MS: no deformity or atrophy Skin: warm and dry, no rash Neuro:  Strength and sensation are intact Psych: euthymic mood, full affect  Recent Labs: 12/05/2014: ALT 22 01/05/2015: Hemoglobin 11.8*; Platelets 162 01/29/2015: BUN 13; Creatinine, Ser 1.07; Potassium 4.6; Sodium 137    Lipid Panel    Component Value Date/Time   CHOL 81* 12/05/2014 1057   TRIG 75 12/05/2014 1057   HDL 26* 12/05/2014 1057   CHOLHDL 3.1 12/05/2014 1057   VLDL 15 12/05/2014 1057   LDLCALC 40 12/05/2014 1057      Wt Readings from Last 3 Encounters:  04/30/15 226 lb (102.513 kg)  03/14/15 232 lb (105.235 kg)  01/29/15 225 lb (102.059 kg)       ASSESSMENT AND PLAN:  1. Atrial fibrillation: The patient had symptoms of a crescendo, decrescendo, pressure, which he described as fireworks or mushrooming in his chest occurring up to 20 times before seeking treatment.  In [facility] emergency room.  Once he arrived at the ER.  The symptoms had already subsided.  They did not recur during his hospitalization.  Labs were  cycled and found to be negative for ACS.  EKG revealed atrial fibrillation with controlled rate.he will continue ELIQUIS.  We will continue watch and wait for now.  If he has recurrence of, symptoms, we will place a cardiac monitor for greater than 2 weeks, to evaluate for changes in his rhythm and rate, causing the symptoms.  Otherwise, he will continue on his current medication regimen.  A substantial amount of time during this office visit was spent on him describing his symptoms.  Hospitalization, and reviewing records from [facility].  2. Hypertension:blood pressure remains well-controlled.  Will not make any changes in his medication regimen.  3. Hypercholesterolemia:his statin medicine was increased to 40 mg daily.  Review of his labs revealed an very good cholesterol control, and he is advised to return to normal dose of Lipitor at 20 mg daily.   Current medicines are reviewed at length with the patient today.    Labs/ tests ordered today include:  No orders of the defined types were placed in this encounter.     Disposition:   FU with Dr. Bronson Ing in 3 months.  But will have cardiac monitor placed.  Should he have recurrence of symptoms.  This will be placed for 2 weeks to 30 days.  He is to call us   Signed, Jory Sims, NP  04/30/2015 4:45 PM    [facility] [address], [place], [place] [address] Phone: [phone]; Fax: [phone]

## 2015-06-21 ENCOUNTER — Encounter: Payer: Self-pay | Admitting: Family Medicine

## 2015-06-21 ENCOUNTER — Ambulatory Visit (INDEPENDENT_AMBULATORY_CARE_PROVIDER_SITE_OTHER): Payer: Medicare Other | Admitting: Family Medicine

## 2015-06-21 VITALS — BP 132/74 | HR 78 | Temp 98.8°F | Resp 16 | Ht 71.0 in | Wt 230.0 lb

## 2015-06-21 DIAGNOSIS — I10 Essential (primary) hypertension: Secondary | ICD-10-CM | POA: Diagnosis not present

## 2015-06-21 DIAGNOSIS — E669 Obesity, unspecified: Secondary | ICD-10-CM

## 2015-06-21 DIAGNOSIS — E785 Hyperlipidemia, unspecified: Secondary | ICD-10-CM | POA: Diagnosis not present

## 2015-06-21 DIAGNOSIS — F418 Other specified anxiety disorders: Secondary | ICD-10-CM

## 2015-06-21 DIAGNOSIS — I25118 Atherosclerotic heart disease of native coronary artery with other forms of angina pectoris: Secondary | ICD-10-CM

## 2015-06-21 NOTE — Progress Notes (Signed)
Patient ID: Eddie Price, male   DOB: 20-Mar-1940, 75 y.o.   MRN: [id number]    Subjective:    Patient ID: Eddie Price, male    DOB: May 23, 1940, 75 y.o.   MRN: [id number]  Patient presents for F/U Patient to follow-up chronic medical problems. He still followed by cardiology for his paroxysmal atrial fibrillation he also has coronary artery disease. He is taking his medications as prescribed she has not had any recent chest pain. He is on Eliquis for anti-coagulation. He was admitted to [facility] for chest pain and A fib. He states cardiology had records but nothing scanned into system  Hypertension he is taking his medication as prescribed no difficulties.  MDD- he continues on zoloft which helps his mood, he is on 50mg  once a day   These have some problems with his neck left knee status post arthroplasty about 6 months ago he still gets swelling which goes down to his ankle warmth and pain when he twisted he's even had a fall the past couple months. He does have follow-up with orthopedics in a couple weeks to see what more can be done.   Review Of Systems:  GEN- denies fatigue, fever, weight loss,weakness, recent illness HEENT- denies eye drainage, change in vision, nasal discharge, CVS- denies chest pain, palpitations RESP- denies SOB, cough, wheeze ABD- denies N/V, change in stools, abd pain GU- denies dysuria, hematuria, dribbling, incontinence MSK-+ joint pain, muscle aches, injury Neuro- denies headache, dizziness, syncope, seizure activity       Objective:    BP 132/74 mmHg  Pulse 78  Temp(Src) 98.8 F (37.1 C) (Oral)  Resp 16  Ht 5\' 11"  (1.803 m)  Wt 230 lb (104.327 kg)  BMI 32.09 kg/m2 GEN- NAD, alert and oriented x3 CVS- RRR, no murmur RESP-CTAB Psych- normal afffect and mood  EXT- trace left pedal edema Pulses- Radial, DP- 2+        Assessment & Plan:      Problem List Items Addressed This Visit    Obesity   Hyperlipidemia (Chronic)   Essential hypertension, benign - Primary (Chronic)    Well controlled, will obtain records from [place] with his labs and work up      Depression with anxiety    Continue zoloft, doing well with this      Coronary atherosclerosis of native coronary artery (Chronic)    Reviewed cardiology note, continue statin, ASA, BB         Note: This dictation was prepared with Dragon dictation along with smaller phrase technology. Any transcriptional errors that result from this process are unintentional.

## 2015-06-21 NOTE — Assessment & Plan Note (Signed)
Continue zoloft, doing well with this 

## 2015-06-21 NOTE — Assessment & Plan Note (Signed)
Reviewed cardiology note, continue statin, ASA, BB

## 2015-06-21 NOTE — Assessment & Plan Note (Signed)
Well controlled, will obtain records from [place] with his labs and work up

## 2015-06-21 NOTE — Patient Instructions (Signed)
Release of records- [facility], discharge summary and last set of labs  F/U as previous June

## 2015-07-15 ENCOUNTER — Other Ambulatory Visit: Payer: Self-pay | Admitting: Family Medicine

## 2015-07-16 NOTE — Telephone Encounter (Signed)
Refill appropriate and filled per protocol. 

## 2015-07-24 ENCOUNTER — Ambulatory Visit (INDEPENDENT_AMBULATORY_CARE_PROVIDER_SITE_OTHER): Payer: Medicare Other | Admitting: Family Medicine

## 2015-07-24 ENCOUNTER — Encounter: Payer: Self-pay | Admitting: Family Medicine

## 2015-07-24 VITALS — BP 128/72 | HR 70 | Temp 98.0°F | Resp 14 | Ht 71.0 in | Wt 232.0 lb

## 2015-07-24 DIAGNOSIS — E669 Obesity, unspecified: Secondary | ICD-10-CM

## 2015-07-24 DIAGNOSIS — I10 Essential (primary) hypertension: Secondary | ICD-10-CM

## 2015-07-24 DIAGNOSIS — Z125 Encounter for screening for malignant neoplasm of prostate: Secondary | ICD-10-CM

## 2015-07-24 DIAGNOSIS — E785 Hyperlipidemia, unspecified: Secondary | ICD-10-CM

## 2015-07-24 DIAGNOSIS — Z1211 Encounter for screening for malignant neoplasm of colon: Secondary | ICD-10-CM | POA: Diagnosis not present

## 2015-07-24 DIAGNOSIS — I25118 Atherosclerotic heart disease of native coronary artery with other forms of angina pectoris: Secondary | ICD-10-CM

## 2015-07-24 DIAGNOSIS — Z0000 Encounter for general adult medical examination without abnormal findings: Secondary | ICD-10-CM | POA: Diagnosis not present

## 2015-07-24 LAB — CBC WITH DIFFERENTIAL/PLATELET
BASOS ABS: 55 {cells}/uL (ref 0–200)
BASOS PCT: 1 %
EOS PCT: 6 %
Eosinophils Absolute: 330 cells/uL (ref 15–500)
HCT: 45 % (ref 38.5–50.0)
HEMOGLOBIN: 14.9 g/dL (ref 13.0–17.0)
LYMPHS ABS: 1980 {cells}/uL (ref 850–3900)
Lymphocytes Relative: 36 %
MCH: 32.3 pg (ref 27.0–33.0)
MCHC: 33.1 g/dL (ref 32.0–36.0)
MCV: 97.6 fL (ref 80.0–100.0)
MPV: 9.8 fL (ref 7.5–12.5)
Monocytes Absolute: 550 cells/uL (ref 200–950)
Monocytes Relative: 10 %
NEUTROS ABS: 2585 {cells}/uL (ref 1500–7800)
Neutrophils Relative %: 47 %
Platelets: 219 10*3/uL (ref 140–400)
RBC: 4.61 MIL/uL (ref 4.20–5.80)
RDW: 14.8 % (ref 11.0–15.0)
WBC: 5.5 10*3/uL (ref 3.8–10.8)

## 2015-07-24 NOTE — Patient Instructions (Signed)
F/U 6 months We will call with lab results Colonoscopy to be scheduled

## 2015-07-25 ENCOUNTER — Encounter: Payer: Self-pay | Admitting: Adult Health

## 2015-07-25 ENCOUNTER — Ambulatory Visit (INDEPENDENT_AMBULATORY_CARE_PROVIDER_SITE_OTHER): Payer: Medicare Other | Admitting: Adult Health

## 2015-07-25 ENCOUNTER — Other Ambulatory Visit: Payer: Self-pay | Admitting: *Deleted

## 2015-07-25 VITALS — BP 110/62 | HR 56 | Ht 66.0 in | Wt 231.0 lb

## 2015-07-25 DIAGNOSIS — I482 Chronic atrial fibrillation, unspecified: Secondary | ICD-10-CM

## 2015-07-25 DIAGNOSIS — I10 Essential (primary) hypertension: Secondary | ICD-10-CM | POA: Diagnosis not present

## 2015-07-25 LAB — COMPREHENSIVE METABOLIC PANEL
ALBUMIN: 4.3 g/dL (ref 3.6–5.1)
ALK PHOS: 43 U/L (ref 40–115)
ALT: 16 U/L (ref 9–46)
AST: 17 U/L (ref 10–35)
BILIRUBIN TOTAL: 1 mg/dL (ref 0.2–1.2)
BUN: 11 mg/dL (ref 7–25)
CHLORIDE: 105 mmol/L (ref 98–110)
CO2: 24 mmol/L (ref 20–31)
CREATININE: 1.06 mg/dL (ref 0.70–1.18)
Calcium: 9.3 mg/dL (ref 8.6–10.3)
Glucose, Bld: 97 mg/dL (ref 70–99)
Potassium: 5.2 mmol/L (ref 3.5–5.3)
SODIUM: 142 mmol/L (ref 135–146)
TOTAL PROTEIN: 7.1 g/dL (ref 6.1–8.1)

## 2015-07-25 LAB — LIPID PANEL
CHOLESTEROL: 82 mg/dL — AB (ref 125–200)
HDL: 28 mg/dL — ABNORMAL LOW (ref >=40)
LDL Cholesterol: 35 mg/dL (ref <130)
TRIGLYCERIDES: 94 mg/dL (ref <150)
Total CHOL/HDL Ratio: 2.9 Ratio (ref <=5.0)
VLDL: 19 mg/dL (ref <30)

## 2015-07-25 LAB — PSA, MEDICARE: PSA: 0.86 ng/mL (ref <=4.00)

## 2015-07-25 MED ORDER — ATORVASTATIN CALCIUM 10 MG PO TABS: 10.0000 mg | ORAL_TABLET | Freq: Every day | ORAL | Status: DC

## 2015-07-25 NOTE — Progress Notes (Signed)
Patient ID: Eddie Price, male   DOB: 1940/12/20, 75 y.o.   MRN: [id number]   Subjective:   Patient presents for Medicare Annual/Subsequent preventive examination.   No concerns Medications reviewed Has appt with cardiology tomorrow   Review Past Medical/Family/Social: Per EMR    Risk Factors  Current exercise habits: walks Dietary issues discussed: Yes  Cardiac risk factors: Obesity (BMI >= 30 kg/m2). CAD  Depression Screen  (Note: if answer to either of the following is "Yes", a more complete depression screening is indicated)  Over the past two weeks, have you felt down, depressed or hopeless? No Over the past two weeks, have you felt little interest or pleasure in doing things? No Have you lost interest or pleasure in daily life? No Do you often feel hopeless? No Do you cry easily over simple problems? No   Activities of Daily Living  In your present state of health, do you have any difficulty performing the following activities?:  Driving? No  Managing money? No  Feeding yourself? No  Getting from bed to chair? No  Climbing a flight of stairs? yes  Preparing food and eating?: No  Bathing or showering? No  Getting dressed: No  Getting to the toilet? No  Using the toilet:No  Moving around from place to place: No  In the past year have you fallen or had a near fall?:No  Are you sexually active? No  Do you have more than one partner? No   Hearing Difficulties: No  Do you often ask people to speak up or repeat themselves? No  Do you experience ringing or noises in your ears? No Do you have difficulty understanding soft or whispered voices? No  Do you feel that you have a problem with memory? No Do you often misplace items? No  Do you feel safe at home? Yes  Cognitive Testing  Alert? Yes Normal Appearance?Yes  Oriented to person? Yes Place? Yes  Time? Yes  Recall of three objects? Yes  Can perform simple calculations? Yes  Displays appropriate judgment?Yes  Can  read the correct time from a watch face?Yes   List the Names of Other Physician/Practitioners you currently use:  Cardiology, orthopedics    Screening Tests / Date Colonoscopy    - Due                  Zostavax  UTD Pneumonia- UTD Influenza Vaccine UTD Tetanus/tdap UTD  ROS: GEN- denies fatigue, fever, weight loss,weakness, recent illness HEENT- denies eye drainage, change in vision, nasal discharge, CVS- denies chest pain, palpitations RESP- denies SOB, cough, wheeze ABD- denies N/V, change in stools, abd pain GU- denies dysuria, hematuria, dribbling, incontinence MSK- +joint pain, muscle aches, injury Neuro- denies headache, dizziness, syncope, seizure activity  Physical: GEN- NAD, alert and oriented x3 HEENT- PERRL, EOMI, non injected sclera, pink conjunctiva, MMM, oropharynx clear Neck- Supple, no thryomegaly CVS- RRR, no murmur RESP-CTAB ABD-NABS,Soft, NT, ND GU- denied  EXT- tracce edema Pulses- Radial, DP- 2+      Assessment:    Annual wellness medicare exam   Plan:    During the course of the visit the patient was educated and counseled about appropriate screening and preventive services including:   Prevention UTD- due for repeat colonoscopy this year, due to polyps  He has living will  Screen NEG  for depression.  Diet review for nutrition referral? Yes ____ Not Indicated __x__  Patient Instructions (the written plan) was given to the patient.  Medicare  Attestation  I have personally reviewed:  The patient's medical and social history  Their use of alcohol, tobacco or illicit drugs  Their current medications and supplements  The patient's functional ability including ADLs,fall risks, home safety risks, cognitive, and hearing and visual impairment  Diet and physical activities  Evidence for depression or mood disorders  The patient's weight, height, BMI, and visual acuity have been recorded in the chart. I have made referrals, counseling, and provided  education to the patient based on review of the above and I have provided the patient with a written personalized care plan for preventive services.

## 2015-07-25 NOTE — Assessment & Plan Note (Signed)
Doing well, no CP no use of NTG

## 2015-07-25 NOTE — Progress Notes (Signed)
Name: Eddie Price    DOB: 04/30/1940  Age: 75 y.o.  MR#: [id number]       PCP:  Vic Blackbird, MD      Insurance: Payor: [employer] MEDICARE / Plan: [facility] MEDICARE / Product Type: *No Product type* /   CC:   No chief complaint on file.   VS There were no vitals filed for this visit.  Weights Current Weight  07/24/15 232 lb (105.235 kg)  06/21/15 230 lb (104.327 kg)  04/30/15 226 lb (102.513 kg)    Blood Pressure  BP Readings from Last 3 Encounters:  07/24/15 128/72  06/21/15 132/74  04/30/15 110/64     Admit date:  (Not on file) Last encounter with RMR:  04/30/2015   Allergy Sulfa antibiotics  Current Outpatient Prescriptions  Medication Sig Dispense Refill  . aspirin EC 81 MG tablet Take 81 mg by mouth daily.    Marland Kitchen atenolol (TENORMIN) 50 MG tablet TAKE ONE TABLET BY MOUTH ONCE DAILY 90 tablet 0  . atorvastatin (LIPITOR) 10 MG tablet Take 1 tablet (10 mg total) by mouth daily. 90 tablet 3  . ELIQUIS 5 MG TABS tablet TAKE ONE TABLET BY MOUTH TWICE DAILY 180 tablet 3  . Multiple Vitamin (MULTIVITAMIN) capsule Take 1 capsule by mouth daily.    . nitroGLYCERIN (NITROSTAT) 0.4 MG SL tablet Place 1 tablet (0.4 mg total) under the tongue every 5 (five) minutes x 3 doses as needed for chest pain. 30 tablet 3  . Omega 3 1000 MG CAPS Take 2,000 mg by mouth 2 (two) times daily.    . sertraline (ZOLOFT) 100 MG tablet TAKE ONE TABLET BY MOUTH DAILY (Patient taking differently: TAKE ONE half TABLET (50mg )  BY MOUTH DAILY) 90 tablet 1  . vitamin E (VITAMIN E) 400 UNIT capsule Take 800 Units by mouth daily.     No current facility-administered medications for this visit.    Discontinued Meds:   There are no discontinued medications.  Patient Active Problem List   Diagnosis Date Noted  . S/P knee replacement 01/03/2015  . Osteoarthritis of left knee 01/03/2015  . Chest pain 12/05/2014  . S/P left knee arthroscopy 05/08/2014  . Atrial fibrillation ([place]) 01/31/2014  . Olecranon  bursitis 08/29/2013  . OA (osteoarthritis) of knee 08/29/2013  . Depression with anxiety 10/22/2012  . Obesity 10/22/2012  . Hyperlipidemia   . Postoperative ecchymosis 06/23/2012  . Coronary atherosclerosis of native coronary artery 06/16/2012  . Unstable angina ([place]) 06/16/2012  . Esophageal reflux 06/13/2012  . Essential hypertension, benign 06/13/2012  . Impotence of organic origin 06/13/2012  . Degeneration of cervical intervertebral disc 06/13/2012    LABS    Component Value Date/Time   NA 142 07/24/2015 0925   NA 137 01/29/2015 1320   NA 138 01/04/2015 0543   K 5.2 07/24/2015 0925   K 4.6 01/29/2015 1320   K 4.7 01/04/2015 0543   CL 105 07/24/2015 0925   CL 105 01/29/2015 1320   CL 108 01/04/2015 0543   CO2 24 07/24/2015 0925   CO2 28 01/29/2015 1320   CO2 23 01/04/2015 0543   GLUCOSE 97 07/24/2015 0925   GLUCOSE 106* 01/29/2015 1320   GLUCOSE 176* 01/04/2015 0543   BUN 11 07/24/2015 0925   BUN 13 01/29/2015 1320   BUN 15 01/04/2015 0543   CREATININE 1.06 07/24/2015 0925   CREATININE 1.07 01/29/2015 1320   CREATININE 1.12 01/04/2015 0543   CREATININE 1.11 12/28/2014 1405   CREATININE 1.00  12/05/2014 1057   CREATININE 1.14 06/04/2014 1047   CALCIUM 9.3 07/24/2015 0925   CALCIUM 9.0 01/29/2015 1320   CALCIUM 8.4* 01/04/2015 0543   GFRNONAA >60 01/29/2015 1320   GFRNONAA >60 01/04/2015 0543   GFRNONAA >60 12/28/2014 1405   GFRAA >60 01/29/2015 1320   GFRAA >60 01/04/2015 0543   GFRAA >60 12/28/2014 1405   CMP     Component Value Date/Time   NA 142 07/24/2015 0925   K 5.2 07/24/2015 0925   CL 105 07/24/2015 0925   CO2 24 07/24/2015 0925   GLUCOSE 97 07/24/2015 0925   BUN 11 07/24/2015 0925   CREATININE 1.06 07/24/2015 0925   CREATININE 1.07 01/29/2015 1320   CALCIUM 9.3 07/24/2015 0925   PROT 7.1 07/24/2015 0925   ALBUMIN 4.3 07/24/2015 0925   AST 17 07/24/2015 0925   ALT 16 07/24/2015 0925   ALKPHOS 43 07/24/2015 0925   BILITOT 1.0 07/24/2015  0925   GFRNONAA >60 01/29/2015 1320   GFRAA >60 01/29/2015 1320       Component Value Date/Time   WBC 5.5 07/24/2015 0925   WBC 10.4 01/05/2015 0407   WBC 12.9* 01/04/2015 0543   HGB 14.9 07/24/2015 0925   HGB 11.8* 01/05/2015 0407   HGB 12.4* 01/04/2015 0543   HCT 45.0 07/24/2015 0925   HCT 35.3* 01/05/2015 0407   HCT 36.8* 01/04/2015 0543   MCV 97.6 07/24/2015 0925   MCV 97.2 01/05/2015 0407   MCV 96.3 01/04/2015 0543    Lipid Panel     Component Value Date/Time   CHOL 82* 07/24/2015 0925   TRIG 94 07/24/2015 0925   HDL 28* 07/24/2015 0925   CHOLHDL 2.9 07/24/2015 0925   VLDL 19 07/24/2015 0925   LDLCALC 35 07/24/2015 0925    ABG    Component Value Date/Time   PHART 7.422 06/17/2012 1444   PCO2ART 34.5* 06/17/2012 1444   PO2ART 74.0* 06/17/2012 1444   HCO3 22.5 06/17/2012 1444   TCO2 24 06/17/2012 1444   ACIDBASEDEF 1.0 06/17/2012 1444   O2SAT 95.0 06/17/2012 1444     Lab Results  Component Value Date   TSH 1.279 01/31/2014   BNP (last 3 results) No results for input(s): BNP in the last 8760 hours.  ProBNP (last 3 results) No results for input(s): PROBNP in the last 8760 hours.  Cardiac Panel (last 3 results) No results for input(s): CKTOTAL, CKMB, TROPONINI, RELINDX in the last 72 hours.  Iron/TIBC/Ferritin/ %Sat No results found for: IRON, TIBC, FERRITIN, IRONPCTSAT   EKG Orders placed or performed in visit on 12/05/14  . EKG 12-Lead     Prior Assessment and Plan Problem List as of 07/25/2015      Cardiovascular and Mediastinum   Essential hypertension, benign   Last Assessment & Plan 07/24/2015 Office Visit Written 07/25/2015 10:24 AM by Alycia Rossetti, MD    Well controlled, no change to meds      Coronary atherosclerosis of native coronary artery   Last Assessment & Plan 07/24/2015 Office Visit Written 07/25/2015 10:24 AM by Alycia Rossetti, MD    Doing well, no CP no use of NTG       Atrial fibrillation [facility])   Last Assessment & Plan  12/05/2014 Office Visit Written 12/05/2014  2:06 PM by Imogene Burn, PA-C    Patient with chronic atrial fibrillation. He is having some palpitations now and may be having fast heart rates. Increase atenolol to 50 mg in the morning 25 in  the evening. Place an event monitor on him to rule out rapid atrial fibrillation.      Unstable angina [facility])   Last Assessment & Plan 03/20/2013 Office Visit Written 03/20/2013  3:59 PM by Lendon Colonel, NP    He has had no recurrence of angina. He has not had to take any nitroglycerin sublingual. I expressed my concerns with him being on Viagra and Revatio, and also having nitroglycerin. I have advised him that he is not to use the sildenafil and nitroglycerin within 24 hours of taking one or the other. He has not needed to use nitroglycerin at all. I have asked him to speak with his primary care physician about the complexity of this medication. He verbalizes understanding.        Digestive   Esophageal reflux     Musculoskeletal and Integument   Degeneration of cervical intervertebral disc   Olecranon bursitis   Last Assessment & Plan 08/29/2013 Office Visit Written 08/29/2013 12:49 PM by Alycia Rossetti, MD    I will go ahead and set him up with his orthopedist. He is starting had aspiration is currently on cephelaxin, ICE, no NSAIDS due to plavix, cardiac disease      OA (osteoarthritis) of knee   Last Assessment & Plan 08/29/2013 Office Visit Written 08/29/2013 12:51 PM by Alycia Rossetti, MD    Refer to ortho as they  Have seen him for this before      Osteoarthritis of left knee     Genitourinary   Impotence of organic origin   Last Assessment & Plan 04/24/2013 Office Visit Written 04/24/2013  9:23 PM by Alycia Rossetti, MD    Viagra will be continued discussed the use of this with nitroglycerin which he is never required        Other   Hyperlipidemia   Last Assessment & Plan 08/29/2013 Office Visit Written 08/29/2013 12:50 PM by Alycia Rossetti, MD    Recheck FLP, goal LDL < 100      Postoperative ecchymosis   Last Assessment & Plan 06/23/2012 Office Visit Written 06/23/2012 12:33 PM by Lendon Colonel, NP    Area is thoroughly examined with significant skin discoloration of the groin, penis and testicle. Equisitely painful with movement and palpations. Has been ruled out for psuedoanerym.   He has been placed on vicodin by ER. I will start him tramadol 50 mg every 6-8 hours around the clock for the next few days and then prn. He is to put ice/heat on the site for comfort.       Depression with anxiety   Last Assessment & Plan 06/21/2015 Office Visit Written 06/21/2015  3:02 PM by Alycia Rossetti, MD    Continue zoloft, doing well with this      Obesity   S/P left knee arthroscopy   Chest pain   Last Assessment & Plan 12/05/2014 Office Visit Edited 12/05/2014  2:04 PM by Imogene Burn, PA-C    Patient comes in today complaining of six-week history of exertional chest tightness as well as chest tightness at rest associated with palpitations. He does have history of coronary artery disease with PCI of the RCA in 2014 with residual disease. He also had chest pain that correlated with heart rates in the 110-120 bpm on an event monitor in January 2016. Because of this I will increase his atenolol to 50 mg in the morning 20 5 in the evening. I will order a Lexi  scan Myoview to rule out ischemia. I will also place a 48 hour monitor on him to rule out uncontrolled atrial fibrillation. Follow-up with Dr.Koneswaran in 2 weeks. He is advised to call 911 or go to nearest emergency room if he has any prolonged chest pain. I'm also available to see him next week if needed. Patient is also scheduled for knee surgery in December. This will help enable [place] to clear him for surgery.      S/P knee replacement       Imaging: No results found.

## 2015-07-25 NOTE — Patient Instructions (Signed)

## 2015-07-25 NOTE — Progress Notes (Signed)
Cardiology Office Note   Date:  07/25/2015   ID:  Eddie Price, DOB 1940-10-25, MRN [id number]  PCP:  Vic Blackbird, MD  Cardiologist: Woodroe Chen, NP   Chief Complaint  Patient presents with  . Coronary Artery Disease  . Atrial Fibrillation      History of Present Illness: Eddie Price is a 75 y.o. male who presents for ongoing assessment and management of CAD, atrial fibrillation, on ELIQUIS, CHADS VASC Score of 4, drug-eluting stent to the ostial right coronary artery, and 06/17/2012, chronic angina, hypertension, hyperlipidemia.He was last seen in the office in April of 2017 after experiencing "fireworks feeling":in his chest. Was seen in ER and was not found to have acute changes in EKG. He was continued on current medical management.   He comes today feeling good. He continues to be active and work in his garden,. He denies racing HR or DOE. He denies bleeding issues or excessive bruising. He has had his physical with PCP yesterday. Labs were drawn.   Past Medical History  Diagnosis Date  . Hypertension   . Hyperlipidemia   . Depression   . History of adenomatous polyp of colon     2009  . Atrial fibrillation [facility])     new on-set Jan 2016--  with good rate control on atenolol  per cardiologist note Dr Bronson Ing 04-09-2014  . S/P drug eluting coronary stent placement     RCA x1  06-17-2012  . CAD, multiple vessel cardiologist--  dr Bronson Ing    3 vessel CAD //  s/p DES x1 to ostial RCA 06-17-2012  . Anticoagulant long-term use   . Acute meniscal tear of left knee   . OA (osteoarthritis) of knee   . At risk for sleep apnea     STOP-BANG= 4       SENT TO PCP 05-04-2014  . Anginal pain ([place])     comes and goes; uses rest to relieve   . Dysrhythmia   . Shortness of breath dyspnea     with exercise   . Anxiety     Past Surgical History  Procedure Laterality Date  . Percutaneous coronary stent intervention (pci-s) N/A 06/17/2012    Procedure:  PERCUTANEOUS CORONARY STENT INTERVENTION (PCI-S);  Surgeon: Burnell Blanks, MD;  Location: [facility] CATH LAB;  Service: Cardiovascular;  Laterality: N/A;  DES x1 ostial RCA  . Knee arthroscopy Right 2005  (approx)  . Left elbow surgery  1969  . Total hip arthroplasty Right 05-25-2006  . Colonoscopy  last one 07-01-2010  . Transthoracic echocardiogram  02-08-2014    mild focal basal hypertrophy of the septum, ef 55-60%,  indeterminate diastolic function in setting of atrial fib./  possibly bicuspid AV, mild calcification without stenosis, trivial AR/  mild MR and PR/  moderate to severe LAE/  trivial PR/  moderate RAE  . Cardiac catheterization  06-15-2012   dr Angelena Form    Triple vessel CAD with severe ostial RCA stenosis, severe stenosis in the small caliber diagonal branch and small caliber OM1, OM2 branches/  preserved LVSF  . Tonsillectomy  1970  . Shoulder arthroscopy with rotator cuff repair Bilateral x2 left//   x1 right (last one , left in 2001)  . Laparoscopic cholecystectomy  2011    AND REPAIR UMBILICAL HERNIA  . Vasectomy  1970'sj  w/  general anesthesia  . Knee arthroscopy Left 05/08/2014    Procedure: LEFT ARTHROSCOPY KNEE WIGHT DEBRIDEMENT PARTIAL LATERAL MENISCECTOMY AND CHONDROPLASTY;  Surgeon: Sydnee Cabal, MD;  Location: [facility];  Service: Orthopedics;  Laterality: Left;  . Trauma to left finger      partially amputation secondary to lawnmower accident   . Total knee arthroplasty Left 01/03/2015    Procedure: TOTAL KNEE ARTHROPLASTY;  Surgeon: Sydnee Cabal, MD;  Location: [facility] ORS;  Service: Orthopedics;  Laterality: Left;     Current Outpatient Prescriptions  Medication Sig Dispense Refill  . aspirin EC 81 MG tablet Take 81 mg by mouth daily.    Marland Kitchen atenolol (TENORMIN) 50 MG tablet TAKE ONE TABLET BY MOUTH ONCE DAILY 90 tablet 0  . atorvastatin (LIPITOR) 10 MG tablet Take 1 tablet (10 mg total) by mouth daily. 90 tablet 3  . ELIQUIS 5 MG TABS tablet  TAKE ONE TABLET BY MOUTH TWICE DAILY 180 tablet 3  . Multiple Vitamin (MULTIVITAMIN) capsule Take 1 capsule by mouth daily.    . nitroGLYCERIN (NITROSTAT) 0.4 MG SL tablet Place 1 tablet (0.4 mg total) under the tongue every 5 (five) minutes x 3 doses as needed for chest pain. 30 tablet 3  . Omega 3 1000 MG CAPS Take 2,000 mg by mouth 2 (two) times daily.    . sertraline (ZOLOFT) 100 MG tablet TAKE ONE TABLET BY MOUTH DAILY (Patient taking differently: TAKE ONE half TABLET (50mg )  BY MOUTH DAILY) 90 tablet 1  . vitamin E (VITAMIN E) 400 UNIT capsule Take 800 Units by mouth daily.     No current facility-administered medications for this visit.    Allergies:   Sulfa antibiotics    Social History:  The patient  reports that he quit smoking about 47 years ago. His smoking use included Cigarettes. He started smoking about 56 years ago. He has a 4 pack-year smoking history. He quit smokeless tobacco use about 45 years ago. His smokeless tobacco use included Chew. He reports that he does not drink alcohol or use illicit drugs.   Family History:  The patient's family history includes COPD in his mother; Cancer in his maternal grandmother; Depression in his mother; Heart attack in his father; Heart disease in his father; Heart failure in his father and mother; Hyperlipidemia in his father; Mental illness in his mother; Stroke in his father.    ROS: All other systems are reviewed and negative. Unless otherwise mentioned in H&P    PHYSICAL EXAM: VS:  BP 110/62 mmHg  Pulse 56  Ht 5\' 6"  (1.676 m)  Wt 231 lb (104.781 kg)  BMI 37.30 kg/m2  SpO2 98% , BMI Body mass index is 37.3 kg/(m^2). GEN: Well nourished, well developed, in no acute distress HEENT: normal Neck: no JVD, carotid bruits, or masses Cardiac: IRRR; no murmurs, rubs, or gallops,no edema  Respiratory:  clear to auscultation bilaterally, normal work of breathing GI: soft, nontender, nondistended, + BS MS: no deformity or atrophy Skin:  warm and dry, no rash Neuro:  Strength and sensation are intact Psych: euthymic mood, full affect  Recent Labs: 07/24/2015: ALT 16; BUN 11; Creat 1.06; Hemoglobin 14.9; Platelets 219; Potassium 5.2; Sodium 142    Lipid Panel    Component Value Date/Time   CHOL 82* 07/24/2015 0925   TRIG 94 07/24/2015 0925   HDL 28* 07/24/2015 0925   CHOLHDL 2.9 07/24/2015 0925   VLDL 19 07/24/2015 0925   LDLCALC 35 07/24/2015 0925      Wt Readings from Last 3 Encounters:  07/25/15 231 lb (104.781 kg)  07/24/15 232 lb (105.235 kg)  06/21/15  230 lb (104.327 kg)      Other studies Reviewed: Additional studies/ records that were reviewed today include: Labs from PCP Review of the above records demonstrates: All WNL   ASSESSMENT AND PLAN:  1.  Atrial fib: He is doing well and without complaints of chest pain or rapid HR. Continue Eliquis as directed. Just had labs drawn by his PCP. All were WNL. No changes in his regimen.   2. Hypertension: Currently well controlled. No changes in regimen.   Current medicines are reviewed at length with the patient today.    Labs/ tests ordered today include:  No orders of the defined types were placed in this encounter.     Disposition:   FU with 6 months   Signed, Jory Sims, NP  07/25/2015 3:01 PM    [facility] [address], [place], [place] [address] Phone: [phone]; Fax: [phone]

## 2015-07-25 NOTE — Assessment & Plan Note (Signed)
Well controlled, no change to meds 

## 2015-08-20 DIAGNOSIS — Z471 Aftercare following joint replacement surgery: Secondary | ICD-10-CM | POA: Diagnosis not present

## 2015-08-20 DIAGNOSIS — Z96652 Presence of left artificial knee joint: Secondary | ICD-10-CM | POA: Diagnosis not present

## 2015-09-13 ENCOUNTER — Other Ambulatory Visit: Payer: Self-pay | Admitting: Family Medicine

## 2015-09-13 NOTE — Telephone Encounter (Signed)
Refill appropriate and filled per protocol. 

## 2015-10-10 ENCOUNTER — Telehealth: Payer: Self-pay | Admitting: *Deleted

## 2015-10-10 NOTE — Telephone Encounter (Signed)
Received call from patient.   Reports that he is supposed to be set up with [facility] for colonoscopy, but has not heard anything.   Please contact patient with update at [phone].

## 2015-10-16 DIAGNOSIS — Z96652 Presence of left artificial knee joint: Secondary | ICD-10-CM | POA: Diagnosis not present

## 2015-10-16 DIAGNOSIS — Z471 Aftercare following joint replacement surgery: Secondary | ICD-10-CM | POA: Diagnosis not present

## 2015-11-18 ENCOUNTER — Telehealth: Payer: Self-pay | Admitting: General Practice

## 2015-11-18 NOTE — Telephone Encounter (Signed)
Patient called in stating he is ready to schedule his tcs, however he's taking Elquis and his last stent was placed 12 months ago  Routing to [place] to see if it's ok to triage, patient is not having any GI problems   Patient can be reached at [phone]

## 2015-11-21 NOTE — Telephone Encounter (Signed)
LMOM for pt that since he is on Eliquis he will need an OV first. He can schedule the appointment with whomever takes the call if I am with a patient.

## 2015-11-27 DIAGNOSIS — H524 Presbyopia: Secondary | ICD-10-CM | POA: Diagnosis not present

## 2015-11-27 DIAGNOSIS — H25813 Combined forms of age-related cataract, bilateral: Secondary | ICD-10-CM | POA: Diagnosis not present

## 2015-12-04 ENCOUNTER — Ambulatory Visit: Payer: Self-pay | Admitting: Nurse Practitioner

## 2015-12-09 ENCOUNTER — Encounter: Payer: Self-pay | Admitting: Gastroenterology

## 2015-12-09 ENCOUNTER — Other Ambulatory Visit: Payer: Self-pay

## 2015-12-09 ENCOUNTER — Ambulatory Visit (INDEPENDENT_AMBULATORY_CARE_PROVIDER_SITE_OTHER): Payer: Medicare Other | Admitting: Gastroenterology

## 2015-12-09 DIAGNOSIS — Z86010 Personal history of colonic polyps: Secondary | ICD-10-CM

## 2015-12-09 DIAGNOSIS — Z7901 Long term (current) use of anticoagulants: Secondary | ICD-10-CM | POA: Diagnosis not present

## 2015-12-09 MED ORDER — PEG 3350-KCL-NA BICARB-NACL 420 G PO SOLR: 4000.0000 mL | ORAL | 0 refills | Status: DC

## 2015-12-09 NOTE — Patient Instructions (Signed)
1. Colonoscopy as scheduled. Please see separate instructions. 

## 2015-12-09 NOTE — Assessment & Plan Note (Signed)
75 year old gentleman with history of colon polyps, due for 5 year surveillance colonoscopy. Last colonoscopy 06/2010, in [facility]. Patient is on Eliquis for A. fib. Will hold 48 hours prior procedure. Plan on colonoscopy in near future. I have discussed the risks, alternatives, benefits with regards to but not limited to the risk of reaction to medication, bleeding, infection, perforation and the patient is agreeable to proceed. Written consent to be obtained.  Instructions to resume Eliquis to be provided after his colonoscopy.

## 2015-12-09 NOTE — Progress Notes (Signed)
Primary Care Physician:  Vic Blackbird, MD  Primary Gastroenterologist:  Garfield Cornea, MD   Chief Complaint  Patient presents with  . Colonoscopy    HPI:  Eddie Price is a 75 y.o. male here to schedule colonoscopy. His last colonoscopy was June 2012 by Dr. Madolyn Frieze in [facility]. Patient has a personal history of colon polyps. Last colonoscopy showed mild diverticulosis of the left colon, small internal hemorrhoids but otherwise unremarkable. Recommend have a 5 year follow-up colonoscopy given prior history of polyps.  Patient takes Elquis for history of A. Fib. Last cardiac stent placed in 2014. GI standpoint he does well. Bowel movements are regular. No blood in the stool or melena. Denies abdominal pain. Appetite is good. No unintentional weight loss. No heartburn, dysphagia. No family history of colon cancer.  Current Outpatient Prescriptions  Medication Sig Dispense Refill  . aspirin EC 81 MG tablet Take 81 mg by mouth daily.    Marland Kitchen atenolol (TENORMIN) 50 MG tablet TAKE ONE TABLET BY MOUTH ONCE DAILY 90 tablet 0  . atorvastatin (LIPITOR) 10 MG tablet Take 1 tablet (10 mg total) by mouth daily. 90 tablet 3  . ELIQUIS 5 MG TABS tablet TAKE ONE TABLET BY MOUTH TWICE DAILY 180 tablet 3  . Multiple Vitamin (MULTIVITAMIN) capsule Take 1 capsule by mouth daily.    . nitroGLYCERIN (NITROSTAT) 0.4 MG SL tablet Place 1 tablet (0.4 mg total) under the tongue every 5 (five) minutes x 3 doses as needed for chest pain. 30 tablet 3  . Omega 3 1000 MG CAPS Take 2,000 mg by mouth 2 (two) times daily.    . sertraline (ZOLOFT) 100 MG tablet TAKE ONE TABLET BY MOUTH ONCE DAILY 90 tablet 1  . vitamin E (VITAMIN E) 400 UNIT capsule Take 800 Units by mouth daily.     No current facility-administered medications for this visit.     Allergies as of 12/09/2015 - Review Complete 12/09/2015  Allergen Reaction Noted  . Sulfa antibiotics Rash 06/13/2012    Past Medical History:  Diagnosis Date  .  Acute meniscal tear of left knee   . Anginal pain ([place])    comes and goes; uses rest to relieve   . Anticoagulant long-term use   . Anxiety   . At risk for sleep apnea    STOP-BANG= 4       SENT TO PCP 05-04-2014  . Atrial fibrillation [facility])    new on-set Jan 2016--  with good rate control on atenolol  per cardiologist note Dr Bronson Ing 04-09-2014  . CAD, multiple vessel cardiologist--  dr Bronson Ing   3 vessel CAD //  s/p DES x1 to ostial RCA 06-17-2012  . Depression   . Dysrhythmia   . History of adenomatous polyp of colon    2009  . Hyperlipidemia   . Hypertension   . OA (osteoarthritis) of knee   . S/P drug eluting coronary stent placement    RCA x1  06-17-2012  . Shortness of breath dyspnea    with exercise     Past Surgical History:  Procedure Laterality Date  . CARDIAC CATHETERIZATION  06-15-2012   dr Angelena Form   Triple vessel CAD with severe ostial RCA stenosis, severe stenosis in the small caliber diagonal branch and small caliber OM1, OM2 branches/  preserved LVSF  . COLONOSCOPY  last one 07-01-2010   Dr. Madolyn Frieze. mild diveticulosis of left colon, small internal hemorrhoids. next tcs in 5 years due to h/o colon  polyps.  Marland Kitchen KNEE ARTHROSCOPY Right 2005  (approx)  . KNEE ARTHROSCOPY Left 05/08/2014   Procedure: LEFT ARTHROSCOPY KNEE WIGHT DEBRIDEMENT PARTIAL LATERAL MENISCECTOMY AND CHONDROPLASTY;  Surgeon: Sydnee Cabal, MD;  Location: [place];  Service: Orthopedics;  Laterality: Left;  . LAPAROSCOPIC CHOLECYSTECTOMY  2011   AND REPAIR UMBILICAL HERNIA  . LEFT ELBOW SURGERY  1969  . PERCUTANEOUS CORONARY STENT INTERVENTION (PCI-S) N/A 06/17/2012   Procedure: PERCUTANEOUS CORONARY STENT INTERVENTION (PCI-S);  Surgeon: Burnell Blanks, MD;  Location: [facility] CATH LAB;  Service: Cardiovascular;  Laterality: N/A;  DES x1 ostial RCA  . SHOULDER ARTHROSCOPY WITH ROTATOR CUFF REPAIR Bilateral x2 left//   x1 right (last one , left in 2001)  .  TONSILLECTOMY  1970  . TOTAL HIP ARTHROPLASTY Right 05-25-2006  . TOTAL KNEE ARTHROPLASTY Left 01/03/2015   Procedure: TOTAL KNEE ARTHROPLASTY;  Surgeon: Sydnee Cabal, MD;  Location: [facility];  Service: Orthopedics;  Laterality: Left;  . TRANSTHORACIC ECHOCARDIOGRAM  02-08-2014   mild focal basal hypertrophy of the septum, ef 55-60%,  indeterminate diastolic function in setting of atrial fib./  possibly bicuspid AV, mild calcification without stenosis, trivial AR/  mild MR and PR/  moderate to severe LAE/  trivial PR/  moderate RAE  . trauma to left finger     partially amputation secondary to lawnmower accident   . VASECTOMY  1970'sj  w/  general anesthesia    Family History  Problem Relation Age of Onset  . Heart failure Mother   . COPD Mother   . Depression Mother   . Mental illness Mother   . Heart failure Father   . Stroke Father   . Heart attack Father   . Heart disease Father   . Hyperlipidemia Father   . Cancer Maternal Grandmother     cervical or ovarian  . Colon cancer Neg Hx     Social History   Social History  . Marital status: Married    Spouse name: N/A  . Number of children: N/A  . Years of education: N/A   Occupational History  . Not on file.   Social History Main Topics  . Smoking status: Former Smoker    Packs/day: 0.50    Years: 8.00    Types: Cigarettes    Start date: 02/02/1959    Quit date: 01/27/1968  . Smokeless tobacco: Former Systems developer    Types: Chew    Quit date: 01/26/1970  . Alcohol use No     Comment: RARE  . Drug use: No  . Sexual activity: Not on file   Other Topics Concern  . Not on file   Social History Narrative   [employer], retired      ROS:  General: Negative for anorexia, weight loss, fever, chills, fatigue, weakness. Eyes: Negative for vision changes.  ENT: Negative for hoarseness, difficulty swallowing , nasal congestion. CV: Negative for chest pain, angina, palpitations, dyspnea on exertion, peripheral edema.   Respiratory: Negative for dyspnea at rest, dyspnea on exertion, cough, sputum, wheezing.  GI: See history of present illness. GU:  Negative for dysuria, hematuria, urinary incontinence, urinary frequency, nocturnal urination.  MS: Negative for joint pain, low back pain.  Derm: Negative for rash or itching.  Neuro: Negative for weakness, abnormal sensation, seizure, frequent headaches, memory loss, confusion.  Psych: Negative for anxiety, depression, suicidal ideation, hallucinations.  Endo: Negative for unusual weight change.  Heme: Negative for bruising or bleeding. Allergy: Negative for rash or hives.  Physical Examination:  BP 122/76   Pulse 68   Temp 97.6 F (36.4 C) (Oral)   Ht 5\' 11"  (1.803 m)   Wt 232 lb 9.6 oz (105.5 kg)   BMI 32.44 kg/m    General: Well-nourished, well-developed in no acute distress.  Head: Normocephalic, atraumatic.   Eyes: Conjunctiva pink, no icterus. Mouth: Oropharyngeal mucosa moist and pink , no lesions erythema or exudate. Neck: Supple without thyromegaly, masses, or lymphadenopathy.  Lungs: Clear to auscultation bilaterally.  Heart: Regular rate and rhythm, no murmurs rubs or gallops.  Abdomen: Bowel sounds are normal, nontender, nondistended, no hepatosplenomegaly or masses, no abdominal bruits or    hernia , no rebound or guarding.   Rectal: Deferred Extremities: No lower extremity edema. No clubbing or deformities.  Neuro: Alert and oriented x 4 , grossly normal neurologically.  Skin: Warm and dry, no rash or jaundice.   Psych: Alert and cooperative, normal mood and affect.  Labs: Lab Results  Component Value Date   CREATININE 1.06 07/24/2015   BUN 11 07/24/2015   NA 142 07/24/2015   K 5.2 07/24/2015   CL 105 07/24/2015   CO2 24 07/24/2015   Lab Results  Component Value Date   ALT 16 07/24/2015   AST 17 07/24/2015   ALKPHOS 43 07/24/2015   BILITOT 1.0 07/24/2015   Lab Results  Component Value Date   WBC 5.5 07/24/2015    HGB 14.9 07/24/2015   HCT 45.0 07/24/2015   MCV 97.6 07/24/2015   PLT 219 07/24/2015     Imaging Studies: No results found.

## 2015-12-09 NOTE — Patient Instructions (Signed)
PA# for TCS: [id number]

## 2015-12-10 NOTE — Progress Notes (Signed)
cc'd to pcp 

## 2015-12-18 ENCOUNTER — Ambulatory Visit (HOSPITAL_COMMUNITY)
Admission: RE | Admit: 2015-12-18 | Discharge: 2015-12-18 | Disposition: A | Discharge status: Home / Self Care | Payer: Medicare Other | Source: Ambulatory Visit | Attending: Internal Medicine | Admitting: Internal Medicine

## 2015-12-18 ENCOUNTER — Encounter (HOSPITAL_COMMUNITY): Payer: Self-pay | Admitting: *Deleted

## 2015-12-18 ENCOUNTER — Encounter (HOSPITAL_COMMUNITY)
Admission: RE | Disposition: A | Discharge status: Home / Self Care | Payer: Self-pay | Source: Ambulatory Visit | Attending: Internal Medicine

## 2015-12-18 DIAGNOSIS — F329 Major depressive disorder, single episode, unspecified: Secondary | ICD-10-CM | POA: Diagnosis not present

## 2015-12-18 DIAGNOSIS — Z79899 Other long term (current) drug therapy: Secondary | ICD-10-CM | POA: Diagnosis not present

## 2015-12-18 DIAGNOSIS — Z87891 Personal history of nicotine dependence: Secondary | ICD-10-CM | POA: Insufficient documentation

## 2015-12-18 DIAGNOSIS — K5730 Diverticulosis of large intestine without perforation or abscess without bleeding: Secondary | ICD-10-CM | POA: Insufficient documentation

## 2015-12-18 DIAGNOSIS — E785 Hyperlipidemia, unspecified: Secondary | ICD-10-CM | POA: Diagnosis not present

## 2015-12-18 DIAGNOSIS — M1990 Unspecified osteoarthritis, unspecified site: Secondary | ICD-10-CM | POA: Diagnosis not present

## 2015-12-18 DIAGNOSIS — Z7901 Long term (current) use of anticoagulants: Secondary | ICD-10-CM | POA: Diagnosis not present

## 2015-12-18 DIAGNOSIS — Z1211 Encounter for screening for malignant neoplasm of colon: Secondary | ICD-10-CM | POA: Diagnosis not present

## 2015-12-18 DIAGNOSIS — F419 Anxiety disorder, unspecified: Secondary | ICD-10-CM | POA: Insufficient documentation

## 2015-12-18 DIAGNOSIS — I2510 Atherosclerotic heart disease of native coronary artery without angina pectoris: Secondary | ICD-10-CM | POA: Insufficient documentation

## 2015-12-18 DIAGNOSIS — I4891 Unspecified atrial fibrillation: Secondary | ICD-10-CM | POA: Diagnosis not present

## 2015-12-18 DIAGNOSIS — Z955 Presence of coronary angioplasty implant and graft: Secondary | ICD-10-CM | POA: Insufficient documentation

## 2015-12-18 DIAGNOSIS — I10 Essential (primary) hypertension: Secondary | ICD-10-CM | POA: Diagnosis not present

## 2015-12-18 DIAGNOSIS — D128 Benign neoplasm of rectum: Secondary | ICD-10-CM | POA: Diagnosis not present

## 2015-12-18 DIAGNOSIS — Z86010 Personal history of colonic polyps: Secondary | ICD-10-CM

## 2015-12-18 DIAGNOSIS — Z7982 Long term (current) use of aspirin: Secondary | ICD-10-CM | POA: Insufficient documentation

## 2015-12-18 HISTORY — PX: COLONOSCOPY: SHX5424

## 2015-12-18 SURGERY — COLONOSCOPY
Anesthesia: Moderate Sedation

## 2015-12-18 MED ORDER — MIDAZOLAM HCL 5 MG/5ML IJ SOLN
INTRAMUSCULAR | Status: AC
  Filled 2015-12-18: qty 10

## 2015-12-18 MED ORDER — SODIUM CHLORIDE 0.9 % IV SOLN
INTRAVENOUS | Status: DC
  Administered 2015-12-18: 13:00:00 via INTRAVENOUS

## 2015-12-18 MED ORDER — STERILE WATER FOR IRRIGATION IR SOLN
Status: DC | PRN
  Administered 2015-12-18: 13:00:00

## 2015-12-18 MED ORDER — MEPERIDINE HCL 100 MG/ML IJ SOLN
INTRAMUSCULAR | Status: AC
  Filled 2015-12-18: qty 2

## 2015-12-18 MED ORDER — ONDANSETRON HCL 4 MG/2ML IJ SOLN
INTRAMUSCULAR | Status: AC
  Filled 2015-12-18: qty 2

## 2015-12-18 MED ORDER — MEPERIDINE HCL 100 MG/ML IJ SOLN
INTRAMUSCULAR | Status: DC | PRN
  Administered 2015-12-18: 25 mg via INTRAVENOUS
  Administered 2015-12-18: 50 mg via INTRAVENOUS

## 2015-12-18 MED ORDER — ONDANSETRON HCL 4 MG/2ML IJ SOLN
INTRAMUSCULAR | Status: DC | PRN
  Administered 2015-12-18: 4 mg via INTRAVENOUS

## 2015-12-18 MED ORDER — MIDAZOLAM HCL 5 MG/5ML IJ SOLN
INTRAMUSCULAR | Status: DC | PRN
  Administered 2015-12-18: 1 mg via INTRAVENOUS
  Administered 2015-12-18: 2 mg via INTRAVENOUS

## 2015-12-18 NOTE — Op Note (Signed)
[facility] Patient Name: Eddie Price Procedure Date: 12/18/2015 1:22 PM MRN: [id number] Date of Birth: 03-18-1940 Attending MD: Norvel Richards , MD CSN: [id number] Age: 75 Admit Type: Outpatient Procedure:                Colonoscopy with are polypectomy Indications:              High risk colon cancer surveillance: Personal                            history of colonic polyps Providers:                Norvel Richards, MD, Janeece Riggers, RN, Sherlyn Lees, Technician Referring MD:              Medicines:                Midazolam 3 mg IV, Meperidine 75 mg IV, Ondansetron                            4 mg IV Complications:            No immediate complications. Estimated Blood Loss:     Estimated blood loss: none. Procedure:                Pre-Anesthesia Assessment:                           - Prior to the procedure, a History and Physical                            was performed, and patient medications and                            allergies were reviewed. The patient's tolerance of                            previous anesthesia was also reviewed. The risks                            and benefits of the procedure and the sedation                            options and risks were discussed with the patient.                            All questions were answered, and informed consent                            was obtained. Prior Anticoagulants: The patient has                            taken no previous anticoagulant or antiplatelet  agents and last took Eliquis (apixaban) 2 days                            prior to the procedure. ASA Grade Assessment: II -                            A patient with mild systemic disease. After                            reviewing the risks and benefits, the patient was                            deemed in satisfactory condition to undergo the                            procedure.                         After obtaining informed consent, the colonoscope                            was passed under direct vision. Throughout the                            procedure, the patient's blood pressure, pulse, and                            oxygen saturations were monitored continuously. The                            [id number]) scope was introduced through                            the anus and advanced to the the cecum, identified                            by appendiceal orifice and ileocecal valve. The                            colonoscopy was performed without difficulty. The                            patient tolerated the procedure well. The quality                            of the bowel preparation was adequate. The                            ileocecal valve, appendiceal orifice, and rectum                            were photographed. The entire colon was well  visualized. Scope In: 1:37:08 PM Scope Out: 1:57:18 PM Scope Withdrawal Time: 0 hours 15 minutes 1 second  Total Procedure Duration: 0 hours 20 minutes 10 seconds  Findings:      The perianal and digital rectal examinations were normal.      Scattered small and large-mouthed diverticula were found in the sigmoid       colon.      A 9 mm polyp was found in the rectum. The polyp was semi-pedunculated.       The polyp was removed with a hot snare. Resection and retrieval were       complete. Estimated blood loss: none. Polypectomy site prophylactically       clipped with one Cook hemostasis clip.      The exam was otherwise without abnormality on direct and retroflexion       views. Impression:               - Diverticulosis in the sigmoid colon.                           - One 9 mm polyp in the rectum, removed with a hot                            snare. Resected and retrieved. Clip placed.                           - The examination was otherwise normal on direct                             and retroflexion views. Moderate Sedation:      Moderate (conscious) sedation was administered by the endoscopy nurse       and supervised by the endoscopist. The following parameters were       monitored: oxygen saturation, heart rate, blood pressure, respiratory       rate, EKG, adequacy of pulmonary ventilation, and response to care.       Total physician intraservice time was 30 minutes. Recommendation:           - Patient has a contact number available for                            emergencies. The signs and symptoms of potential                            delayed complications were discussed with the                            patient. Return to normal activities tomorrow.                            Written discharge instructions were provided to the                            patient.                           - Resume previous diet.                           -  Resume Eliquis (apixaban) at prior dose today.                           - Continue present medications. No future MRI until                            clip no longer present.                           - Repeat colonoscopy date to be determined after                            pending pathology results are reviewed for                            surveillance based on pathology results.                           - Return to GI office (date not yet determined). Procedure Code(s):        --- Professional ---                           [phone], Colonoscopy, flexible; with removal of                            tumor(s), polyp(s), or other lesion(s) by snare                            technique                           99152, Moderate sedation services provided by the                            same physician or other qualified health care                            professional performing the diagnostic or                            therapeutic service that the sedation supports,                             requiring the presence of an independent trained                            observer to assist in the monitoring of the                            patient's level of consciousness and physiological                            status; initial 15 minutes of intraservice time,  patient age 58 years or older                           [phone], Moderate sedation services; each additional                            15 minutes intraservice time Diagnosis Code(s):        --- Professional ---                           Z86.010, Personal history of colonic polyps                           K62.1, Rectal polyp                           K57.30, Diverticulosis of large intestine without                            perforation or abscess without bleeding CPT copyright 2016 American Medical Association. All rights reserved. The codes documented in this report are preliminary and upon coder review may  be revised to meet current compliance requirements. Cristopher Estimable. Yumiko Alkins, MD Norvel Richards, MD 12/18/2015 2:08:19 PM This report has been signed electronically. Number of Addenda: 0

## 2015-12-18 NOTE — H&P (View-Only) (Signed)
Primary Care Physician:  Vic Blackbird, MD  Primary Gastroenterologist:  Garfield Cornea, MD   Chief Complaint  Patient presents with  . Colonoscopy    HPI:  Eddie Price is a 75 y.o. male here to schedule colonoscopy. His last colonoscopy was June 2012 by Dr. Madolyn Frieze in [facility]. Patient has a personal history of colon polyps. Last colonoscopy showed mild diverticulosis of the left colon, small internal hemorrhoids but otherwise unremarkable. Recommend have a 5 year follow-up colonoscopy given prior history of polyps.  Patient takes Elquis for history of A. Fib. Last cardiac stent placed in 2014. GI standpoint he does well. Bowel movements are regular. No blood in the stool or melena. Denies abdominal pain. Appetite is good. No unintentional weight loss. No heartburn, dysphagia. No family history of colon cancer.  Current Outpatient Prescriptions  Medication Sig Dispense Refill  . aspirin EC 81 MG tablet Take 81 mg by mouth daily.    Marland Kitchen atenolol (TENORMIN) 50 MG tablet TAKE ONE TABLET BY MOUTH ONCE DAILY 90 tablet 0  . atorvastatin (LIPITOR) 10 MG tablet Take 1 tablet (10 mg total) by mouth daily. 90 tablet 3  . ELIQUIS 5 MG TABS tablet TAKE ONE TABLET BY MOUTH TWICE DAILY 180 tablet 3  . Multiple Vitamin (MULTIVITAMIN) capsule Take 1 capsule by mouth daily.    . nitroGLYCERIN (NITROSTAT) 0.4 MG SL tablet Place 1 tablet (0.4 mg total) under the tongue every 5 (five) minutes x 3 doses as needed for chest pain. 30 tablet 3  . Omega 3 1000 MG CAPS Take 2,000 mg by mouth 2 (two) times daily.    . sertraline (ZOLOFT) 100 MG tablet TAKE ONE TABLET BY MOUTH ONCE DAILY 90 tablet 1  . vitamin E (VITAMIN E) 400 UNIT capsule Take 800 Units by mouth daily.     No current facility-administered medications for this visit.     Allergies as of 12/09/2015 - Review Complete 12/09/2015  Allergen Reaction Noted  . Sulfa antibiotics Rash 06/13/2012    Past Medical History:  Diagnosis Date  .  Acute meniscal tear of left knee   . Anginal pain ([place])    comes and goes; uses rest to relieve   . Anticoagulant long-term use   . Anxiety   . At risk for sleep apnea    STOP-BANG= 4       SENT TO PCP 05-04-2014  . Atrial fibrillation [facility])    new on-set Jan 2016--  with good rate control on atenolol  per cardiologist note Dr Bronson Ing 04-09-2014  . CAD, multiple vessel cardiologist--  dr Bronson Ing   3 vessel CAD //  s/p DES x1 to ostial RCA 06-17-2012  . Depression   . Dysrhythmia   . History of adenomatous polyp of colon    2009  . Hyperlipidemia   . Hypertension   . OA (osteoarthritis) of knee   . S/P drug eluting coronary stent placement    RCA x1  06-17-2012  . Shortness of breath dyspnea    with exercise     Past Surgical History:  Procedure Laterality Date  . CARDIAC CATHETERIZATION  06-15-2012   dr Angelena Form   Triple vessel CAD with severe ostial RCA stenosis, severe stenosis in the small caliber diagonal branch and small caliber OM1, OM2 branches/  preserved LVSF  . COLONOSCOPY  last one 07-01-2010   Dr. Madolyn Frieze. mild diveticulosis of left colon, small internal hemorrhoids. next tcs in 5 years due to h/o colon  polyps.  Marland Kitchen KNEE ARTHROSCOPY Right 2005  (approx)  . KNEE ARTHROSCOPY Left 05/08/2014   Procedure: LEFT ARTHROSCOPY KNEE WIGHT DEBRIDEMENT PARTIAL LATERAL MENISCECTOMY AND CHONDROPLASTY;  Surgeon: Sydnee Cabal, MD;  Location: [place];  Service: Orthopedics;  Laterality: Left;  . LAPAROSCOPIC CHOLECYSTECTOMY  2011   AND REPAIR UMBILICAL HERNIA  . LEFT ELBOW SURGERY  1969  . PERCUTANEOUS CORONARY STENT INTERVENTION (PCI-S) N/A 06/17/2012   Procedure: PERCUTANEOUS CORONARY STENT INTERVENTION (PCI-S);  Surgeon: Burnell Blanks, MD;  Location: [facility] CATH LAB;  Service: Cardiovascular;  Laterality: N/A;  DES x1 ostial RCA  . SHOULDER ARTHROSCOPY WITH ROTATOR CUFF REPAIR Bilateral x2 left//   x1 right (last one , left in 2001)  .  TONSILLECTOMY  1970  . TOTAL HIP ARTHROPLASTY Right 05-25-2006  . TOTAL KNEE ARTHROPLASTY Left 01/03/2015   Procedure: TOTAL KNEE ARTHROPLASTY;  Surgeon: Sydnee Cabal, MD;  Location: [facility];  Service: Orthopedics;  Laterality: Left;  . TRANSTHORACIC ECHOCARDIOGRAM  02-08-2014   mild focal basal hypertrophy of the septum, ef 55-60%,  indeterminate diastolic function in setting of atrial fib./  possibly bicuspid AV, mild calcification without stenosis, trivial AR/  mild MR and PR/  moderate to severe LAE/  trivial PR/  moderate RAE  . trauma to left finger     partially amputation secondary to lawnmower accident   . VASECTOMY  1970'sj  w/  general anesthesia    Family History  Problem Relation Age of Onset  . Heart failure Mother   . COPD Mother   . Depression Mother   . Mental illness Mother   . Heart failure Father   . Stroke Father   . Heart attack Father   . Heart disease Father   . Hyperlipidemia Father   . Cancer Maternal Grandmother     cervical or ovarian  . Colon cancer Neg Hx     Social History   Social History  . Marital status: Married    Spouse name: N/A  . Number of children: N/A  . Years of education: N/A   Occupational History  . Not on file.   Social History Main Topics  . Smoking status: Former Smoker    Packs/day: 0.50    Years: 8.00    Types: Cigarettes    Start date: 02/02/1959    Quit date: 01/27/1968  . Smokeless tobacco: Former Systems developer    Types: Chew    Quit date: 01/26/1970  . Alcohol use No     Comment: RARE  . Drug use: No  . Sexual activity: Not on file   Other Topics Concern  . Not on file   Social History Narrative   [employer], retired      ROS:  General: Negative for anorexia, weight loss, fever, chills, fatigue, weakness. Eyes: Negative for vision changes.  ENT: Negative for hoarseness, difficulty swallowing , nasal congestion. CV: Negative for chest pain, angina, palpitations, dyspnea on exertion, peripheral edema.   Respiratory: Negative for dyspnea at rest, dyspnea on exertion, cough, sputum, wheezing.  GI: See history of present illness. GU:  Negative for dysuria, hematuria, urinary incontinence, urinary frequency, nocturnal urination.  MS: Negative for joint pain, low back pain.  Derm: Negative for rash or itching.  Neuro: Negative for weakness, abnormal sensation, seizure, frequent headaches, memory loss, confusion.  Psych: Negative for anxiety, depression, suicidal ideation, hallucinations.  Endo: Negative for unusual weight change.  Heme: Negative for bruising or bleeding. Allergy: Negative for rash or hives.  Physical Examination:  BP 122/76   Pulse 68   Temp 97.6 F (36.4 C) (Oral)   Ht 5\' 11"  (1.803 m)   Wt 232 lb 9.6 oz (105.5 kg)   BMI 32.44 kg/m    General: Well-nourished, well-developed in no acute distress.  Head: Normocephalic, atraumatic.   Eyes: Conjunctiva pink, no icterus. Mouth: Oropharyngeal mucosa moist and pink , no lesions erythema or exudate. Neck: Supple without thyromegaly, masses, or lymphadenopathy.  Lungs: Clear to auscultation bilaterally.  Heart: Regular rate and rhythm, no murmurs rubs or gallops.  Abdomen: Bowel sounds are normal, nontender, nondistended, no hepatosplenomegaly or masses, no abdominal bruits or    hernia , no rebound or guarding.   Rectal: Deferred Extremities: No lower extremity edema. No clubbing or deformities.  Neuro: Alert and oriented x 4 , grossly normal neurologically.  Skin: Warm and dry, no rash or jaundice.   Psych: Alert and cooperative, normal mood and affect.  Labs: Lab Results  Component Value Date   CREATININE 1.06 07/24/2015   BUN 11 07/24/2015   NA 142 07/24/2015   K 5.2 07/24/2015   CL 105 07/24/2015   CO2 24 07/24/2015   Lab Results  Component Value Date   ALT 16 07/24/2015   AST 17 07/24/2015   ALKPHOS 43 07/24/2015   BILITOT 1.0 07/24/2015   Lab Results  Component Value Date   WBC 5.5 07/24/2015    HGB 14.9 07/24/2015   HCT 45.0 07/24/2015   MCV 97.6 07/24/2015   PLT 219 07/24/2015     Imaging Studies: No results found.

## 2015-12-18 NOTE — Discharge Instructions (Signed)
Diverticulosis Diverticulosis is the condition that develops when small pouches (diverticula) form in the wall of your colon. Your colon, or large intestine, is where water is absorbed and stool is formed. The pouches form when the inside layer of your colon pushes through weak spots in the outer layers of your colon. CAUSES  No one knows exactly what causes diverticulosis. RISK FACTORS Being older than 58. Your risk for this condition increases with age. Diverticulosis is rare in people younger than 40 years. By age 82, almost everyone has it. Eating a low-fiber diet. Being frequently constipated. Being overweight. Not getting enough exercise. Smoking. Taking over-the-counter pain medicines, like aspirin and ibuprofen. SYMPTOMS  Most people with diverticulosis do not have symptoms. DIAGNOSIS  Because diverticulosis often has no symptoms, health care providers often discover the condition during an exam for other colon problems. In many cases, a health care provider will diagnose diverticulosis while using a flexible scope to examine the colon (colonoscopy). TREATMENT  If you have never developed an infection related to diverticulosis, you may not need treatment. If you have had an infection before, treatment may include: Eating more fruits, vegetables, and grains. Taking a fiber supplement. Taking a live bacteria supplement (probiotic). Taking medicine to relax your colon. HOME CARE INSTRUCTIONS  Drink at least 6-8 glasses of water each day to prevent constipation. Try not to strain when you have a bowel movement. Keep all follow-up appointments. If you have had an infection before: Increase the fiber in your diet as directed by your health care provider or dietitian. Take a dietary fiber supplement if your health care provider approves. Only take medicines as directed by your health care provider. SEEK MEDICAL CARE IF:  You have abdominal pain. You have bloating. You have  cramps. You have not gone to the bathroom in 3 days. SEEK IMMEDIATE MEDICAL CARE IF:  Your pain gets worse. Yourbloating becomes very bad. You have a fever or chills, and your symptoms suddenly get worse. You begin vomiting. You have bowel movements that are bloody or black. MAKE SURE YOU: Understand these instructions. Will watch your condition. Will get help right away if you are not doing well or get worse. This information is not intended to replace advice given to you by your health care provider. Make sure you discuss any questions you have with your health care provider. Document Released: 10/10/2003 Document Revised: 01/17/2013 Document Reviewed: 12/07/2012 Elsevier Interactive Patient Education  2017 [place]. Colon Polyps Introduction Polyps are tissue growths inside the body. Polyps can grow in many places, including the large intestine (colon). A polyp may be a round bump or a mushroom-shaped growth. You could have one polyp or several. Most colon polyps are noncancerous (benign). However, some colon polyps can become cancerous over time. What are the causes? The exact cause of colon polyps is not known. What increases the risk? This condition is more likely to develop in people who: Have a family history of colon cancer or colon polyps. Are older than 28 or older than 45 if they are African American. Have inflammatory bowel disease, such as ulcerative colitis or Crohn disease. Are overweight. Smoke cigarettes. Do not get enough exercise. Drink too much alcohol. Eat a diet that is: High in fat and red meat. Low in fiber. Had childhood cancer that was treated with abdominal radiation. What are the signs or symptoms? Most polyps do not cause symptoms. If you have symptoms, they may include: Blood coming from your rectum when having a  bowel movement. Blood in your stool.The stool may look dark red or black. A change in bowel habits, such as constipation or  diarrhea. How is this diagnosed? This condition is diagnosed with a colonoscopy. This is a procedure that uses a lighted, flexible scope to look at the inside of your colon. How is this treated? Treatment for this condition involves removing any polyps that are found. Those polyps will then be tested for cancer. If cancer is found, your health care provider will talk to you about options for colon cancer treatment. Follow these instructions at home: Diet Eat plenty of fiber, such as fruits, vegetables, and whole grains. Eat foods that are high in calcium and vitamin D, such as milk, cheese, yogurt, eggs, liver, fish, and broccoli. Limit foods high in fat, red meats, and processed meats, such as hot dogs, sausage, bacon, and lunch meats. Maintain a healthy weight, or lose weight if recommended by your health care provider. General instructions Do not smoke cigarettes. Do not drink alcohol excessively. Keep all follow-up visits as told by your health care provider. This is important. This includes keeping regularly scheduled colonoscopies. Talk to your health care provider about when you need a colonoscopy. Exercise every day or as told by your health care provider. Contact a health care provider if: You have new or worsening bleeding during a bowel movement. You have new or increased blood in your stool. You have a change in bowel habits. You unexpectedly lose weight. This information is not intended to replace advice given to you by your health care provider. Make sure you discuss any questions you have with your health care provider. Document Released: 10/09/2003 Document Revised: 06/20/2015 Document Reviewed: 12/03/2014  2017 Elsevier  Colonoscopy Discharge Instructions  Read the instructions outlined below and refer to this sheet in the next few weeks. These discharge instructions provide you with general information on caring for yourself after you leave the hospital. Your doctor may  also give you specific instructions. While your treatment has been planned according to the most current medical practices available, unavoidable complications occasionally occur. If you have any problems or questions after discharge, call Dr. Gala Romney at [phone]. ACTIVITY  You may resume your regular activity, but move at a slower pace for the next 24 hours.   Take frequent rest periods for the next 24 hours.   Walking will help get rid of the air and reduce the bloated feeling in your belly (abdomen).   No driving for 24 hours (because of the medicine (anesthesia) used during the test).    Do not sign any important legal documents or operate any machinery for 24 hours (because of the anesthesia used during the test).  NUTRITION  Drink plenty of fluids.   You may resume your normal diet as instructed by your doctor.   Begin with a light meal and progress to your normal diet. Heavy or fried foods are harder to digest and may make you feel sick to your stomach (nauseated).   Avoid alcoholic beverages for 24 hours or as instructed.  MEDICATIONS  You may resume your normal medications unless your doctor tells you otherwise.  WHAT YOU CAN EXPECT TODAY  Some feelings of bloating in the abdomen.   Passage of more gas than usual.   Spotting of blood in your stool or on the toilet paper.  IF YOU HAD POLYPS REMOVED DURING THE COLONOSCOPY:  No aspirin products for 7 days or as instructed.   No alcohol for 7  days or as instructed.   Eat a soft diet for the next 24 hours.  FINDING OUT THE RESULTS OF YOUR TEST Not all test results are available during your visit. If your test results are not back during the visit, make an appointment with your caregiver to find out the results. Do not assume everything is normal if you have not heard from your caregiver or the medical facility. It is important for you to follow up on all of your test results.  SEEK IMMEDIATE MEDICAL ATTENTION IF:  You have  more than a spotting of blood in your stool.   Your belly is swollen (abdominal distention).   You are nauseated or vomiting.   You have a temperature over 101.   You have abdominal pain or discomfort that is severe or gets worse throughout the day.    Colon polyp and diverticulosis information provided  Further recommendations to follow pending review of pathology report  Resume Eliquis tomorrow  No future MRI until clip has passed

## 2015-12-18 NOTE — Interval H&P Note (Signed)
History and Physical Interval Note:  12/18/2015 1:26 PM  Eddie Price  has presented today for surgery, with the diagnosis of history of colon polyps  The various methods of treatment have been discussed with the patient and family. After consideration of risks, benefits and other options for treatment, the patient has consented to  Procedure(s) with comments: COLONOSCOPY (N/A) - 1:30 pm as a surgical intervention .  The patient's history has been reviewed, patient examined, no change in status, stable for surgery.  I have reviewed the patient's chart and labs.  Questions were answered to the patient's satisfaction.     Surveillance colonoscopy per plan. Eliquis held x 2days.  The risks, benefits, limitations, alternatives and imponderables have been reviewed with the patient. Questions have been answered. All parties are agreeable.   Manus Rudd

## 2015-12-21 DIAGNOSIS — J988 Other specified respiratory disorders: Secondary | ICD-10-CM | POA: Diagnosis not present

## 2015-12-21 DIAGNOSIS — J029 Acute pharyngitis, unspecified: Secondary | ICD-10-CM | POA: Diagnosis not present

## 2015-12-26 ENCOUNTER — Encounter (HOSPITAL_COMMUNITY): Payer: Self-pay | Admitting: Internal Medicine

## 2015-12-26 ENCOUNTER — Encounter: Payer: Self-pay | Admitting: Internal Medicine

## 2016-01-02 ENCOUNTER — Encounter: Payer: Self-pay | Admitting: Family Medicine

## 2016-01-11 ENCOUNTER — Other Ambulatory Visit: Payer: Self-pay | Admitting: Family Medicine

## 2016-01-19 ENCOUNTER — Emergency Department (HOSPITAL_COMMUNITY): Payer: Medicare Other

## 2016-01-19 ENCOUNTER — Emergency Department (HOSPITAL_COMMUNITY)
Admission: EM | Admit: 2016-01-19 | Discharge: 2016-01-19 | Disposition: A | Discharge status: Home / Self Care | Payer: Medicare Other | Attending: Emergency Medicine | Admitting: Emergency Medicine

## 2016-01-19 ENCOUNTER — Encounter (HOSPITAL_COMMUNITY): Payer: Self-pay | Admitting: *Deleted

## 2016-01-19 DIAGNOSIS — R197 Diarrhea, unspecified: Secondary | ICD-10-CM | POA: Diagnosis not present

## 2016-01-19 DIAGNOSIS — I10 Essential (primary) hypertension: Secondary | ICD-10-CM | POA: Insufficient documentation

## 2016-01-19 DIAGNOSIS — Z79899 Other long term (current) drug therapy: Secondary | ICD-10-CM | POA: Insufficient documentation

## 2016-01-19 DIAGNOSIS — Z87891 Personal history of nicotine dependence: Secondary | ICD-10-CM | POA: Diagnosis not present

## 2016-01-19 DIAGNOSIS — Z7982 Long term (current) use of aspirin: Secondary | ICD-10-CM | POA: Diagnosis not present

## 2016-01-19 DIAGNOSIS — R112 Nausea with vomiting, unspecified: Secondary | ICD-10-CM

## 2016-01-19 DIAGNOSIS — R509 Fever, unspecified: Secondary | ICD-10-CM | POA: Insufficient documentation

## 2016-01-19 DIAGNOSIS — I2510 Atherosclerotic heart disease of native coronary artery without angina pectoris: Secondary | ICD-10-CM | POA: Insufficient documentation

## 2016-01-19 DIAGNOSIS — R05 Cough: Secondary | ICD-10-CM | POA: Diagnosis not present

## 2016-01-19 LAB — COMPREHENSIVE METABOLIC PANEL
ALBUMIN: 4 g/dL (ref 3.5–5.0)
ALT: 22 U/L (ref 17–63)
ANION GAP: 10 (ref 5–15)
AST: 20 U/L (ref 15–41)
Alkaline Phosphatase: 39 U/L (ref 38–126)
BUN: 18 mg/dL (ref 6–20)
CO2: 22 mmol/L (ref 22–32)
Calcium: 8.6 mg/dL — ABNORMAL LOW (ref 8.9–10.3)
Chloride: 104 mmol/L (ref 101–111)
Creatinine, Ser: 1.16 mg/dL (ref 0.61–1.24)
GFR calc Af Amer: >60 mL/min (ref >60)
GFR calc non Af Amer: >60 mL/min (ref >60)
GLUCOSE: 135 mg/dL — AB (ref 65–99)
POTASSIUM: 3.6 mmol/L (ref 3.5–5.1)
SODIUM: 136 mmol/L (ref 135–145)
Total Bilirubin: 1.4 mg/dL — ABNORMAL HIGH (ref 0.3–1.2)
Total Protein: 7.5 g/dL (ref 6.5–8.1)

## 2016-01-19 LAB — CBC
HEMATOCRIT: 46.1 % (ref 39.0–52.0)
HEMOGLOBIN: 16 g/dL (ref 13.0–17.0)
MCH: 33.8 pg (ref 26.0–34.0)
MCHC: 34.7 g/dL (ref 30.0–36.0)
MCV: 97.5 fL (ref 78.0–100.0)
Platelets: 195 10*3/uL (ref 150–400)
RBC: 4.73 MIL/uL (ref 4.22–5.81)
RDW: 13.2 % (ref 11.5–15.5)
WBC: 7.2 10*3/uL (ref 4.0–10.5)

## 2016-01-19 LAB — URINALYSIS, ROUTINE W REFLEX MICROSCOPIC
BACTERIA UA: NONE SEEN
BILIRUBIN URINE: NEGATIVE
Glucose, UA: NEGATIVE mg/dL
Hgb urine dipstick: NEGATIVE
Ketones, ur: 5 mg/dL — AB
Leukocytes, UA: NEGATIVE
Nitrite: NEGATIVE
Protein, ur: 30 mg/dL — AB
RBC / HPF: NONE SEEN RBC/hpf (ref 0–5)
SPECIFIC GRAVITY, URINE: 1.031 — AB (ref 1.005–1.030)
pH: 5 (ref 5.0–8.0)

## 2016-01-19 LAB — LIPASE, BLOOD: Lipase: 25 U/L (ref 11–51)

## 2016-01-19 MED ORDER — SODIUM CHLORIDE 0.9 % IV BOLUS (SEPSIS)
1000.0000 mL | Freq: Once | INTRAVENOUS | Status: AC
  Administered 2016-01-19: 1000 mL via INTRAVENOUS

## 2016-01-19 MED ORDER — DIPHENOXYLATE-ATROPINE 2.5-0.025 MG PO TABS: 1.0000 | ORAL_TABLET | Freq: Four times a day (QID) | ORAL | 0 refills | Status: DC | PRN

## 2016-01-19 MED ORDER — ACETAMINOPHEN 500 MG PO TABS
1000.0000 mg | ORAL_TABLET | Freq: Once | ORAL | Status: AC
  Administered 2016-01-19: 1000 mg via ORAL
  Filled 2016-01-19: qty 2

## 2016-01-19 MED ORDER — ONDANSETRON HCL 4 MG/2ML IJ SOLN
4.0000 mg | Freq: Once | INTRAMUSCULAR | Status: AC | PRN
  Administered 2016-01-19: 4 mg via INTRAVENOUS
  Filled 2016-01-19: qty 2

## 2016-01-19 MED ORDER — ONDANSETRON HCL 8 MG PO TABS: 8.0000 mg | ORAL_TABLET | ORAL | 0 refills | Status: DC | PRN

## 2016-01-19 NOTE — ED Triage Notes (Signed)
Pt c/o cough, n/v/d, fever, chills that started yesterday,

## 2016-01-19 NOTE — Discharge Instructions (Signed)
Medication for nausea and diarrhea. Increase fluids. Tylenol for fever. Rest.

## 2016-01-19 NOTE — ED Provider Notes (Signed)
[facility] DEPT Provider Note   CSN: [id number] Arrival date & time: 01/19/16  [id number]     History   Chief Complaint Chief Complaint  Patient presents with  . Emesis    HPI Eddie Price is a 75 y.o. male.  Multiple episodes of nausea, vomiting, diarrhea for the past 24 hours with associated chills. Patient feels dehydrated. No stiff neck, cough, dysuria. Severity of symptoms is moderate. Nothing makes symptoms better or worse.      Past Medical History:  Diagnosis Date  . Acute meniscal tear of left knee   . Anginal pain ([place])    comes and goes; uses rest to relieve   . Anticoagulant long-term use   . Anxiety   . At risk for sleep apnea    STOP-BANG= 4       SENT TO PCP 05-04-2014  . Atrial fibrillation [facility])    new on-set Jan 2016--  with good rate control on atenolol  per cardiologist note Dr Bronson Ing 04-09-2014  . CAD, multiple vessel cardiologist--  dr Bronson Ing   3 vessel CAD //  s/p DES x1 to ostial RCA 06-17-2012  . Depression   . Dysrhythmia   . History of adenomatous polyp of colon    2009  . Hyperlipidemia   . Hypertension   . OA (osteoarthritis) of knee   . S/P drug eluting coronary stent placement    RCA x1  06-17-2012  . Shortness of breath dyspnea    with exercise     Patient Active Problem List   Diagnosis Date Noted  . Long term current use of anticoagulant therapy 12/09/2015  . History of colonic polyps 12/09/2015  . S/P knee replacement 01/03/2015  . Osteoarthritis of left knee 01/03/2015  . Chest pain 12/05/2014  . S/P left knee arthroscopy 05/08/2014  . Atrial fibrillation ([place]) 01/31/2014  . Olecranon bursitis 08/29/2013  . OA (osteoarthritis) of knee 08/29/2013  . Depression with anxiety 10/22/2012  . Obesity 10/22/2012  . Hyperlipidemia   . Postoperative ecchymosis 06/23/2012  . Coronary atherosclerosis of native coronary artery 06/16/2012  . Unstable angina ([place]) 06/16/2012  . Esophageal reflux 06/13/2012  . Essential  hypertension, benign 06/13/2012  . Impotence of organic origin 06/13/2012  . Degeneration of cervical intervertebral disc 06/13/2012    Past Surgical History:  Procedure Laterality Date  . CARDIAC CATHETERIZATION  06-15-2012   dr Angelena Form   Triple vessel CAD with severe ostial RCA stenosis, severe stenosis in the small caliber diagonal branch and small caliber OM1, OM2 branches/  preserved LVSF  . COLONOSCOPY  last one 07-01-2010   Dr. Madolyn Frieze. mild diveticulosis of left colon, small internal hemorrhoids. next tcs in 5 years due to h/o colon polyps.  . COLONOSCOPY N/A 12/18/2015   Procedure: COLONOSCOPY;  Surgeon: Daneil Dolin, MD;  Location: [facility];  Service: Endoscopy;  Laterality: N/A;  1:30 pm  . KNEE ARTHROSCOPY Right 2005  (approx)  . KNEE ARTHROSCOPY Left 05/08/2014   Procedure: LEFT ARTHROSCOPY KNEE WIGHT DEBRIDEMENT PARTIAL LATERAL MENISCECTOMY AND CHONDROPLASTY;  Surgeon: Sydnee Cabal, MD;  Location: [place];  Service: Orthopedics;  Laterality: Left;  . LAPAROSCOPIC CHOLECYSTECTOMY  2011   AND REPAIR UMBILICAL HERNIA  . LEFT ELBOW SURGERY  1969  . PERCUTANEOUS CORONARY STENT INTERVENTION (PCI-S) N/A 06/17/2012   Procedure: PERCUTANEOUS CORONARY STENT INTERVENTION (PCI-S);  Surgeon: Burnell Blanks, MD;  Location: [facility] CATH LAB;  Service: Cardiovascular;  Laterality: N/A;  DES x1 ostial RCA  .  SHOULDER ARTHROSCOPY WITH ROTATOR CUFF REPAIR Bilateral x2 left//   x1 right (last one , left in 2001)  . TONSILLECTOMY  1970  . TOTAL HIP ARTHROPLASTY Right 05-25-2006  . TOTAL KNEE ARTHROPLASTY Left 01/03/2015   Procedure: TOTAL KNEE ARTHROPLASTY;  Surgeon: Sydnee Cabal, MD;  Location: [facility];  Service: Orthopedics;  Laterality: Left;  . TRANSTHORACIC ECHOCARDIOGRAM  02-08-2014   mild focal basal hypertrophy of the septum, ef 55-60%,  indeterminate diastolic function in setting of atrial fib./  possibly bicuspid AV, mild calcification without  stenosis, trivial AR/  mild MR and PR/  moderate to severe LAE/  trivial PR/  moderate RAE  . trauma to left finger     partially amputation secondary to lawnmower accident   . VASECTOMY  1970'sj  w/  general anesthesia       Home Medications    Prior to Admission medications   Medication Sig Start Date End Date Taking? Authorizing Provider  aspirin EC 81 MG tablet Take 81 mg by mouth daily.   Yes Historical Provider, MD  atenolol (TENORMIN) 50 MG tablet TAKE ONE TABLET BY MOUTH ONCE DAILY 01/13/16  Yes Alycia Rossetti, MD  atorvastatin (LIPITOR) 10 MG tablet Take 1 tablet (10 mg total) by mouth daily. 07/25/15  Yes Alycia Rossetti, MD  ELIQUIS 5 MG TABS tablet TAKE ONE TABLET BY MOUTH TWICE DAILY 04/05/15  Yes Herminio Commons, MD  Multiple Vitamin (MULTIVITAMIN) capsule Take 1 capsule by mouth daily.   Yes Historical Provider, MD  nitroGLYCERIN (NITROSTAT) 0.4 MG SL tablet Place 1 tablet (0.4 mg total) under the tongue every 5 (five) minutes x 3 doses as needed for chest pain. 04/24/13  Yes Alycia Rossetti, MD  Omega 3 1000 MG CAPS Take 2,000 mg by mouth 2 (two) times daily.   Yes Historical Provider, MD  sertraline (ZOLOFT) 100 MG tablet TAKE ONE TABLET BY MOUTH ONCE DAILY 09/13/15  Yes Alycia Rossetti, MD  vitamin E (VITAMIN E) 400 UNIT capsule Take 800 Units by mouth daily.   Yes Historical Provider, MD  diphenoxylate-atropine (LOMOTIL) 2.5-0.025 MG tablet Take 1 tablet by mouth 4 (four) times daily as needed for diarrhea or loose stools. 01/19/16   Nat Christen, MD  ondansetron (ZOFRAN) 8 MG tablet Take 1 tablet (8 mg total) by mouth every 4 (four) hours as needed. 01/19/16   Nat Christen, MD    Family History Family History  Problem Relation Age of Onset  . Heart failure Mother   . COPD Mother   . Depression Mother   . Mental illness Mother   . Heart failure Father   . Stroke Father   . Heart attack Father   . Heart disease Father   . Hyperlipidemia Father   . Cancer  Maternal Grandmother     cervical or ovarian  . Colon cancer Neg Hx     Social History Social History  Substance Use Topics  . Smoking status: Former Smoker    Packs/day: 0.50    Years: 8.00    Types: Cigarettes    Start date: 02/02/1959    Quit date: 01/27/1968  . Smokeless tobacco: Former Systems developer    Types: Chew    Quit date: 01/26/1970  . Alcohol use No     Comment: RARE     Allergies   Sulfa antibiotics   Review of Systems Review of Systems  All other systems reviewed and are negative.    Physical Exam Updated Vital Signs  BP 100/58   Pulse 76   Temp 99.4 F (37.4 C) (Oral)   Resp 15   Ht 5\' 11"  (1.803 m)   Wt 218 lb (98.9 kg)   SpO2 96%   BMI 30.40 kg/m   Physical Exam  Constitutional: He is oriented to person, place, and time.  Appears dehydrated  HENT:  Head: Normocephalic and atraumatic.  Eyes: Conjunctivae are normal.  Neck: Neck supple.  Cardiovascular: Normal rate and regular rhythm.   Pulmonary/Chest: Effort normal and breath sounds normal.  Abdominal: Soft. Bowel sounds are normal.  Musculoskeletal: Normal range of motion.  Neurological: He is alert and oriented to person, place, and time.  Skin: Skin is warm and dry.  Psychiatric: He has a normal mood and affect. His behavior is normal.  Nursing note and vitals reviewed.    ED Treatments / Results  Labs (all labs ordered are listed, but only abnormal results are displayed) Labs Reviewed  COMPREHENSIVE METABOLIC PANEL - Abnormal; Notable for the following:       Result Value   Glucose, Bld 135 (*)    Calcium 8.6 (*)    Total Bilirubin 1.4 (*)    All other components within normal limits  URINALYSIS, ROUTINE W REFLEX MICROSCOPIC - Abnormal; Notable for the following:    Color, Urine AMBER (*)    APPearance HAZY (*)    Specific Gravity, Urine 1.031 (*)    Ketones, ur 5 (*)    Protein, ur 30 (*)    All other components within normal limits  LIPASE, BLOOD  CBC    EKG  EKG  Interpretation None       Radiology Dg Chest 2 View  Result Date: 01/19/2016 CLINICAL DATA:  Cough, fever and vomiting since 2100 hours EXAM: CHEST  2 VIEW COMPARISON:  None FINDINGS: Minimal enlargement of cardiac silhouette. Atherosclerotic calcification mild tortuosity of thoracic aorta. Pulmonary vascularity normal. Bronchitic changes without infiltrate, pleural effusion or pneumothorax. Bones appear demineralized. IMPRESSION: Minimal enlargement of cardiac silhouette. Bronchitic changes without infiltrate. Aortic atherosclerosis. Electronically Signed   By: Lavonia Dana M.D.   On: 01/19/2016 08:16    Procedures Procedures (including critical care time)  Medications Ordered in ED Medications  ondansetron (ZOFRAN) injection 4 mg (4 mg Intravenous Given 01/19/16 0645)  sodium chloride 0.9 % bolus 1,000 mL (0 mLs Intravenous Stopped 01/19/16 1022)  sodium chloride 0.9 % bolus 1,000 mL (0 mLs Intravenous Stopped 01/19/16 0831)  acetaminophen (TYLENOL) tablet 1,000 mg (1,000 mg Oral Given 01/19/16 0728)  sodium chloride 0.9 % bolus 1,000 mL (0 mLs Intravenous Stopped 01/19/16 1145)     Initial Impression / Assessment and Plan / ED Course  I have reviewed the triage vital signs and the nursing notes.  Pertinent labs & imaging results that were available during my care of the patient were reviewed by me and considered in my medical decision making (see chart for details).  Clinical Course     Patient is not septic. He feels much better after 3 L of IV fluids. He is ambulatory and alert. Discharge medications Zofran 8 mg and Lomotil.  Final Clinical Impressions(s) / ED Diagnoses   Final diagnoses:  Nausea and vomiting, intractability of vomiting not specified, unspecified vomiting type    New Prescriptions New Prescriptions   DIPHENOXYLATE-ATROPINE (LOMOTIL) 2.5-0.025 MG TABLET    Take 1 tablet by mouth 4 (four) times daily as needed for diarrhea or loose stools.   ONDANSETRON  (ZOFRAN) 8 MG TABLET  Take 1 tablet (8 mg total) by mouth every 4 (four) hours as needed.     Nat Christen, MD 01/19/16 [phone]

## 2016-01-21 ENCOUNTER — Ambulatory Visit: Payer: Self-pay | Admitting: Adult Health

## 2016-01-23 ENCOUNTER — Encounter: Payer: Self-pay | Admitting: Adult Health

## 2016-01-23 ENCOUNTER — Ambulatory Visit (INDEPENDENT_AMBULATORY_CARE_PROVIDER_SITE_OTHER): Payer: Medicare Other | Admitting: Adult Health

## 2016-01-23 VITALS — BP 132/82 | HR 66 | Ht 71.0 in | Wt 227.0 lb

## 2016-01-23 DIAGNOSIS — I10 Essential (primary) hypertension: Secondary | ICD-10-CM

## 2016-01-23 DIAGNOSIS — I4891 Unspecified atrial fibrillation: Secondary | ICD-10-CM

## 2016-01-23 NOTE — Progress Notes (Signed)
Cardiology Office Note   Date:  01/23/2016   ID:  Eddie Price, DOB 1941/01/10, MRN [id number]  PCP:  Vic Blackbird, MD  Cardiologist:  Woodroe Chen, NP   Chief Complaint  Patient presents with  . Coronary Artery Disease  . Atrial Fibrillation  . Hypertension      History of Present Illness: Eddie Price is a 75 y.o. male who presents for ongoing assessment and management of coronary artery disease, drug-eluting stent to the ostial right coronary artery on 06/17/2012, atrial fibrillation, CHADS VASC Score of 4, on ELIQUIS, chronic angina, hypertension, and hyperlipidemia.  He was last seen in the office in April 2017, after being seen in [place] ER for A. fib RVR, he was continued on current medication, rule out for MI at that time. He is here for follow-up after placement of a cardiac monitor to evaluate for recurrent atrial fibrillation with rapid rate to assess need to increase rate control medication. This apparently was not completed.  He denies any cardiac complains today.  He is medically compliant.   Past Medical History:  Diagnosis Date  . Acute meniscal tear of left knee   . Anginal pain ([place])    comes and goes; uses rest to relieve   . Anticoagulant long-term use   . Anxiety   . At risk for sleep apnea    STOP-BANG= 4       SENT TO PCP 05-04-2014  . Atrial fibrillation [facility])    new on-set Jan 2016--  with good rate control on atenolol  per cardiologist note Dr Bronson Ing 04-09-2014  . CAD, multiple vessel cardiologist--  dr Bronson Ing   3 vessel CAD //  s/p DES x1 to ostial RCA 06-17-2012  . Depression   . Dysrhythmia   . History of adenomatous polyp of colon    2009  . Hyperlipidemia   . Hypertension   . OA (osteoarthritis) of knee   . S/P drug eluting coronary stent placement    RCA x1  06-17-2012  . Shortness of breath dyspnea    with exercise     Past Surgical History:  Procedure Laterality Date  . CARDIAC CATHETERIZATION   06-15-2012   dr Angelena Form   Triple vessel CAD with severe ostial RCA stenosis, severe stenosis in the small caliber diagonal branch and small caliber OM1, OM2 branches/  preserved LVSF  . COLONOSCOPY  last one 07-01-2010   Dr. Madolyn Frieze. mild diveticulosis of left colon, small internal hemorrhoids. next tcs in 5 years due to h/o colon polyps.  . COLONOSCOPY N/A 12/18/2015   Procedure: COLONOSCOPY;  Surgeon: Daneil Dolin, MD;  [facility];  Service: Endoscopy;  Laterality: N/A;  1:30 pm  . KNEE ARTHROSCOPY Right 2005  (approx)  . KNEE ARTHROSCOPY Left 05/08/2014   Procedure: LEFT ARTHROSCOPY KNEE WIGHT DEBRIDEMENT PARTIAL LATERAL MENISCECTOMY AND CHONDROPLASTY;  Surgeon: Sydnee Cabal, MD;  Location: [place];  Service: Orthopedics;  Laterality: Left;  . LAPAROSCOPIC CHOLECYSTECTOMY  2011   AND REPAIR UMBILICAL HERNIA  . LEFT ELBOW SURGERY  1969  . PERCUTANEOUS CORONARY STENT INTERVENTION (PCI-S) N/A 06/17/2012   Procedure: PERCUTANEOUS CORONARY STENT INTERVENTION (PCI-S);  Surgeon: Burnell Blanks, MD;  Location: [facility] CATH LAB;  Service: Cardiovascular;  Laterality: N/A;  DES x1 ostial RCA  . SHOULDER ARTHROSCOPY WITH ROTATOR CUFF REPAIR Bilateral x2 left//   x1 right (last one , left in 2001)  . TONSILLECTOMY  1970  . TOTAL HIP ARTHROPLASTY Right  05-25-2006  . TOTAL KNEE ARTHROPLASTY Left 01/03/2015   Procedure: TOTAL KNEE ARTHROPLASTY;  Surgeon: Sydnee Cabal, MD;  Location: [facility];  Service: Orthopedics;  Laterality: Left;  . TRANSTHORACIC ECHOCARDIOGRAM  02-08-2014   mild focal basal hypertrophy of the septum, ef 55-60%,  indeterminate diastolic function in setting of atrial fib./  possibly bicuspid AV, mild calcification without stenosis, trivial AR/  mild MR and PR/  moderate to severe LAE/  trivial PR/  moderate RAE  . trauma to left finger     partially amputation secondary to lawnmower accident   . VASECTOMY  1970'sj  w/  general anesthesia      Current Outpatient Prescriptions  Medication Sig Dispense Refill  . aspirin EC 81 MG tablet Take 81 mg by mouth daily.    Marland Kitchen atenolol (TENORMIN) 50 MG tablet TAKE ONE TABLET BY MOUTH ONCE DAILY 90 tablet 0  . atorvastatin (LIPITOR) 10 MG tablet Take 1 tablet (10 mg total) by mouth daily. 90 tablet 3  . ELIQUIS 5 MG TABS tablet TAKE ONE TABLET BY MOUTH TWICE DAILY 180 tablet 3  . Multiple Vitamin (MULTIVITAMIN) capsule Take 1 capsule by mouth daily.    . nitroGLYCERIN (NITROSTAT) 0.4 MG SL tablet Place 1 tablet (0.4 mg total) under the tongue every 5 (five) minutes x 3 doses as needed for chest pain. 30 tablet 3  . Omega 3 1000 MG CAPS Take 2,000 mg by mouth 2 (two) times daily.    . sertraline (ZOLOFT) 100 MG tablet TAKE ONE TABLET BY MOUTH ONCE DAILY 90 tablet 1  . vitamin E (VITAMIN E) 400 UNIT capsule Take 800 Units by mouth daily.     No current facility-administered medications for this visit.     Allergies:   Sulfa antibiotics    Social History:  The patient  reports that he quit smoking about 48 years ago. His smoking use included Cigarettes. He started smoking about 57 years ago. He has a 4.00 pack-year smoking history. He quit smokeless tobacco use about 46 years ago. His smokeless tobacco use included Chew. He reports that he does not drink alcohol or use drugs.   Family History:  The patient's family history includes COPD in his mother; Cancer in his maternal grandmother; Depression in his mother; Heart attack in his father; Heart disease in his father; Heart failure in his father and mother; Hyperlipidemia in his father; Mental illness in his mother; Stroke in his father.    ROS: All other systems are reviewed and negative. Unless otherwise mentioned in H&P    PHYSICAL EXAM: VS:  BP 132/82   Pulse 66   Ht 5\' 11"  (1.803 m)   Wt 227 lb (103 kg)   SpO2 96%   BMI 31.66 kg/m  , BMI Body mass index is 31.66 kg/m. GEN: Well nourished, well developed, in no acute  distress  HEENT: normal  Neck: no JVD, carotid bruits, or masses Cardiac: RRR; no murmurs, rubs, or gallops,no edema  Respiratory:  clear to auscultation bilaterally, normal work of breathing GI: soft, nontender, nondistended, + BS MS: no deformity or atrophy  Skin: warm and dry, no rash Neuro:  Strength and sensation are intact Psych: euthymic mood, full affect   EKG: The ekg ordered today demonstrates Atrial fib rate of 62 bpm  Recent Labs: 01/19/2016: ALT 22; BUN 18; Creatinine, Ser 1.16; Hemoglobin 16.0; Platelets 195; Potassium 3.6; Sodium 136    Lipid Panel    Component Value Date/Time   CHOL 82 (  L) 07/24/2015 0925   TRIG 94 07/24/2015 0925   HDL 28 (L) 07/24/2015 0925   CHOLHDL 2.9 07/24/2015 0925   VLDL 19 07/24/2015 0925   LDLCALC 35 07/24/2015 0925      Wt Readings from Last 3 Encounters:  01/23/16 227 lb (103 kg)  01/19/16 218 lb (98.9 kg)  12/18/15 232 lb (105.2 kg)    Echocardiogram: 02/09/2015 Left ventricle: The cavity size was normal. There was mild focal basal hypertrophy of the septum. Systolic function was normal. The estimated ejection fraction was in the range of 55% to 60%. Wall motion was normal; there were no regional wall motion abnormalities. The study is not technically sufficient to allow evaluation of LV diastolic function. - Aortic valve: Possibly bicuspid; mildly calcified leaflets. There was no stenosis. There was trivial regurgitation. Mean gradient (S): 4 mm Hg. - Aortic root: The aortic root was mildly ectatic. - Mitral valve: Calcified annulus. There was mild regurgitation. - Left atrium: The atrium was moderately to severely dilated. - Right atrium: The atrium was moderately dilated. Central venous pressure (est): 3 mm Hg. - Atrial septum: The septum bowed from left to right, consistent with increased left atrial pressure. No defect or patent foramen ovale was identified. - Tricuspid valve: There was trivial  regurgitation. - Pulmonary arteries: PA peak pressure: 24 mm Hg (S). - Pericardium, extracardiac: There was no pericardial effusion.  ASSESSMENT AND PLAN:  1.  Atrial fib: Heart rate is currently well-controlled. He denies any bleeding issues on ELIQUIS. Will not make any changes in his current medication regimen. He is followed by primary care and has had recent labs. His only complaint was being in the "donut hole" and having to pay extra for [facility]. Marland Kitchen Aspirin as he is on a DOAC.   2. Hypertension: Blood pressure is currently well-controlled. No changes in his regimen.  3. Hypercholesterolemia: Patient will continue on atorvastatin therapy.   Current medicines are reviewed at length with the patient today.    Labs/ tests ordered today include:   Orders Placed This Encounter  Procedures  . EKG 12-Lead     Disposition:   FU with  6 months unless symptomatic Signed, Jory Sims, NP  01/23/2016 3:26 PM    [facility] [address] [place], [place] [address] Phone: [phone]; Fax: [phone]

## 2016-01-23 NOTE — Progress Notes (Signed)
Name: Eddie Price    DOB: 12-30-1940  Age: 75 y.o.  MR#: [id number]       PCP:  Vic Blackbird, MD      Insurance: Payor: [employer] MEDICARE / Plan: [facility] MEDICARE / Product Type: *No Product type* /   CC:    Chief Complaint  Patient presents with  . Coronary Artery Disease  . Atrial Fibrillation  . Hypertension    VS Vitals:   01/23/16 1459  BP: 132/82  Pulse: 66  SpO2: 96%  Weight: 227 lb (103 kg)  Height: 5\' 11"  (1.803 m)    Weights Current Weight  01/23/16 227 lb (103 kg)  01/19/16 218 lb (98.9 kg)  12/18/15 232 lb (105.2 kg)    Blood Pressure  BP Readings from Last 3 Encounters:  01/23/16 132/82  01/19/16 111/68  12/18/15 99/73     Admit date:  (Not on file) Last encounter with RMR:  Visit date not found   Allergy Sulfa antibiotics  Current Outpatient Prescriptions  Medication Sig Dispense Refill  . aspirin EC 81 MG tablet Take 81 mg by mouth daily.    Marland Kitchen atenolol (TENORMIN) 50 MG tablet TAKE ONE TABLET BY MOUTH ONCE DAILY 90 tablet 0  . atorvastatin (LIPITOR) 10 MG tablet Take 1 tablet (10 mg total) by mouth daily. 90 tablet 3  . ELIQUIS 5 MG TABS tablet TAKE ONE TABLET BY MOUTH TWICE DAILY 180 tablet 3  . Multiple Vitamin (MULTIVITAMIN) capsule Take 1 capsule by mouth daily.    . nitroGLYCERIN (NITROSTAT) 0.4 MG SL tablet Place 1 tablet (0.4 mg total) under the tongue every 5 (five) minutes x 3 doses as needed for chest pain. 30 tablet 3  . Omega 3 1000 MG CAPS Take 2,000 mg by mouth 2 (two) times daily.    . sertraline (ZOLOFT) 100 MG tablet TAKE ONE TABLET BY MOUTH ONCE DAILY 90 tablet 1  . vitamin E (VITAMIN E) 400 UNIT capsule Take 800 Units by mouth daily.     No current facility-administered medications for this visit.     Discontinued Meds:    Medications Discontinued During This Encounter  Medication Reason  . diphenoxylate-atropine (LOMOTIL) 2.5-0.025 MG tablet Error  . ondansetron (ZOFRAN) 8 MG tablet Error    Patient Active  Problem List   Diagnosis Date Noted  . Long term current use of anticoagulant therapy 12/09/2015  . History of colonic polyps 12/09/2015  . S/P knee replacement 01/03/2015  . Osteoarthritis of left knee 01/03/2015  . Chest pain 12/05/2014  . S/P left knee arthroscopy 05/08/2014  . Atrial fibrillation ([place]) 01/31/2014  . Olecranon bursitis 08/29/2013  . OA (osteoarthritis) of knee 08/29/2013  . Depression with anxiety 10/22/2012  . Obesity 10/22/2012  . Hyperlipidemia   . Postoperative ecchymosis 06/23/2012  . Coronary atherosclerosis of native coronary artery 06/16/2012  . Unstable angina ([place]) 06/16/2012  . Esophageal reflux 06/13/2012  . Essential hypertension, benign 06/13/2012  . Impotence of organic origin 06/13/2012  . Degeneration of cervical intervertebral disc 06/13/2012    LABS    Component Value Date/Time   NA 136 01/19/2016 0639   NA 142 07/24/2015 0925   NA 137 01/29/2015 1320   K 3.6 01/19/2016 0639   K 5.2 07/24/2015 0925   K 4.6 01/29/2015 1320   CL 104 01/19/2016 0639   CL 105 07/24/2015 0925   CL 105 01/29/2015 1320   CO2 22 01/19/2016 0639   CO2 24 07/24/2015 0925   CO2  28 01/29/2015 1320   GLUCOSE 135 (H) 01/19/2016 0639   GLUCOSE 97 07/24/2015 0925   GLUCOSE 106 (H) 01/29/2015 1320   BUN 18 01/19/2016 0639   BUN 11 07/24/2015 0925   BUN 13 01/29/2015 1320   CREATININE 1.16 01/19/2016 0639   CREATININE 1.06 07/24/2015 0925   CREATININE 1.07 01/29/2015 1320   CREATININE 1.12 01/04/2015 0543   CREATININE 1.00 12/05/2014 1057   CREATININE 1.14 06/04/2014 1047   CALCIUM 8.6 (L) 01/19/2016 0639   CALCIUM 9.3 07/24/2015 0925   CALCIUM 9.0 01/29/2015 1320   GFRNONAA >60 01/19/2016 0639   GFRNONAA >60 01/29/2015 1320   GFRNONAA >60 01/04/2015 0543   GFRAA >60 01/19/2016 0639   GFRAA >60 01/29/2015 1320   GFRAA >60 01/04/2015 0543   CMP     Component Value Date/Time   NA 136 01/19/2016 0639   K 3.6 01/19/2016 0639   CL 104 01/19/2016 0639    CO2 22 01/19/2016 0639   GLUCOSE 135 (H) 01/19/2016 0639   BUN 18 01/19/2016 0639   CREATININE 1.16 01/19/2016 0639   CREATININE 1.06 07/24/2015 0925   CALCIUM 8.6 (L) 01/19/2016 0639   PROT 7.5 01/19/2016 0639   ALBUMIN 4.0 01/19/2016 0639   AST 20 01/19/2016 0639   ALT 22 01/19/2016 0639   ALKPHOS 39 01/19/2016 0639   BILITOT 1.4 (H) 01/19/2016 0639   GFRNONAA >60 01/19/2016 0639   GFRAA >60 01/19/2016 0639       Component Value Date/Time   WBC 7.2 01/19/2016 0639   WBC 5.5 07/24/2015 0925   WBC 10.4 01/05/2015 0407   HGB 16.0 01/19/2016 0639   HGB 14.9 07/24/2015 0925   HGB 11.8 (L) 01/05/2015 0407   HCT 46.1 01/19/2016 0639   HCT 45.0 07/24/2015 0925   HCT 35.3 (L) 01/05/2015 0407   MCV 97.5 01/19/2016 0639   MCV 97.6 07/24/2015 0925   MCV 97.2 01/05/2015 0407    Lipid Panel     Component Value Date/Time   CHOL 82 (L) 07/24/2015 0925   TRIG 94 07/24/2015 0925   HDL 28 (L) 07/24/2015 0925   CHOLHDL 2.9 07/24/2015 0925   VLDL 19 07/24/2015 0925   LDLCALC 35 07/24/2015 0925    ABG    Component Value Date/Time   PHART 7.422 06/17/2012 1444   PCO2ART 34.5 (L) 06/17/2012 1444   PO2ART 74.0 (L) 06/17/2012 1444   HCO3 22.5 06/17/2012 1444   TCO2 24 06/17/2012 1444   ACIDBASEDEF 1.0 06/17/2012 1444   O2SAT 95.0 06/17/2012 1444     Lab Results  Component Value Date   TSH 1.279 01/31/2014   BNP (last 3 results) No results for input(s): BNP in the last 8760 hours.  ProBNP (last 3 results) No results for input(s): PROBNP in the last 8760 hours.  Cardiac Panel (last 3 results) No results for input(s): CKTOTAL, CKMB, TROPONINI, RELINDX in the last 72 hours.  Iron/TIBC/Ferritin/ %Sat No results found for: IRON, TIBC, FERRITIN, IRONPCTSAT   EKG Orders placed or performed in visit on 12/05/14  . EKG 12-Lead     Prior Assessment and Plan Problem List as of 01/23/2016 Reviewed: 12/09/2015 10:49 AM by Neil Crouch, PA-C     Cardiovascular and Mediastinum    Essential hypertension, benign   Last Assessment & Plan 07/24/2015 Office Visit Written 07/25/2015 10:24 AM by Alycia Rossetti, MD    Well controlled, no change to meds      Coronary atherosclerosis of native coronary artery  Last Assessment & Plan 07/24/2015 Office Visit Written 07/25/2015 10:24 AM by Alycia Rossetti, MD    Doing well, no CP no use of NTG       Atrial fibrillation [facility])   Last Assessment & Plan 12/05/2014 Office Visit Written 12/05/2014  2:06 PM by Imogene Burn, PA-C    Patient with chronic atrial fibrillation. He is having some palpitations now and may be having fast heart rates. Increase atenolol to 50 mg in the morning 25 in the evening. Place an event monitor on him to rule out rapid atrial fibrillation.      Unstable angina [facility])   Last Assessment & Plan 03/20/2013 Office Visit Written 03/20/2013  3:59 PM by Lendon Colonel, NP    He has had no recurrence of angina. He has not had to take any nitroglycerin sublingual. I expressed my concerns with him being on Viagra and Revatio, and also having nitroglycerin. I have advised him that he is not to use the sildenafil and nitroglycerin within 24 hours of taking one or the other. He has not needed to use nitroglycerin at all. I have asked him to speak with his primary care physician about the complexity of this medication. He verbalizes understanding.        Digestive   Esophageal reflux     Musculoskeletal and Integument   Degeneration of cervical intervertebral disc   Olecranon bursitis   Last Assessment & Plan 08/29/2013 Office Visit Written 08/29/2013 12:49 PM by Alycia Rossetti, MD    I will go ahead and set him up with his orthopedist. He is starting had aspiration is currently on cephelaxin, ICE, no NSAIDS due to plavix, cardiac disease      OA (osteoarthritis) of knee   Last Assessment & Plan 08/29/2013 Office Visit Written 08/29/2013 12:51 PM by Alycia Rossetti, MD    Refer to ortho as they  Have seen him for  this before      Osteoarthritis of left knee     Genitourinary   Impotence of organic origin   Last Assessment & Plan 04/24/2013 Office Visit Written 04/24/2013  9:23 PM by Alycia Rossetti, MD    Viagra will be continued discussed the use of this with nitroglycerin which he is never required        Other   Hyperlipidemia   Last Assessment & Plan 08/29/2013 Office Visit Written 08/29/2013 12:50 PM by Alycia Rossetti, MD    Recheck FLP, goal LDL < 100      Postoperative ecchymosis   Last Assessment & Plan 06/23/2012 Office Visit Written 06/23/2012 12:33 PM by Lendon Colonel, NP    Area is thoroughly examined with significant skin discoloration of the groin, penis and testicle. Equisitely painful with movement and palpations. Has been ruled out for psuedoanerym.   He has been placed on vicodin by ER. I will start him tramadol 50 mg every 6-8 hours around the clock for the next few days and then prn. He is to put ice/heat on the site for comfort.       Depression with anxiety   Last Assessment & Plan 06/21/2015 Office Visit Written 06/21/2015  3:02 PM by Alycia Rossetti, MD    Continue zoloft, doing well with this      Obesity   S/P left knee arthroscopy   Chest pain   Last Assessment & Plan 12/05/2014 Office Visit Edited 12/05/2014  2:04 PM by Imogene Burn, PA-C  Patient comes in today complaining of six-week history of exertional chest tightness as well as chest tightness at rest associated with palpitations. He does have history of coronary artery disease with PCI of the RCA in 2014 with residual disease. He also had chest pain that correlated with heart rates in the 110-120 bpm on an event monitor in January 2016. Because of this I will increase his atenolol to 50 mg in the morning 20 5 in the evening. I will order a Lexi scan Myoview to rule out ischemia. I will also place a 48 hour monitor on him to rule out uncontrolled atrial fibrillation. Follow-up with Dr.Koneswaran in 2 weeks.  He is advised to call 911 or go to nearest emergency room if he has any prolonged chest pain. I'm also available to see him next week if needed. Patient is also scheduled for knee surgery in December. This will help enable [place] to clear him for surgery.      S/P knee replacement   Long term current use of anticoagulant therapy   History of colonic polyps   Last Assessment & Plan 12/09/2015 Office Visit Written 12/09/2015 11:18 AM by Mahala Menghini, PA-C    75 year old gentleman with history of colon polyps, due for 5 year surveillance colonoscopy. Last colonoscopy 06/2010, in [facility]. Patient is on Eliquis for A. fib. Will hold 48 hours prior procedure. Plan on colonoscopy in near future. I have discussed the risks, alternatives, benefits with regards to but not limited to the risk of reaction to medication, bleeding, infection, perforation and the patient is agreeable to proceed. Written consent to be obtained.  Instructions to resume Eliquis to be provided after his colonoscopy.           Imaging: Dg Chest 2 View  Result Date: 01/19/2016 CLINICAL DATA:  Cough, fever and vomiting since 2100 hours EXAM: CHEST  2 VIEW COMPARISON:  None FINDINGS: Minimal enlargement of cardiac silhouette. Atherosclerotic calcification mild tortuosity of thoracic aorta. Pulmonary vascularity normal. Bronchitic changes without infiltrate, pleural effusion or pneumothorax. Bones appear demineralized. IMPRESSION: Minimal enlargement of cardiac silhouette. Bronchitic changes without infiltrate. Aortic atherosclerosis. Electronically Signed   By: Lavonia Dana M.D.   On: 01/19/2016 08:16

## 2016-01-23 NOTE — Patient Instructions (Signed)
Your physician wants you to follow-up in: 6 Months with Dr. Bronson Ing, You will receive a reminder letter in the mail two months in advance. If you don't receive a letter, please call our office to schedule the follow-up appointment.  Your physician recommends that you continue on your current medications as directed. Please refer to the Current Medication list given to you today.  If you need a refill on your cardiac medications before your next appointment, please call your pharmacy.  Thank you for choosing [facility]!

## 2016-01-28 ENCOUNTER — Ambulatory Visit: Payer: Medicare Other | Admitting: Family Medicine

## 2016-01-29 DIAGNOSIS — L821 Other seborrheic keratosis: Secondary | ICD-10-CM | POA: Diagnosis not present

## 2016-01-29 DIAGNOSIS — D235 Other benign neoplasm of skin of trunk: Secondary | ICD-10-CM | POA: Diagnosis not present

## 2016-01-29 DIAGNOSIS — D1801 Hemangioma of skin and subcutaneous tissue: Secondary | ICD-10-CM | POA: Diagnosis not present

## 2016-01-29 DIAGNOSIS — L814 Other melanin hyperpigmentation: Secondary | ICD-10-CM | POA: Diagnosis not present

## 2016-02-05 ENCOUNTER — Encounter: Payer: Self-pay | Admitting: Family Medicine

## 2016-02-05 ENCOUNTER — Ambulatory Visit (INDEPENDENT_AMBULATORY_CARE_PROVIDER_SITE_OTHER): Payer: Medicare Other | Admitting: Family Medicine

## 2016-02-05 VITALS — BP 130/88 | HR 84 | Temp 98.9°F | Resp 14 | Ht 71.0 in | Wt 224.0 lb

## 2016-02-05 DIAGNOSIS — I482 Chronic atrial fibrillation, unspecified: Secondary | ICD-10-CM

## 2016-02-05 DIAGNOSIS — I25118 Atherosclerotic heart disease of native coronary artery with other forms of angina pectoris: Secondary | ICD-10-CM

## 2016-02-05 DIAGNOSIS — E7800 Pure hypercholesterolemia, unspecified: Secondary | ICD-10-CM

## 2016-02-05 DIAGNOSIS — I10 Essential (primary) hypertension: Secondary | ICD-10-CM | POA: Diagnosis not present

## 2016-02-05 LAB — CBC WITH DIFFERENTIAL/PLATELET
BASOS ABS: 71 {cells}/uL (ref 0–200)
Basophils Relative: 1 %
EOS PCT: 5 %
Eosinophils Absolute: 355 cells/uL (ref 15–500)
HEMATOCRIT: 45.8 % (ref 38.5–50.0)
Hemoglobin: 15.3 g/dL (ref 13.0–17.0)
LYMPHS PCT: 34 %
Lymphs Abs: 2414 cells/uL (ref 850–3900)
MCH: 32.8 pg (ref 27.0–33.0)
MCHC: 33.4 g/dL (ref 32.0–36.0)
MCV: 98.3 fL (ref 80.0–100.0)
MPV: 9.2 fL (ref 7.5–12.5)
Monocytes Absolute: 710 cells/uL (ref 200–950)
Monocytes Relative: 10 %
NEUTROS PCT: 50 %
Neutro Abs: 3550 cells/uL (ref 1500–7800)
Platelets: 244 10*3/uL (ref 140–400)
RBC: 4.66 MIL/uL (ref 4.20–5.80)
RDW: 13.3 % (ref 11.0–15.0)
WBC: 7.1 10*3/uL (ref 3.8–10.8)

## 2016-02-05 NOTE — Progress Notes (Signed)
   Subjective:    Patient ID: Eddie Price, male    DOB: 12/27/1940, 76 y.o.   MRN: [id number]  Patient presents for 6 month F/U (is fasting)  Pt here to f/u chronic medical problems. Reviewed recent cardiology note.  Due for fasitng labs,taking medicaitons as prescribed  He did have what sounds like stomach bug or viral illness was seen in the emergency room right after the holidays he was given fluids resolved from this illness his appetite is back to baseline and his bowels are back to baseline.  He does continue to have some knee issues he is still following with his orthopedics Dr. Theda Sers  He has no new concerns today he is fasting      Review Of Systems:  GEN- denies fatigue, fever, weight loss,weakness, recent illness HEENT- denies eye drainage, change in vision, nasal discharge, CVS- denies chest pain, palpitations RESP- denies SOB, cough, wheeze ABD- denies N/V, change in stools, abd pain GU- denies dysuria, hematuria, dribbling, incontinence MSK- denies joint pain, muscle aches, injury Neuro- denies headache, dizziness, syncope, seizure activity       Objective:    BP 130/88 (BP Location: Left Arm, Patient Position: Sitting, Cuff Size: Large)   Pulse 84   Temp 98.9 F (37.2 C) (Oral)   Resp 14   Ht 5\' 11"  (1.803 m)   Wt 224 lb (101.6 kg)   SpO2 98%   BMI 31.24 kg/m  GEN- NAD, alert and oriented x3 HEENT- PERRL, EOMI, non injected sclera, pink conjunctiva, MMM, oropharynx clear CVS- RRR, no murmur RESP-CTAB ABD-NABS,soft,NT,ND EXT- No edema Pulses- Radial2+        Assessment & Plan:      Problem List Items Addressed This Visit    Hyperlipidemia - Primary (Chronic)   Relevant Orders   Lipid panel (Completed)   Essential hypertension, benign (Chronic)    Blood pressure is well-controlled or change his medication his fasting labs will be done today. His coronary artery disease also history of A. fib is currently in sinus rhythm he is taking his  blood thinners as prescribed. His rate is controlled.  He does have renal episodes of chest discomfort but they only lasts a few sex advised of these increasing needs to see his cardiologist in course he uses nitroglycerin as needed.      Relevant Orders   CBC with Differential/Platelet (Completed)   Comprehensive metabolic panel (Completed)   Coronary atherosclerosis of native coronary artery (Chronic)   Atrial fibrillation (HCC) (Chronic)      Note: This dictation was prepared with Dragon dictation along with smaller phrase technology. Any transcriptional errors that result from this process are unintentional.

## 2016-02-05 NOTE — Patient Instructions (Signed)
F/U 6 months for Physical  

## 2016-02-06 ENCOUNTER — Encounter: Payer: Self-pay | Admitting: *Deleted

## 2016-02-06 LAB — LIPID PANEL
CHOLESTEROL: 97 mg/dL (ref <200)
HDL: 25 mg/dL — AB (ref >40)
LDL CALC: 51 mg/dL (ref <100)
TRIGLYCERIDES: 103 mg/dL (ref <150)
Total CHOL/HDL Ratio: 3.9 Ratio (ref <5.0)
VLDL: 21 mg/dL (ref <30)

## 2016-02-06 LAB — COMPREHENSIVE METABOLIC PANEL
ALBUMIN: 4 g/dL (ref 3.6–5.1)
ALT: 31 U/L (ref 9–46)
AST: 23 U/L (ref 10–35)
Alkaline Phosphatase: 44 U/L (ref 40–115)
BILIRUBIN TOTAL: 1 mg/dL (ref 0.2–1.2)
BUN: 13 mg/dL (ref 7–25)
CALCIUM: 9.2 mg/dL (ref 8.6–10.3)
CHLORIDE: 104 mmol/L (ref 98–110)
CO2: 26 mmol/L (ref 20–31)
Creat: 1.05 mg/dL (ref 0.70–1.18)
GLUCOSE: 93 mg/dL (ref 70–99)
Potassium: 4.4 mmol/L (ref 3.5–5.3)
SODIUM: 140 mmol/L (ref 135–146)
Total Protein: 6.9 g/dL (ref 6.1–8.1)

## 2016-02-06 NOTE — Assessment & Plan Note (Signed)
Blood pressure is well-controlled or change his medication his fasting labs will be done today. His coronary artery disease also history of A. fib is currently in sinus rhythm he is taking his blood thinners as prescribed. His rate is controlled.  He does have renal episodes of chest discomfort but they only lasts a few sex advised of these increasing needs to see his cardiologist in course he uses nitroglycerin as needed.

## 2016-04-08 ENCOUNTER — Other Ambulatory Visit: Payer: Self-pay | Admitting: Family Medicine

## 2016-05-05 ENCOUNTER — Other Ambulatory Visit: Payer: Self-pay | Admitting: Cardiovascular Disease

## 2016-05-12 ENCOUNTER — Other Ambulatory Visit: Payer: Self-pay | Admitting: Family Medicine

## 2016-05-12 MED ORDER — AMOXICILLIN 500 MG PO CAPS: 500.0000 mg | ORAL_CAPSULE | Freq: Two times a day (BID) | ORAL | 0 refills | Status: DC

## 2016-05-12 MED ORDER — OSELTAMIVIR PHOSPHATE 75 MG PO CAPS: 75.0000 mg | ORAL_CAPSULE | Freq: Two times a day (BID) | ORAL | 0 refills | Status: DC

## 2016-05-12 NOTE — Progress Notes (Signed)
Pt here with Wife who ha influeza B He has been congested, with productive cough, no fever, but fatigued Some nasal pressure drainage as well   Using saline, Coricidan   Will give amox x 1 week, Tamiflu with positive exposure  HEENT- Congested,drainage, MMM RESP- upper airway congestion, no wheeze, normal WOB

## 2016-05-14 IMAGING — MR MR KNEE*L* W/O CM
4 of 6 series · 13 of 40 positions shown · non-contrast
Comparison: None.

CLINICAL DATA: Primary osteoarthritis of left knee LEFT knee pain
for 8 months. Initial encounter.

EXAM:
MRI OF THE LEFT KNEE WITHOUT CONTRAST
TECHNIQUE: Multiplanar, multisequence MR imaging of the knee was performed. No
intravenous contrast was administered.

[Series 3: pdfs axial · axial · 3.0mm · 0.22mm/px · z∈[-44,+28]mm · 3 of 30 slices shown]
[im 5/30]
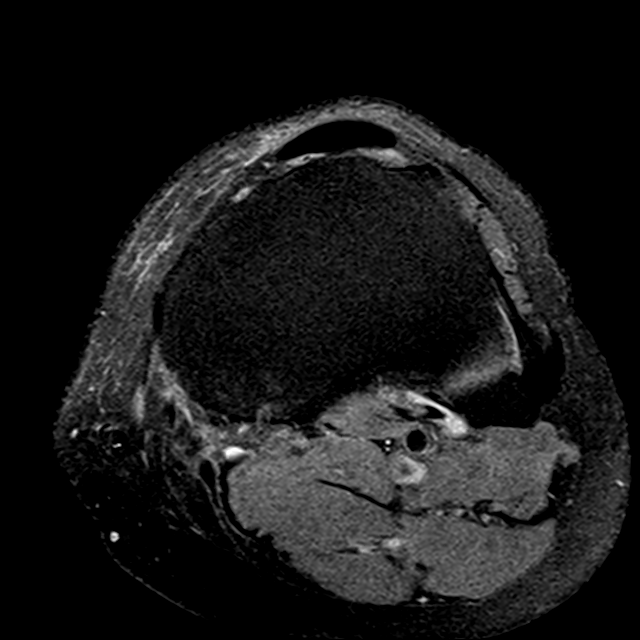
[im 17/30]
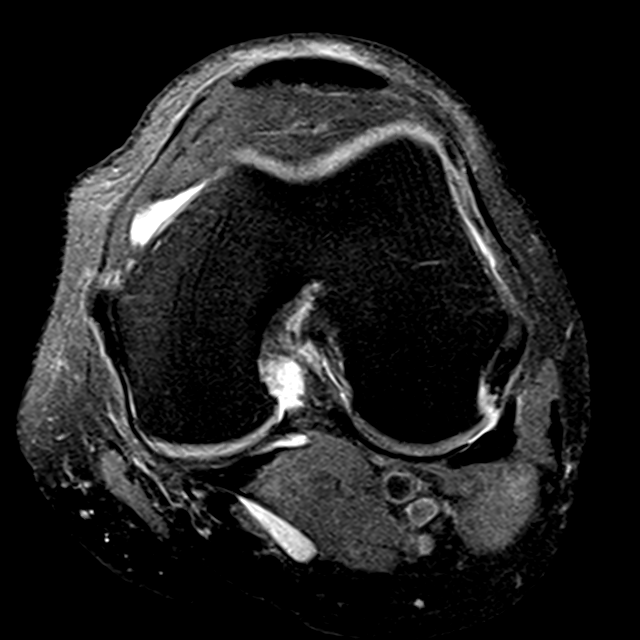
[im 25/30]
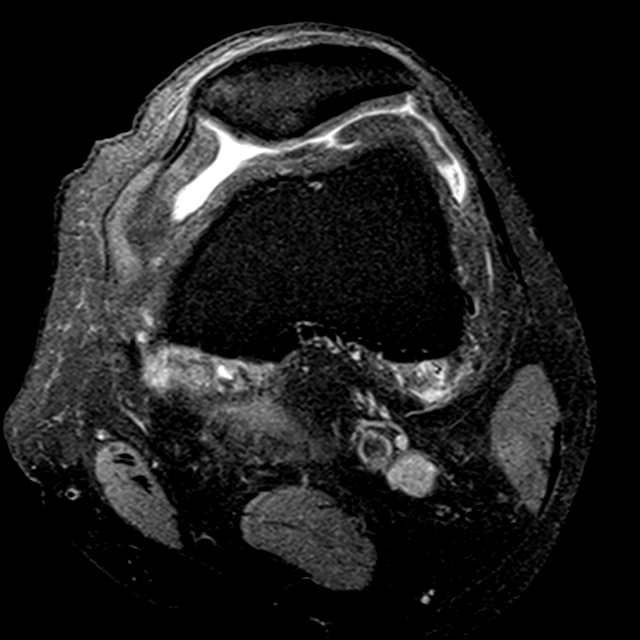

[Series 4: T1 · coronal · 3.0mm · 0.23mm/px · 4 of 28 slices shown]
[im 1/28]
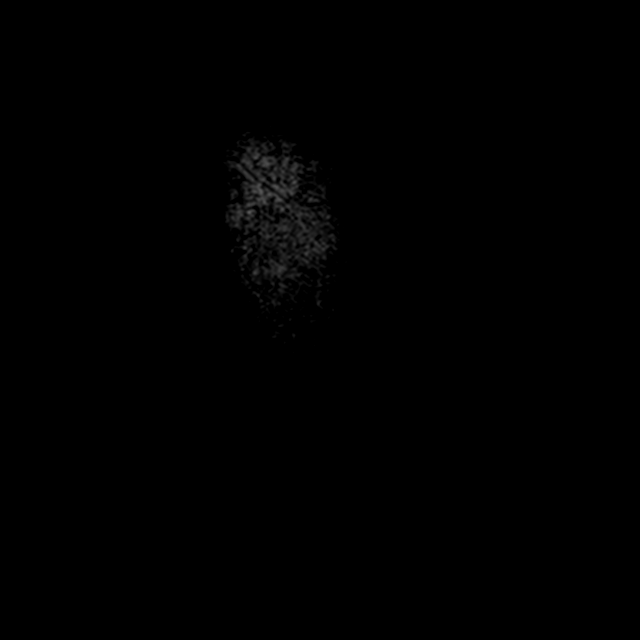
[im 5/28]
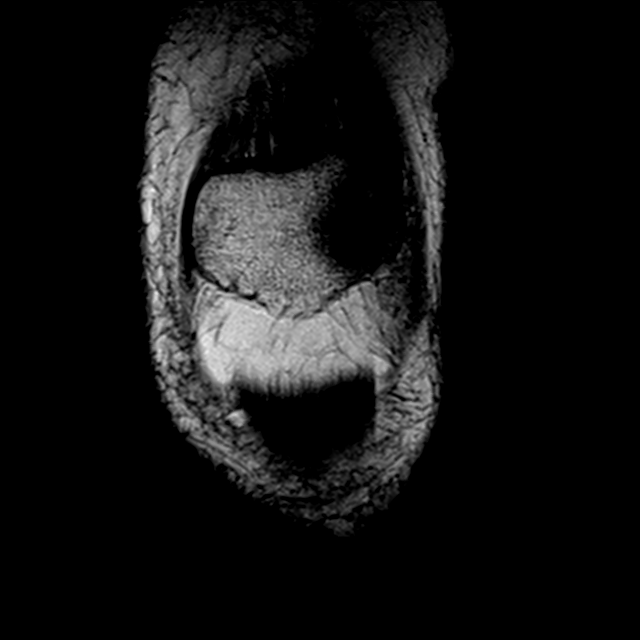
[im 14/28]
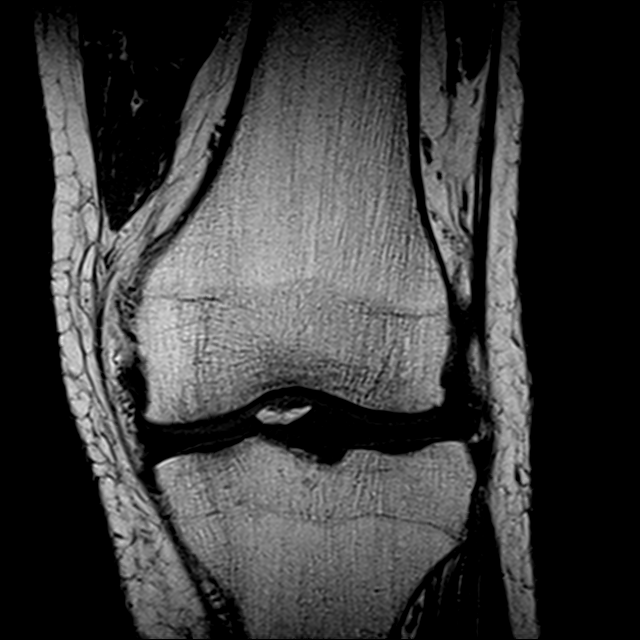
[im 23/28]
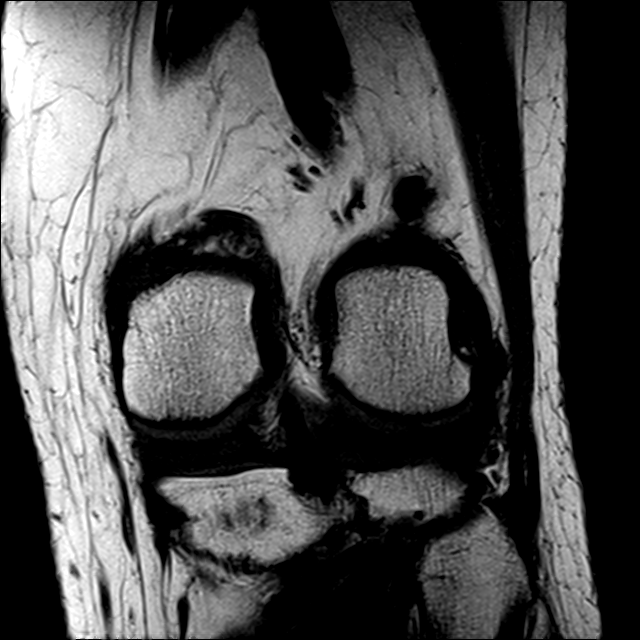

[Series 5: pdfs sag · sagittal · 3.0mm · 0.23mm/px · 3 of 29 slices shown]
[im 5/29]
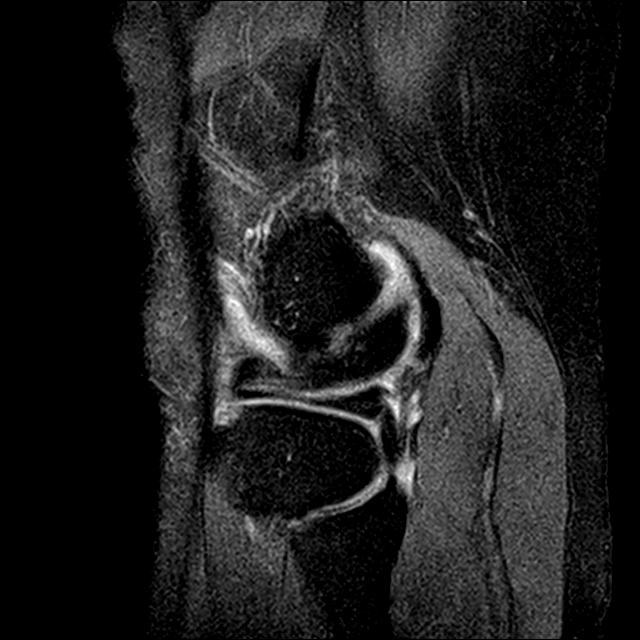
[im 15/29]
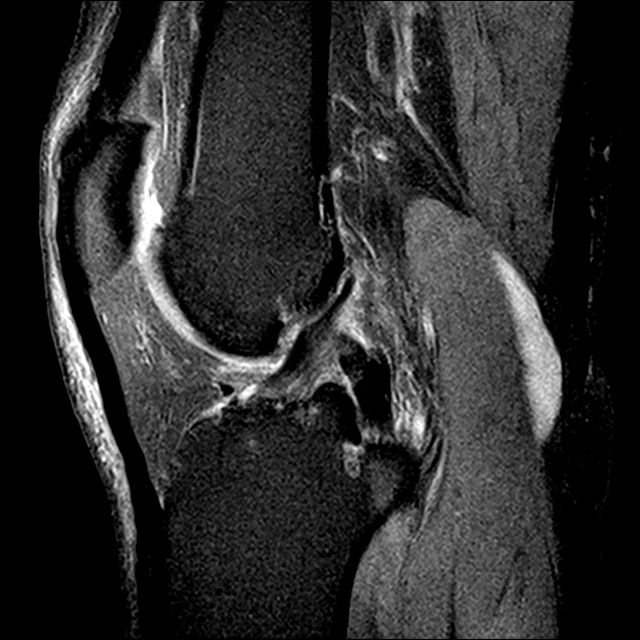
[im 24/29]
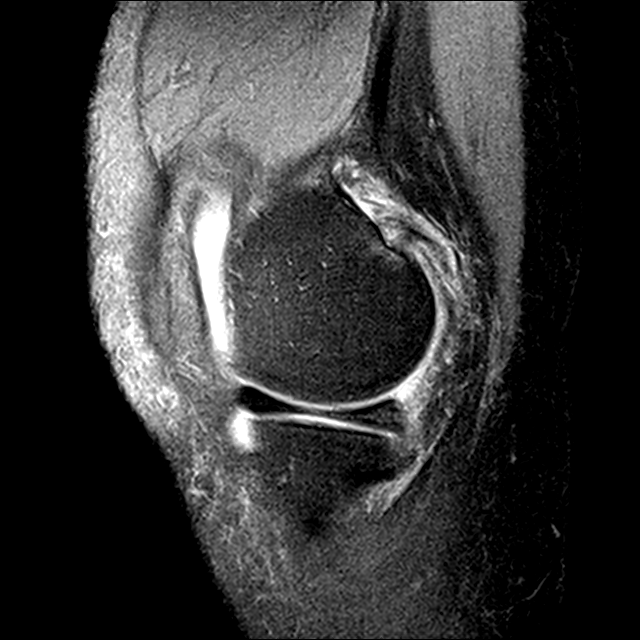

[Series 6: t2fs cor · coronal · 3.0mm · 0.25mm/px · 3 of 28 slices shown]
[im 5/28]
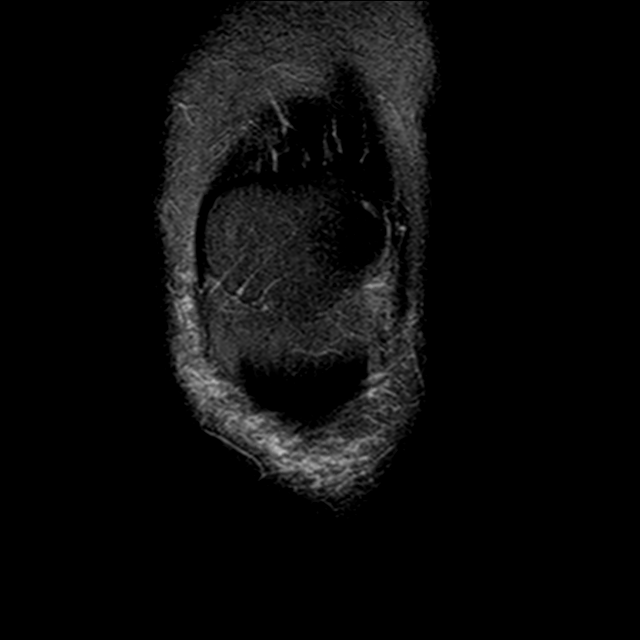
[im 14/28]
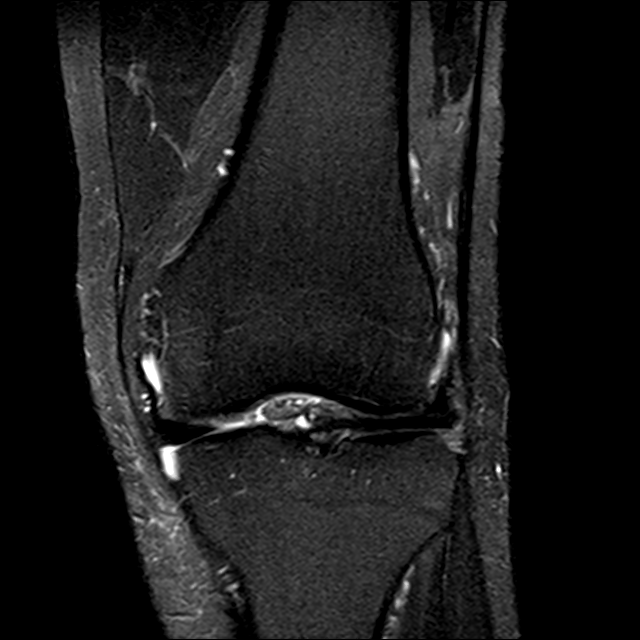
[im 23/28]
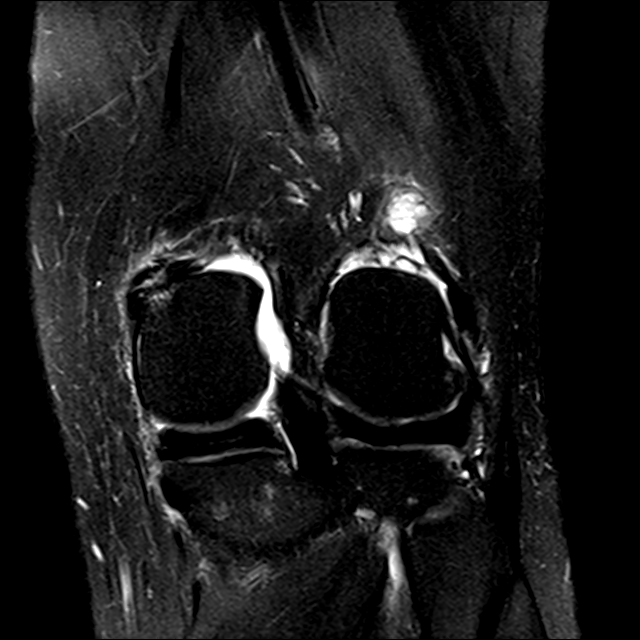

[13 of 40 positions shown; findings below may reference images not displayed]

FINDINGS: MENISCI

Medial meniscus: Age expected degenerative fraying of the free edge.
No tear.

Lateral meniscus: Superior surface horizontal oblique tear in knee
anterior body and extending into the anterior horn and body junction
(image 24 series 5, image 12 series 7). Severe degenerative changes
of the lateral meniscus posterior horn at the root.

LIGAMENTS

Cruciates:  Normal.

Collaterals:  Normal.

CARTILAGE

Patellofemoral: Mild to moderate osteoarthritis. Apical grade III
chondromalacia with fissuring at the superior apex and small
subchondral cysts.

Medial:  Normal.

Lateral: Mild osteoarthritis. Grade III chondromalacia of the tibial
plateau weight-bearing surface.

Joint:  Moderate effusion.

Popliteal Fossa: Small Baker's cyst tracking cranially in the
popliteal fossa.

Extensor Mechanism:  Intact.

Bones: Cystic changes are present in the posterior medial tibial
plateau, most compatible with an intraosseous ganglion and probably
arising from the meniscal root or PCL attachment on the tibial
plateau.
IMPRESSION: 1. Small horizontal tear at the anterior horn and body junction of
the lateral meniscus.
2. Mild lateral and patellofemoral compartment osteoarthritis.
3. Moderate effusion and small Baker cyst.

## 2016-06-29 ENCOUNTER — Other Ambulatory Visit: Payer: Self-pay | Admitting: Family Medicine

## 2016-07-23 ENCOUNTER — Encounter: Payer: Self-pay | Admitting: Cardiovascular Disease

## 2016-07-23 ENCOUNTER — Ambulatory Visit (INDEPENDENT_AMBULATORY_CARE_PROVIDER_SITE_OTHER): Payer: Medicare Other | Admitting: Cardiovascular Disease

## 2016-07-23 VITALS — BP 124/87 | HR 52 | Ht 71.0 in | Wt 226.0 lb

## 2016-07-23 DIAGNOSIS — I480 Paroxysmal atrial fibrillation: Secondary | ICD-10-CM | POA: Diagnosis not present

## 2016-07-23 DIAGNOSIS — I25118 Atherosclerotic heart disease of native coronary artery with other forms of angina pectoris: Secondary | ICD-10-CM

## 2016-07-23 DIAGNOSIS — I10 Essential (primary) hypertension: Secondary | ICD-10-CM | POA: Diagnosis not present

## 2016-07-23 DIAGNOSIS — E785 Hyperlipidemia, unspecified: Secondary | ICD-10-CM | POA: Diagnosis not present

## 2016-07-23 DIAGNOSIS — Z955 Presence of coronary angioplasty implant and graft: Secondary | ICD-10-CM | POA: Diagnosis not present

## 2016-07-23 NOTE — Progress Notes (Signed)
SUBJECTIVE: The patient presents for follow-up of coronary artery disease and atrial fibrillation. Nuclear stress test in 11/2014 was low risk but did demonstrate inferior wall ischemia. He did not tolerate Ranexa in the past.  He is here with his wife. He has occasional chest pains. He denies palpitations. He does some gardening. He and his wife would like to walk more.  They're going through a very difficult time. The son is going through a divorce after having been married for nearly 25 years. They have lost contact with their grandchildren. They have a strong faith. They plan to go to their house on [place] later this summer.   Review of Systems: As per "subjective", otherwise negative.  Allergies  Allergen Reactions  . Sulfa Antibiotics Rash    Current Outpatient Prescriptions  Medication Sig Dispense Refill  . aspirin EC 81 MG tablet Take 81 mg by mouth daily.    Marland Kitchen atenolol (TENORMIN) 50 MG tablet TAKE 1 TABLET BY MOUTH ONCE DAILY 90 tablet 0  . atorvastatin (LIPITOR) 10 MG tablet Take 1 tablet (10 mg total) by mouth daily. 90 tablet 3  . ELIQUIS 5 MG TABS tablet TAKE ONE TABLET BY MOUTH TWICE DAILY 180 tablet 3  . Multiple Vitamin (MULTIVITAMIN) capsule Take 1 capsule by mouth daily.    . nitroGLYCERIN (NITROSTAT) 0.4 MG SL tablet Place 1 tablet (0.4 mg total) under the tongue every 5 (five) minutes x 3 doses as needed for chest pain. 30 tablet 3  . Omega 3 1000 MG CAPS Take 2,000 mg by mouth 2 (two) times daily.    . sertraline (ZOLOFT) 100 MG tablet TAKE ONE TABLET BY MOUTH ONCE DAILY 90 tablet 1  . vitamin E (VITAMIN E) 400 UNIT capsule Take 800 Units by mouth daily.     No current facility-administered medications for this visit.     Past Medical History:  Diagnosis Date  . Acute meniscal tear of left knee   . Anginal pain ([place])    comes and goes; uses rest to relieve   . Anticoagulant long-term use   . Anxiety   . At risk for sleep apnea    STOP-BANG= 4        SENT TO PCP 05-04-2014  . Atrial fibrillation [facility])    new on-set Jan 2016--  with good rate control on atenolol  per cardiologist note Dr Bronson Ing 04-09-2014  . CAD, multiple vessel cardiologist--  dr Bronson Ing   3 vessel CAD //  s/p DES x1 to ostial RCA 06-17-2012  . Depression   . Dysrhythmia   . History of adenomatous polyp of colon    2009  . Hyperlipidemia   . Hypertension   . OA (osteoarthritis) of knee   . S/P drug eluting coronary stent placement    RCA x1  06-17-2012  . Shortness of breath dyspnea    with exercise     Past Surgical History:  Procedure Laterality Date  . CARDIAC CATHETERIZATION  06-15-2012   dr Angelena Form   Triple vessel CAD with severe ostial RCA stenosis, severe stenosis in the small caliber diagonal branch and small caliber OM1, OM2 branches/  preserved LVSF  . COLONOSCOPY  last one 07-01-2010   Dr. Madolyn Frieze. mild diveticulosis of left colon, small internal hemorrhoids. next tcs in 5 years due to h/o colon polyps.  . COLONOSCOPY N/A 12/18/2015   Procedure: COLONOSCOPY;  Surgeon: Daneil Dolin, MD;  [facility];  Service: Endoscopy;  Laterality: N/A;  1:30 pm  . KNEE ARTHROSCOPY Right 2005  (approx)  . KNEE ARTHROSCOPY Left 05/08/2014   Procedure: LEFT ARTHROSCOPY KNEE WIGHT DEBRIDEMENT PARTIAL LATERAL MENISCECTOMY AND CHONDROPLASTY;  Surgeon: Sydnee Cabal, MD;  Location: [place];  Service: Orthopedics;  Laterality: Left;  . LAPAROSCOPIC CHOLECYSTECTOMY  2011   AND REPAIR UMBILICAL HERNIA  . LEFT ELBOW SURGERY  1969  . PERCUTANEOUS CORONARY STENT INTERVENTION (PCI-S) N/A 06/17/2012   Procedure: PERCUTANEOUS CORONARY STENT INTERVENTION (PCI-S);  Surgeon: Burnell Blanks, MD;  Location: [facility] CATH LAB;  Service: Cardiovascular;  Laterality: N/A;  DES x1 ostial RCA  . SHOULDER ARTHROSCOPY WITH ROTATOR CUFF REPAIR Bilateral x2 left//   x1 right (last one , left in 2001)  . TONSILLECTOMY  1970  . TOTAL HIP  ARTHROPLASTY Right 05-25-2006  . TOTAL KNEE ARTHROPLASTY Left 01/03/2015   Procedure: TOTAL KNEE ARTHROPLASTY;  Surgeon: Sydnee Cabal, MD;  [facility];  Service: Orthopedics;  Laterality: Left;  . TRANSTHORACIC ECHOCARDIOGRAM  02-08-2014   mild focal basal hypertrophy of the septum, ef 55-60%,  indeterminate diastolic function in setting of atrial fib./  possibly bicuspid AV, mild calcification without stenosis, trivial AR/  mild MR and PR/  moderate to severe LAE/  trivial PR/  moderate RAE  . trauma to left finger     partially amputation secondary to lawnmower accident   . VASECTOMY  1970'sj  w/  general anesthesia    Social History   Social History  . Marital status: Married    Spouse name: N/A  . Number of children: N/A  . Years of education: N/A   Occupational History  . Not on file.   Social History Main Topics  . Smoking status: Former Smoker    Packs/day: 0.50    Years: 8.00    Types: Cigarettes    Start date: 02/02/1959    Quit date: 01/27/1968  . Smokeless tobacco: Former Systems developer    Types: Chew    Quit date: 01/26/1970  . Alcohol use No     Comment: RARE  . Drug use: No  . Sexual activity: Not on file   Other Topics Concern  . Not on file   Social History Narrative   [employer], retired     Vitals:   07/23/16 0837  BP: 124/87  Pulse: (!) 52  Weight: 226 lb (102.5 kg)  Height: 5\' 11"  (1.803 m)    Wt Readings from Last 3 Encounters:  07/23/16 226 lb (102.5 kg)  02/05/16 224 lb (101.6 kg)  01/23/16 227 lb (103 kg)     PHYSICAL EXAM General: NAD HEENT: Normal. Neck: No JVD, no thyromegaly. Lungs: Clear to auscultation bilaterally with normal respiratory effort. CV: Nondisplaced PMI.  Regular rate and rhythm, normal S1/S2, no S3/S4, no murmur. No pretibial or periankle edema.  No carotid bruit.   Abdomen: Soft, nontender, protuberant.  Neurologic: Alert and oriented.  Psych: Normal affect. Skin: Normal. Musculoskeletal: No gross  deformities.    ECG: Most recent ECG reviewed.   Labs: Lab Results  Component Value Date/Time   K 4.4 02/05/2016 09:37 AM   BUN 13 02/05/2016 09:37 AM   CREATININE 1.05 02/05/2016 09:37 AM   ALT 31 02/05/2016 09:37 AM   TSH 1.279 01/31/2014 12:11 PM   HGB 15.3 02/05/2016 09:37 AM     Lipids: Lab Results  Component Value Date/Time   LDLCALC 51 02/05/2016 09:37 AM   CHOL 97 02/05/2016 09:37 AM   TRIG  103 02/05/2016 09:37 AM   HDL 25 (L) 02/05/2016 09:37 AM       ASSESSMENT AND PLAN: 1. CAD with RCA stent and residual disease in small caliber diagonal and obtuse marginal branches:Symptomatically stable. Continue aspirin, atenolol, and Lipitor.  2. Atrial fibrillation: Heart rate is well controlled on atenolol 50 mg daily. Continue Eliquis for anticoagulation.  3. Essential HTN: Controlled. No changes.  4. Hyperlipidemia: Continue Lipitor 10 mg. I will obtain copy of lipids for review.  5. Possible bicuspid aortic valve: No need for additional imaging at this time. No murmur on physical exam. I would consider repeat echocardiography in the future when deemed clinically indicated.     Disposition: Follow up 6 months.  Kate Sable, M.D., F.A.C.C.

## 2016-07-23 NOTE — Patient Instructions (Signed)
Your physician wants you to follow-up in:  6 months with Dr.Koneswaran You will receive a reminder letter in the mail two months in advance. If you don't receive a letter, please call our office to schedule the follow-up appointment.    Your physician recommends that you continue on your current medications as directed. Please refer to the Current Medication list given to you today.    If you need a refill on your cardiac medications before your next appointment, please call your pharmacy.     No testing ordered today.    Thank you for choosing [facility] !        

## 2016-08-10 ENCOUNTER — Other Ambulatory Visit: Payer: Self-pay | Admitting: Family Medicine

## 2016-08-10 NOTE — Telephone Encounter (Signed)
Refill appropriate and filled per protocol. 

## 2016-08-18 ENCOUNTER — Encounter: Payer: Medicare Other | Admitting: Family Medicine

## 2016-08-24 ENCOUNTER — Encounter: Payer: Self-pay | Admitting: Family Medicine

## 2016-09-17 ENCOUNTER — Other Ambulatory Visit: Payer: Self-pay | Admitting: Family Medicine

## 2016-12-29 ENCOUNTER — Other Ambulatory Visit: Payer: Self-pay | Admitting: Family Medicine

## 2016-12-29 MED ORDER — CIPROFLOXACIN HCL 500 MG PO TABS: 500.0000 mg | ORAL_TABLET | Freq: Two times a day (BID) | ORAL | 0 refills | Status: DC

## 2016-12-29 NOTE — Progress Notes (Signed)
Request [employer] for travel to [place], in case of travelers diarrhea

## 2017-01-29 ENCOUNTER — Ambulatory Visit: Payer: Self-pay | Admitting: Cardiovascular Disease

## 2017-02-03 ENCOUNTER — Encounter: Payer: Self-pay | Admitting: Family Medicine

## 2017-02-18 IMAGING — US US EXTREM LOW VENOUS*L*
1 series · 13 of 24 positions shown · non-contrast
Comparison: None.

CLINICAL DATA: Status post total knee arthroplasty 1 month prior
with persistent pain and swelling left lower extremity

EXAM:
LEFT LOWER EXTREMITY VENOUS DUPLEX ULTRASOUND
TECHNIQUE: Gray-scale sonography with graded compression, as well as color
Doppler and duplex ultrasound were performed to evaluate the left
lower extremity deep venous system from the level of the common
femoral vein and including the common femoral, femoral, profunda
femoral, popliteal and calf veins including the posterior tibial,
peroneal and gastrocnemius veins when visible. The superficial great
saphenous vein was also interrogated. Spectral Doppler was utilized
to evaluate flow at rest and with distal augmentation maneuvers in
the common femoral, femoral and popliteal veins.

[Series 1: us extrem low venous*left* · 0.09mm/px · 13 of 35 slices shown]
[im 1/35]
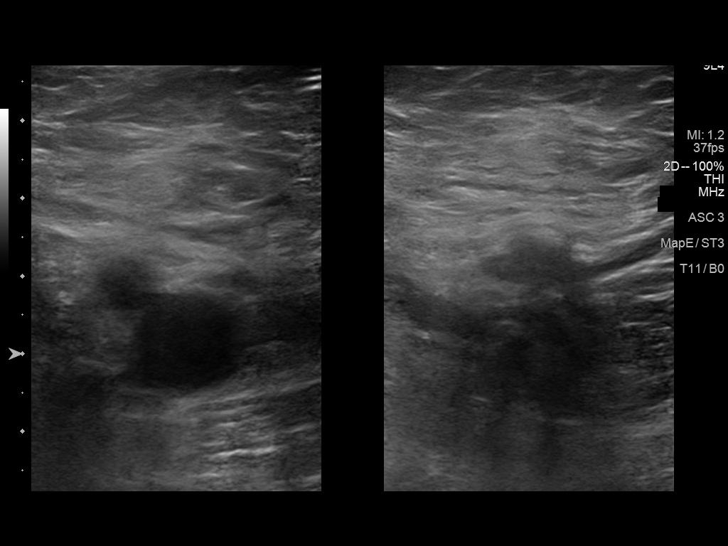
[im 3/35]
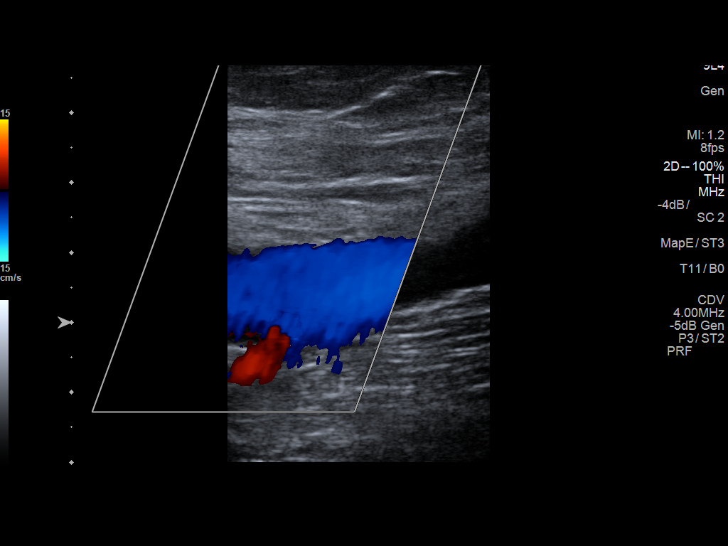
[im 6/35]
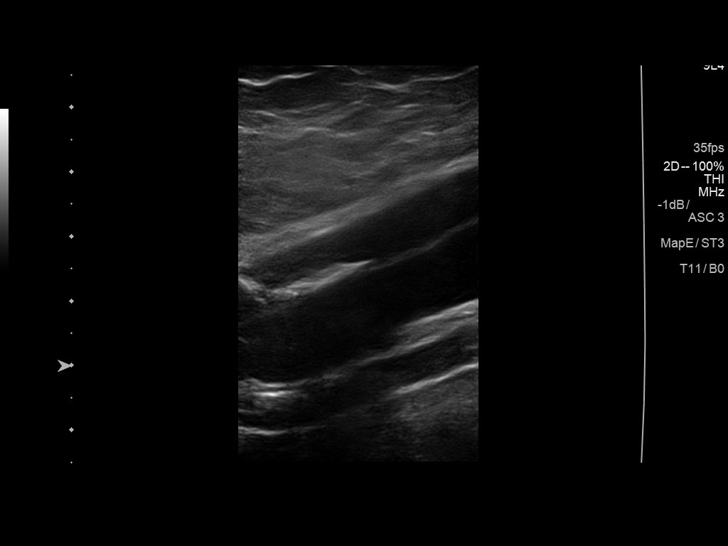
[im 9/35]
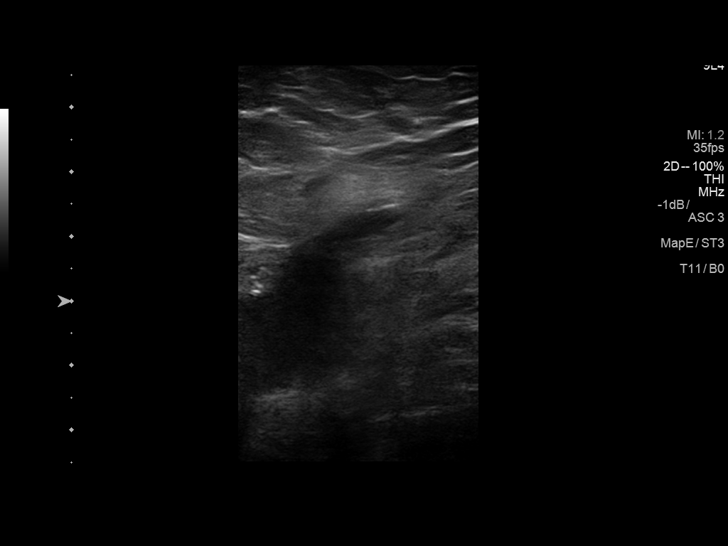
[im 12/35]
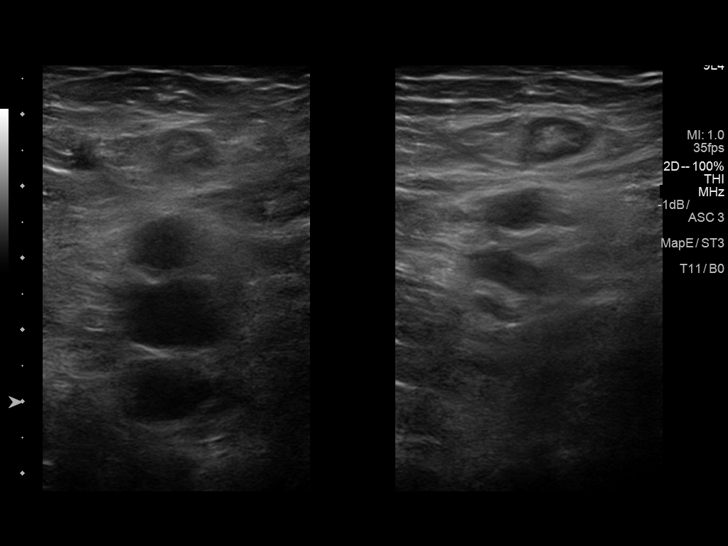
[im 15/35]
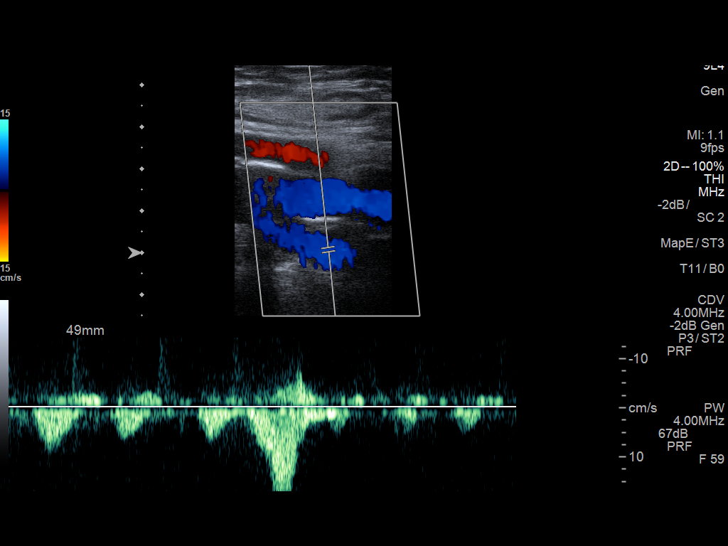
[im 18/35]
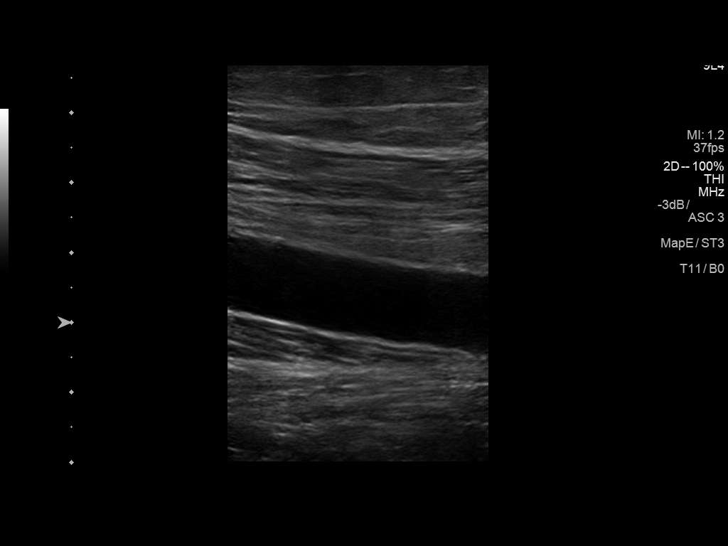
[im 20/35]
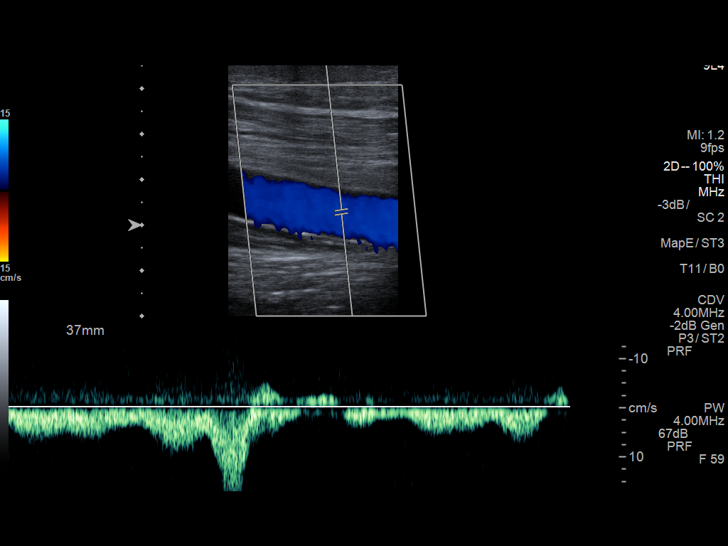
[im 23/35]
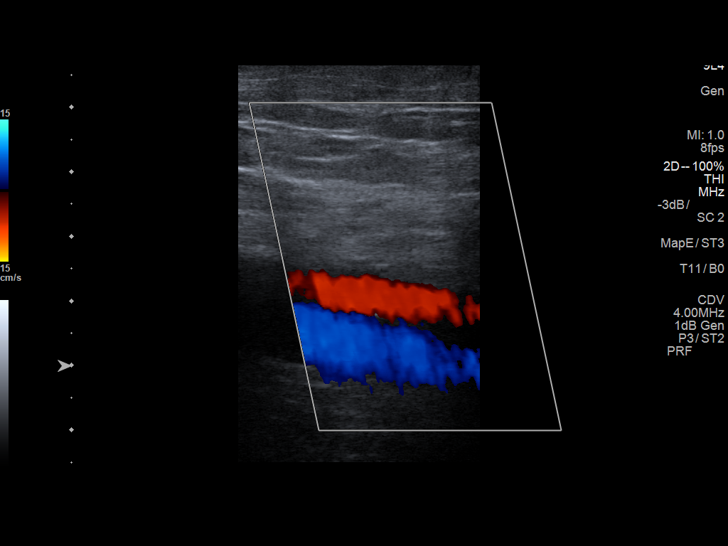
[im 26/35]
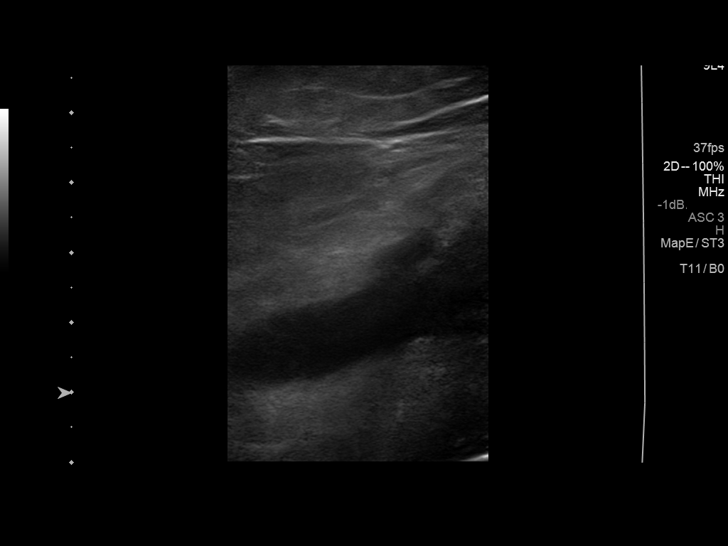
[im 29/35]
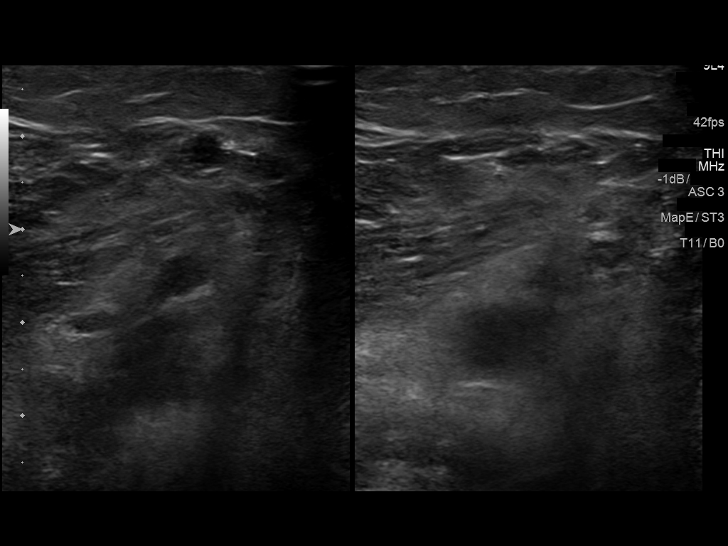
[im 32/35]
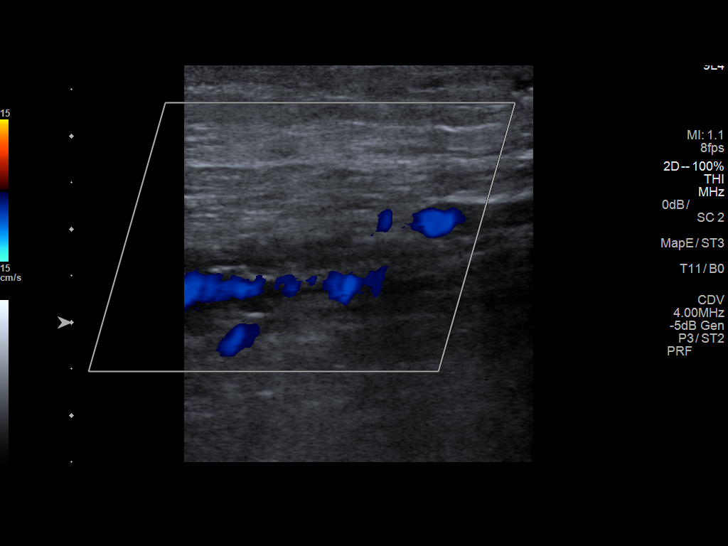
[im 35/35]
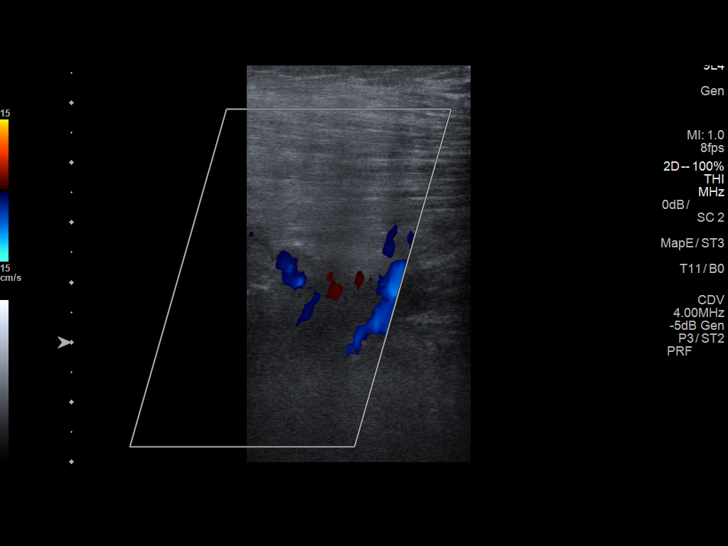

[13 of 24 positions shown; findings below may reference images not displayed]

FINDINGS: Contralateral Common Femoral Vein: Respiratory phasicity is normal
and symmetric with the symptomatic side. No evidence of thrombus.
Normal compressibility.

Common Femoral Vein: No evidence of thrombus. Normal
compressibility, respiratory phasicity and response to augmentation.

Saphenofemoral Junction: No evidence of thrombus. Normal
compressibility and flow on color Doppler imaging.

Profunda Femoral Vein: No evidence of thrombus. Normal
compressibility and flow on color Doppler imaging.

Femoral Vein: No evidence of thrombus. Normal compressibility,
respiratory phasicity and response to augmentation.

Popliteal Vein: No evidence of thrombus. Normal compressibility,
respiratory phasicity and response to augmentation.

Calf Veins: No evidence of thrombus. Normal compressibility and flow
on color Doppler imaging.

Superficial Great Saphenous Vein: No evidence of thrombus. Normal
compressibility and flow on color Doppler imaging.

Venous Reflux:  None.

Other Findings:  None.
IMPRESSION: No evidence of left lower extremity deep venous thrombosis. Right
common femoral vein also patent.

## 2017-03-02 ENCOUNTER — Ambulatory Visit: Payer: Medicare Other | Admitting: Cardiovascular Disease

## 2017-03-02 ENCOUNTER — Encounter: Payer: Self-pay | Admitting: Cardiovascular Disease

## 2017-03-02 VITALS — BP 108/64 | HR 74 | Ht 71.0 in | Wt 237.0 lb

## 2017-03-02 DIAGNOSIS — I481 Persistent atrial fibrillation: Secondary | ICD-10-CM

## 2017-03-02 DIAGNOSIS — E785 Hyperlipidemia, unspecified: Secondary | ICD-10-CM

## 2017-03-02 DIAGNOSIS — I25118 Atherosclerotic heart disease of native coronary artery with other forms of angina pectoris: Secondary | ICD-10-CM

## 2017-03-02 DIAGNOSIS — R079 Chest pain, unspecified: Secondary | ICD-10-CM | POA: Diagnosis not present

## 2017-03-02 DIAGNOSIS — I10 Essential (primary) hypertension: Secondary | ICD-10-CM

## 2017-03-02 DIAGNOSIS — Z955 Presence of coronary angioplasty implant and graft: Secondary | ICD-10-CM | POA: Diagnosis not present

## 2017-03-02 DIAGNOSIS — I4819 Other persistent atrial fibrillation: Secondary | ICD-10-CM

## 2017-03-02 MED ORDER — NITROGLYCERIN 0.4 MG SL SUBL: 0.4000 mg | SUBLINGUAL_TABLET | SUBLINGUAL | 3 refills | Status: DC | PRN

## 2017-03-02 MED ORDER — ISOSORBIDE MONONITRATE ER 30 MG PO TB24: 30.0000 mg | ORAL_TABLET | Freq: Every day | ORAL | 1 refills | Status: DC

## 2017-03-02 NOTE — Progress Notes (Signed)
SUBJECTIVE: The patient presents for follow-up of coronary artery disease and atrial fibrillation. Nuclear stress test in 11/2014 was low risk but did demonstrate inferior wall ischemia. He did not tolerate Ranexa in the past.  At his last visit, he and his wife told me about their son going through a divorce after they had been married for nearly 58 years.  They were both very disturbed by this.  They had lost contact with her grandchildren.  They have a house on [place] which they spend time at.  They recently returned from a 9-day cruise to [place] and [place].  He developed a chest cold shortly before disembarking.  He put himself preemptively on ciprofloxacin and then took 2 tablets of amoxicillin in the past 2 days.  His wife thinks he had a low-grade fever.  He has been experiencing more frequent episodes of chest pains which he describes as "zingers" which can occur both with and without exertion.  There is some associated shortness of breath.  He also describes bilateral leg fatigue with exertion.  I reviewed his coronary angiogram dated 06/15/12.  At that time when he underwent stenting of the RCA, he had a mid LAD 30% stenosis.  A small diagonal branch had a 99% stenosis.  The mid RCA had a 30-40% stenosis.  He had an 80% stenosis in a small first obtuse marginal branch and a 99% stenosis in a small second obtuse marginal branch.  I reviewed his ECG performed today which demonstrates atrial fibrillation, 75 bpm.  Review of Systems: As per "subjective", otherwise negative.  Allergies  Allergen Reactions  . Sulfa Antibiotics Rash    Current Outpatient Medications  Medication Sig Dispense Refill  . aspirin EC 81 MG tablet Take 81 mg by mouth daily.    Marland Kitchen atenolol (TENORMIN) 50 MG tablet TAKE 1 TABLET BY MOUTH ONCE DAILY 90 tablet 0  . atorvastatin (LIPITOR) 10 MG tablet TAKE ONE TABLET BY MOUTH ONCE DAILY 90 tablet 3  . ELIQUIS 5 MG TABS tablet TAKE ONE TABLET BY MOUTH  TWICE DAILY 180 tablet 3  . Multiple Vitamin (MULTIVITAMIN) capsule Take 1 capsule by mouth daily.    . nitroGLYCERIN (NITROSTAT) 0.4 MG SL tablet Place 1 tablet (0.4 mg total) under the tongue every 5 (five) minutes x 3 doses as needed for chest pain. 30 tablet 3  . Omega 3 1000 MG CAPS Take 2,000 mg by mouth 2 (two) times daily.    . sertraline (ZOLOFT) 100 MG tablet TAKE ONE TABLET BY MOUTH ONCE DAILY 90 tablet 1  . vitamin E (VITAMIN E) 400 UNIT capsule Take 800 Units by mouth daily.     No current facility-administered medications for this visit.     Past Medical History:  Diagnosis Date  . Acute meniscal tear of left knee   . Anginal pain ([place])    comes and goes; uses rest to relieve   . Anticoagulant long-term use   . Anxiety   . At risk for sleep apnea    STOP-BANG= 4       SENT TO PCP 05-04-2014  . Atrial fibrillation [facility])    new on-set Jan 2016--  with good rate control on atenolol  per cardiologist note Dr Bronson Ing 04-09-2014  . CAD, multiple vessel cardiologist--  dr Bronson Ing   3 vessel CAD //  s/p DES x1 to ostial RCA 06-17-2012  . Depression   . Dysrhythmia   . History of adenomatous polyp of  colon    2009  . Hyperlipidemia   . Hypertension   . OA (osteoarthritis) of knee   . S/P drug eluting coronary stent placement    RCA x1  06-17-2012  . Shortness of breath dyspnea    with exercise     Past Surgical History:  Procedure Laterality Date  . CARDIAC CATHETERIZATION  06-15-2012   dr Angelena Form   Triple vessel CAD with severe ostial RCA stenosis, severe stenosis in the small caliber diagonal branch and small caliber OM1, OM2 branches/  preserved LVSF  . COLONOSCOPY  last one 07-01-2010   Dr. Madolyn Frieze. mild diveticulosis of left colon, small internal hemorrhoids. next tcs in 5 years due to h/o colon polyps.  . COLONOSCOPY N/A 12/18/2015   Procedure: COLONOSCOPY;  Surgeon: Daneil Dolin, MD;  [facility];  Service: Endoscopy;  Laterality: N/A;   1:30 pm  . KNEE ARTHROSCOPY Right 2005  (approx)  . KNEE ARTHROSCOPY Left 05/08/2014   Procedure: LEFT ARTHROSCOPY KNEE WIGHT DEBRIDEMENT PARTIAL LATERAL MENISCECTOMY AND CHONDROPLASTY;  Surgeon: Sydnee Cabal, MD;  Location: [place];  Service: Orthopedics;  Laterality: Left;  . LAPAROSCOPIC CHOLECYSTECTOMY  2011   AND REPAIR UMBILICAL HERNIA  . LEFT ELBOW SURGERY  1969  . PERCUTANEOUS CORONARY STENT INTERVENTION (PCI-S) N/A 06/17/2012   Procedure: PERCUTANEOUS CORONARY STENT INTERVENTION (PCI-S);  Surgeon: Burnell Blanks, MD;  Location: [facility] CATH LAB;  Service: Cardiovascular;  Laterality: N/A;  DES x1 ostial RCA  . SHOULDER ARTHROSCOPY WITH ROTATOR CUFF REPAIR Bilateral x2 left//   x1 right (last one , left in 2001)  . TONSILLECTOMY  1970  . TOTAL HIP ARTHROPLASTY Right 05-25-2006  . TOTAL KNEE ARTHROPLASTY Left 01/03/2015   Procedure: TOTAL KNEE ARTHROPLASTY;  Surgeon: Sydnee Cabal, MD;  [facility];  Service: Orthopedics;  Laterality: Left;  . TRANSTHORACIC ECHOCARDIOGRAM  02-08-2014   mild focal basal hypertrophy of the septum, ef 55-60%,  indeterminate diastolic function in setting of atrial fib./  possibly bicuspid AV, mild calcification without stenosis, trivial AR/  mild MR and PR/  moderate to severe LAE/  trivial PR/  moderate RAE  . trauma to left finger     partially amputation secondary to lawnmower accident   . VASECTOMY  1970'sj  w/  general anesthesia    Social History   Socioeconomic History  . Marital status: Married    Spouse name: Not on file  . Number of children: Not on file  . Years of education: Not on file  . Highest education level: Not on file  Social Needs  . Financial resource strain: Not on file  . Food insecurity - worry: Not on file  . Food insecurity - inability: Not on file  . Transportation needs - medical: Not on file  . Transportation needs - non-medical: Not on file  Occupational History  . Not on file  Tobacco  Use  . Smoking status: Former Smoker    Packs/day: 0.50    Years: 8.00    Pack years: 4.00    Types: Cigarettes    Start date: 02/02/1959    Last attempt to quit: 01/27/1968    Years since quitting: 49.1  . Smokeless tobacco: Former Systems developer    Types: [place] date: 01/26/1970  Substance and Sexual Activity  . Alcohol use: No    Alcohol/week: 0.0 oz    Comment: RARE  . Drug use: No  . Sexual activity: Not on file  Other Topics Concern  . Not on file  Social History Narrative   [employer], retired     Vitals:   03/02/17 1307  BP: 108/64  Pulse: 74  SpO2: 97%  Weight: 237 lb (107.5 kg)  Height: 5\' 11"  (1.803 m)    Wt Readings from Last 3 Encounters:  03/02/17 237 lb (107.5 kg)  07/23/16 226 lb (102.5 kg)  02/05/16 224 lb (101.6 kg)     PHYSICAL EXAM General: NAD HEENT: Normal. Neck: No JVD, no thyromegaly. Lungs: Faint scattered end-expiratory wheezes b/l. CV: Regular rate and irregular rhythm, normal S1/S2, no S3, no murmur. No pretibial or periankle edema.  No carotid bruit.   Abdomen: Soft, nontender, no distention.  Neurologic: Alert and oriented.  Psych: Normal affect. Skin: Normal. Musculoskeletal: No gross deformities.    ECG: Most recent ECG reviewed.   Labs: Lab Results  Component Value Date/Time   K 4.4 02/05/2016 09:37 AM   BUN 13 02/05/2016 09:37 AM   CREATININE 1.05 02/05/2016 09:37 AM   ALT 31 02/05/2016 09:37 AM   TSH 1.279 01/31/2014 12:11 PM   HGB 15.3 02/05/2016 09:37 AM     Lipids: Lab Results  Component Value Date/Time   LDLCALC 51 02/05/2016 09:37 AM   CHOL 97 02/05/2016 09:37 AM   TRIG 103 02/05/2016 09:37 AM   HDL 25 (L) 02/05/2016 09:37 AM       ASSESSMENT AND PLAN:  1. CAD with RCA stent and residual disease in small caliber diagonal and obtuse marginal branches with chest pains: Chest pains now appear to be slightly more frequent and severe in intensity.  I will obtain a CT of the coronary vessels and  fractional flow reserve if needed.  I will start Imdur 30 mg daily.  Continue aspirin, atenolol, and Lipitor.  I will also refilled sublingual nitroglycerin.  2. Long-standing persistent trial fibrillation: Heart rate is well controlled on atenolol 50 mg daily. Continue Eliquis for anticoagulation.  3. Essential HTN: Controlled.  I will monitor given the addition of Imdur.  4. Hyperlipidemia: Continue Lipitor 10 mg.   LDL 51 on 02/05/16.  Lipids are due to be repeated.  5. Possible bicuspid aortic valve: No need for additional imaging at this time. No murmur on physical exam. I would consider repeat echocardiography in the future when deemed clinically indicated.     Disposition: Follow up 6 weeks   Kate Sable, M.D., F.A.C.C.

## 2017-03-02 NOTE — Patient Instructions (Signed)
Please arrive at the [facility] main entrance of [facility] at xx:xx AM (30-45 minutes prior to test start time)  [facility] [address] [place], [place] [address] [phone]  Proceed to the [facility] Radiology Department (First Floor).  Please follow these instructions carefully (unless otherwise directed):  Hold all erectile dysfunction medications at least 48 hours prior to test.  On the Night Before the Test: . Drink plenty of water. . Do not consume any caffeinated/decaffeinated beverages or chocolate 12 hours prior to your test. . Do not take any antihistamines 12 hours prior to your test. . If you take Metformin do not take 24 hours prior to test. . If the patient has contrast allergy: ? Patient will need a prescription for Prednisone and very clear instructions (as follows): 1. Prednisone 50 mg - take 13 hours prior to test 2. Take another Prednisone 50 mg 7 hours prior to test 3. Take another Prednisone 50 mg 1 hour prior to test 4. Take Benadryl 50 mg 1 hour prior to test . Patient must complete all four doses of above prophylactic medications. . Patient will need a ride after test due to Benadryl.  On the Day of the Test: . Drink plenty of water. Do not drink any water within one hour of the test. . Do not eat any food 4 hours prior to the test. . You may take your regular medications prior to the test. . IF NOT ON A BETA BLOCKER - Take 50 mg of lopressor (metoprolol) one hour before the test. . HOLD Furosemide morning of the test.  After the Test: . Drink plenty of water. . After receiving IV contrast, you may experience a mild flushed feeling. This is normal. . On occasion, you may experience a mild rash up to 24 hours after the test. This is not dangerous. If this occurs, you can take Benadryl 25 mg and increase your fluid intake. . If you experience trouble breathing, this can be serious. If it is severe call 911 IMMEDIATELY. If it  is mild, please call our office. . If you take any of these medications: Glipizide/Metformin, Avandament, Glucavance, please do not take 48 hours after completing test.     START Imdur 30 mg daily   I refilled your nitroglycerine   Follow up with Dr.Koneswaran in 6 weeks

## 2017-03-03 ENCOUNTER — Ambulatory Visit (INDEPENDENT_AMBULATORY_CARE_PROVIDER_SITE_OTHER): Payer: Medicare Other | Admitting: Physician Assistant

## 2017-03-03 ENCOUNTER — Encounter: Payer: Self-pay | Admitting: Physician Assistant

## 2017-03-03 ENCOUNTER — Other Ambulatory Visit: Payer: Self-pay

## 2017-03-03 VITALS — BP 102/68 | HR 96 | Temp 98.3°F | Resp 14 | Ht 71.0 in | Wt 235.6 lb

## 2017-03-03 DIAGNOSIS — J988 Other specified respiratory disorders: Secondary | ICD-10-CM

## 2017-03-03 DIAGNOSIS — B9689 Other specified bacterial agents as the cause of diseases classified elsewhere: Secondary | ICD-10-CM

## 2017-03-03 MED ORDER — AZITHROMYCIN 250 MG PO TABS: ORAL_TABLET | ORAL | 0 refills | Status: DC

## 2017-03-03 NOTE — Progress Notes (Signed)
Patient ID: ERYN KREJCI MRN: [id number], DOB: 04-27-1940, 77 y.o. Date of Encounter: 03/03/2017, 1:05 PM    Chief Complaint:  Chief Complaint  Patient presents with  . Cough  . chest congestion  . sinus problems     HPI: 77 y.o. year old male presents with above.   Reports that he returned from a cruise on Monday.  Reports that the symptoms started about 2 days prior to that. He said that Saturday he developed a runny nose, sore throat, cough.  The symptoms have persisted.  Now is coughing out thick yellowish brown phlegm.  No known fever.   sore throat is minimal now.     Home Meds:   Outpatient Medications Prior to Visit  Medication Sig Dispense Refill  . aspirin EC 81 MG tablet Take 81 mg by mouth daily.    Marland Kitchen atenolol (TENORMIN) 50 MG tablet TAKE 1 TABLET BY MOUTH ONCE DAILY 90 tablet 0  . atorvastatin (LIPITOR) 10 MG tablet TAKE ONE TABLET BY MOUTH ONCE DAILY 90 tablet 3  . ELIQUIS 5 MG TABS tablet TAKE ONE TABLET BY MOUTH TWICE DAILY 180 tablet 3  . isosorbide mononitrate (IMDUR) 30 MG 24 hr tablet Take 1 tablet (30 mg total) by mouth daily. 30 tablet 1  . Multiple Vitamin (MULTIVITAMIN) capsule Take 1 capsule by mouth daily.    . nitroGLYCERIN (NITROSTAT) 0.4 MG SL tablet Place 1 tablet (0.4 mg total) under the tongue every 5 (five) minutes x 3 doses as needed for chest pain. 30 tablet 3  . Omega 3 1000 MG CAPS Take 2,000 mg by mouth 2 (two) times daily.    . sertraline (ZOLOFT) 100 MG tablet TAKE ONE TABLET BY MOUTH ONCE DAILY 90 tablet 1  . vitamin E (VITAMIN E) 400 UNIT capsule Take 800 Units by mouth daily.     No facility-administered medications prior to visit.     Allergies:  Allergies  Allergen Reactions  . Sulfa Antibiotics Rash      Review of Systems: See HPI for pertinent ROS. All other ROS negative.    Physical Exam: Blood pressure 102/68, pulse 96, temperature 98.3 F (36.8 C), temperature source Oral, resp. rate 14, height 5\' 11"  (1.803 m),  weight 106.9 kg (235 lb 9.6 oz), SpO2 98 %., Body mass index is 32.86 kg/m. General:  WM. Appears in no acute distress. HEENT: Normocephalic, atraumatic, eyes without discharge, sclera non-icteric, nares are without discharge. Bilateral auditory canals clear, TM's are without perforation, pearly grey and translucent with reflective cone of light bilaterally. Oral cavity moist, posterior pharynx without exudate, erythema, peritonsillar abscess.  No tenderness with percussion to frontal or maxillary sinuses bilaterally.  Neck: Supple. No thyromegaly. No lymphadenopathy. Lungs: Clear bilaterally to auscultation without wheezes, rales, or rhonchi. Breathing is unlabored. Heart: Irregular Msk:  Strength and tone normal for age. Extremities/Skin: Warm and dry.  Neuro: Alert and oriented X 3. Moves all extremities spontaneously. Gait is normal. CNII-XII grossly in tact. Psych:  Responds to questions appropriately with a normal affect.     ASSESSMENT AND PLAN:  77 y.o. year old male with  1. Bacterial respiratory infection To take antibiotic as directed.  Follow-up if symptoms worsen significantly or do not resolve within 1 week after completion of antibiotic. - azithromycin (ZITHROMAX) 250 MG tablet; Day 1: Take 2 daily. Days 2 - 5: Take 1 daily.  Dispense: 6 tablet; Refill: 0   Signed, [address] [place], [place], [facility] 03/03/2017 1:05 PM

## 2017-03-13 DIAGNOSIS — I2510 Atherosclerotic heart disease of native coronary artery without angina pectoris: Secondary | ICD-10-CM | POA: Diagnosis not present

## 2017-03-13 DIAGNOSIS — I200 Unstable angina: Secondary | ICD-10-CM | POA: Diagnosis not present

## 2017-03-13 DIAGNOSIS — I10 Essential (primary) hypertension: Secondary | ICD-10-CM | POA: Diagnosis not present

## 2017-03-13 DIAGNOSIS — I482 Chronic atrial fibrillation: Secondary | ICD-10-CM | POA: Diagnosis not present

## 2017-03-13 DIAGNOSIS — E785 Hyperlipidemia, unspecified: Secondary | ICD-10-CM | POA: Diagnosis not present

## 2017-03-13 DIAGNOSIS — Z7901 Long term (current) use of anticoagulants: Secondary | ICD-10-CM | POA: Diagnosis not present

## 2017-03-13 DIAGNOSIS — Z955 Presence of coronary angioplasty implant and graft: Secondary | ICD-10-CM | POA: Diagnosis not present

## 2017-03-13 DIAGNOSIS — R079 Chest pain, unspecified: Secondary | ICD-10-CM | POA: Diagnosis not present

## 2017-03-13 DIAGNOSIS — Z79899 Other long term (current) drug therapy: Secondary | ICD-10-CM | POA: Diagnosis not present

## 2017-03-15 ENCOUNTER — Telehealth: Payer: Self-pay | Admitting: Family Medicine

## 2017-03-15 ENCOUNTER — Telehealth: Payer: Self-pay | Admitting: Cardiovascular Disease

## 2017-03-15 DIAGNOSIS — I10 Essential (primary) hypertension: Secondary | ICD-10-CM | POA: Diagnosis not present

## 2017-03-15 DIAGNOSIS — Z955 Presence of coronary angioplasty implant and graft: Secondary | ICD-10-CM | POA: Diagnosis not present

## 2017-03-15 DIAGNOSIS — I482 Chronic atrial fibrillation: Secondary | ICD-10-CM | POA: Diagnosis not present

## 2017-03-15 DIAGNOSIS — Z79899 Other long term (current) drug therapy: Secondary | ICD-10-CM | POA: Diagnosis not present

## 2017-03-15 DIAGNOSIS — Z743 Need for continuous supervision: Secondary | ICD-10-CM | POA: Diagnosis not present

## 2017-03-15 DIAGNOSIS — Z7901 Long term (current) use of anticoagulants: Secondary | ICD-10-CM | POA: Diagnosis not present

## 2017-03-15 DIAGNOSIS — Z882 Allergy status to sulfonamides status: Secondary | ICD-10-CM | POA: Diagnosis not present

## 2017-03-15 DIAGNOSIS — Z9049 Acquired absence of other specified parts of digestive tract: Secondary | ICD-10-CM | POA: Diagnosis not present

## 2017-03-15 DIAGNOSIS — I2510 Atherosclerotic heart disease of native coronary artery without angina pectoris: Secondary | ICD-10-CM | POA: Diagnosis not present

## 2017-03-15 DIAGNOSIS — I25110 Atherosclerotic heart disease of native coronary artery with unstable angina pectoris: Secondary | ICD-10-CM | POA: Diagnosis not present

## 2017-03-15 DIAGNOSIS — Z96659 Presence of unspecified artificial knee joint: Secondary | ICD-10-CM | POA: Diagnosis not present

## 2017-03-15 DIAGNOSIS — T82855A Stenosis of coronary artery stent, initial encounter: Secondary | ICD-10-CM | POA: Diagnosis not present

## 2017-03-15 DIAGNOSIS — E785 Hyperlipidemia, unspecified: Secondary | ICD-10-CM | POA: Diagnosis not present

## 2017-03-15 DIAGNOSIS — Z7982 Long term (current) use of aspirin: Secondary | ICD-10-CM | POA: Diagnosis not present

## 2017-03-15 DIAGNOSIS — I498 Other specified cardiac arrhythmias: Secondary | ICD-10-CM | POA: Diagnosis not present

## 2017-03-15 DIAGNOSIS — R072 Precordial pain: Secondary | ICD-10-CM | POA: Diagnosis not present

## 2017-03-15 DIAGNOSIS — R0789 Other chest pain: Secondary | ICD-10-CM | POA: Diagnosis not present

## 2017-03-15 DIAGNOSIS — I200 Unstable angina: Secondary | ICD-10-CM | POA: Diagnosis not present

## 2017-03-15 DIAGNOSIS — I249 Acute ischemic heart disease, unspecified: Secondary | ICD-10-CM | POA: Diagnosis not present

## 2017-03-15 NOTE — Telephone Encounter (Signed)
If having cath, no need for cardiac CT. Where is this being done?

## 2017-03-15 NOTE — Telephone Encounter (Signed)
noted 

## 2017-03-15 NOTE — Telephone Encounter (Signed)
Pts wife called and cancelled patients cpe that was scheduled, he is currently in the hospital out of town and will be transported to another hospital to have a heart cath done. They will call back at a later time to reschedule this app.

## 2017-03-15 NOTE — Telephone Encounter (Signed)
[place], scheduled for tomorrow

## 2017-03-15 NOTE — Telephone Encounter (Signed)
Please give pt a call concerning his upcoming CT. He's out of town right now and will be going to have heart surgery tomorrow, would like to know what he should do. Can be reached @ [phone]

## 2017-03-15 NOTE — Telephone Encounter (Signed)
Patient having heart cath and wonders if he should cancel cardiac CT scheduled for 03/24/17

## 2017-03-17 ENCOUNTER — Encounter: Payer: Medicare Other | Admitting: Family Medicine

## 2017-03-22 ENCOUNTER — Telehealth: Payer: Self-pay | Admitting: Cardiovascular Disease

## 2017-03-22 NOTE — Telephone Encounter (Signed)
I will request records from Dr Santiago Bumpers at [facility] phone [phone], fax [phone]  Also nurse Vickie [phone]

## 2017-03-22 NOTE — Telephone Encounter (Signed)
Patient asking about if he needs to do rehab per [place] discharge

## 2017-03-23 NOTE — Telephone Encounter (Signed)
Fax request through Epic done  For records

## 2017-03-24 ENCOUNTER — Ambulatory Visit (HOSPITAL_COMMUNITY): Payer: Medicare Other

## 2017-04-14 ENCOUNTER — Other Ambulatory Visit: Payer: Self-pay | Admitting: Family Medicine

## 2017-04-21 ENCOUNTER — Ambulatory Visit: Payer: Medicare Other | Admitting: Cardiovascular Disease

## 2017-04-21 ENCOUNTER — Encounter: Payer: Self-pay | Admitting: Cardiovascular Disease

## 2017-04-21 VITALS — BP 114/70 | HR 68 | Ht 71.0 in | Wt 225.0 lb

## 2017-04-21 DIAGNOSIS — I25118 Atherosclerotic heart disease of native coronary artery with other forms of angina pectoris: Secondary | ICD-10-CM | POA: Diagnosis not present

## 2017-04-21 DIAGNOSIS — Z955 Presence of coronary angioplasty implant and graft: Secondary | ICD-10-CM

## 2017-04-21 DIAGNOSIS — Z9289 Personal history of other medical treatment: Secondary | ICD-10-CM | POA: Diagnosis not present

## 2017-04-21 DIAGNOSIS — I4819 Other persistent atrial fibrillation: Secondary | ICD-10-CM

## 2017-04-21 DIAGNOSIS — I10 Essential (primary) hypertension: Secondary | ICD-10-CM | POA: Diagnosis not present

## 2017-04-21 DIAGNOSIS — I481 Persistent atrial fibrillation: Secondary | ICD-10-CM | POA: Diagnosis not present

## 2017-04-21 MED ORDER — CLOPIDOGREL BISULFATE 75 MG PO TABS: 75.0000 mg | ORAL_TABLET | Freq: Every day | ORAL | 3 refills | Status: DC

## 2017-04-21 MED ORDER — ATORVASTATIN CALCIUM 40 MG PO TABS: 40.0000 mg | ORAL_TABLET | Freq: Every day | ORAL | 3 refills | Status: DC

## 2017-04-21 NOTE — Patient Instructions (Signed)
Medication Instructions:  STOP ASPIRIN STOP IMDUR  START PLAVIX 75 MG DAILY INCREASE LIPITOR TO 40 MG DAILY   Labwork: NONE  Testing/Procedures: NONE  Follow-Up: Your physician recommends that you schedule a follow-up appointment in: 3 MONTHS    Any Other Special Instructions Will Be Listed Below (If Applicable).     If you need a refill on your cardiac medications before your next appointment, please call your pharmacy.

## 2017-04-21 NOTE — Progress Notes (Signed)
SUBJECTIVE: The patient presents for follow-up after being hospitalized in [place], [place].  He presented to a hospital there with unstable angina.  He underwent a nuclear stress test which demonstrated anterior ischemia.  He then underwent cardiac catheterization which demonstrated severe stenosis of the ostium of the RCA.  The stent did not appear to be fully expanded.  This was successfully treated with balloon angioplasty.  He underwent FFR of the LAD which demonstrated nonobstructive disease.  Specific cardiac catheterization results are detailed below.  He has an extensive list of questions regarding his specific coronary artery disease, interventions performed recently and back in May 2014, and questions about each of his medications.  We discussed all of these in great detail.  He currently denies chest pain, shortness of breath, palpitations, leg swelling, and dizziness.  He has been having some bruising on the palms of his hands.  He was prescribed Plavix but was only given a 30-day supply.  He is currently taking aspirin and Eliquis.  Lipitor was increased to 40 mg.    Review of Systems: As per "subjective", otherwise negative.  Allergies  Allergen Reactions  . Sulfa Antibiotics Rash    Current Outpatient Medications  Medication Sig Dispense Refill  . atenolol (TENORMIN) 50 MG tablet TAKE 1 TABLET BY MOUTH ONCE DAILY 90 tablet 0  . ELIQUIS 5 MG TABS tablet TAKE ONE TABLET BY MOUTH TWICE DAILY 180 tablet 3  . Multiple Vitamin (MULTIVITAMIN) capsule Take 1 capsule by mouth daily.    . nitroGLYCERIN (NITROSTAT) 0.4 MG SL tablet Place 1 tablet (0.4 mg total) under the tongue every 5 (five) minutes x 3 doses as needed for chest pain. 30 tablet 3  . Omega 3 1000 MG CAPS Take 2,000 mg by mouth 2 (two) times daily.    . sertraline (ZOLOFT) 100 MG tablet TAKE ONE TABLET BY MOUTH ONCE DAILY 90 tablet 1  . vitamin E (VITAMIN E) 400 UNIT capsule Take 800 Units by mouth daily.     No  current facility-administered medications for this visit.     Past Medical History:  Diagnosis Date  . Acute meniscal tear of left knee   . Anginal pain ([place])    comes and goes; uses rest to relieve   . Anticoagulant long-term use   . Anxiety   . At risk for sleep apnea    STOP-BANG= 4       SENT TO PCP 05-04-2014  . Atrial fibrillation [facility])    new on-set Jan 2016--  with good rate control on atenolol  per cardiologist note Dr Bronson Ing 04-09-2014  . CAD, multiple vessel cardiologist--  dr Bronson Ing   3 vessel CAD //  s/p DES x1 to ostial RCA 06-17-2012  . Depression   . Dysrhythmia   . History of adenomatous polyp of colon    2009  . Hyperlipidemia   . Hypertension   . OA (osteoarthritis) of knee   . S/P drug eluting coronary stent placement    RCA x1  06-17-2012  . Shortness of breath dyspnea    with exercise     Past Surgical History:  Procedure Laterality Date  . CARDIAC CATHETERIZATION  06-15-2012   dr Angelena Form   Triple vessel CAD with severe ostial RCA stenosis, severe stenosis in the small caliber diagonal branch and small caliber OM1, OM2 branches/  preserved LVSF  . COLONOSCOPY  last one 07-01-2010   Dr. Madolyn Frieze. mild diveticulosis of left colon, small internal  hemorrhoids. next tcs in 5 years due to h/o colon polyps.  . COLONOSCOPY N/A 12/18/2015   Procedure: COLONOSCOPY;  Surgeon: Daneil Dolin, MD;  [facility];  Service: Endoscopy;  Laterality: N/A;  1:30 pm  . KNEE ARTHROSCOPY Right 2005  (approx)  . KNEE ARTHROSCOPY Left 05/08/2014   Procedure: LEFT ARTHROSCOPY KNEE WIGHT DEBRIDEMENT PARTIAL LATERAL MENISCECTOMY AND CHONDROPLASTY;  Surgeon: Sydnee Cabal, MD;  Location: [facility];  Service: Orthopedics;  Laterality: Left;  . LAPAROSCOPIC CHOLECYSTECTOMY  2011   AND REPAIR UMBILICAL HERNIA  . LEFT ELBOW SURGERY  1969  . PERCUTANEOUS CORONARY STENT INTERVENTION (PCI-S) N/A 06/17/2012   Procedure: PERCUTANEOUS CORONARY STENT  INTERVENTION (PCI-S);  Surgeon: Burnell Blanks, MD;  Location: [facility] CATH LAB;  Service: Cardiovascular;  Laterality: N/A;  DES x1 ostial RCA  . SHOULDER ARTHROSCOPY WITH ROTATOR CUFF REPAIR Bilateral x2 left//   x1 right (last one , left in 2001)  . TONSILLECTOMY  1970  . TOTAL HIP ARTHROPLASTY Right 05-25-2006  . TOTAL KNEE ARTHROPLASTY Left 01/03/2015   Procedure: TOTAL KNEE ARTHROPLASTY;  Surgeon: Sydnee Cabal, MD;  Location: [facility];  Service: Orthopedics;  Laterality: Left;  . TRANSTHORACIC ECHOCARDIOGRAM  02-08-2014   mild focal basal hypertrophy of the septum, ef 55-60%,  indeterminate diastolic function in setting of atrial fib./  possibly bicuspid AV, mild calcification without stenosis, trivial AR/  mild MR and PR/  moderate to severe LAE/  trivial PR/  moderate RAE  . trauma to left finger     partially amputation secondary to lawnmower accident   . VASECTOMY  1970'sj  w/  general anesthesia    Social History   Socioeconomic History  . Marital status: Married    Spouse name: Not on file  . Number of children: Not on file  . Years of education: Not on file  . Highest education level: Not on file  Occupational History  . Not on file  Social Needs  . Financial resource strain: Not on file  . Food insecurity:    Worry: Not on file    Inability: Not on file  . Transportation needs:    Medical: Not on file    Non-medical: Not on file  Tobacco Use  . Smoking status: Former Smoker    Packs/day: 0.50    Years: 8.00    Pack years: 4.00    Types: Cigarettes    Start date: 02/02/1959    Last attempt to quit: 01/27/1968    Years since quitting: 49.2  . Smokeless tobacco: Former Systems developer    Types: [place] date: 01/26/1970  Substance and Sexual Activity  . Alcohol use: No    Alcohol/week: 0.0 oz    Comment: RARE  . Drug use: No  . Sexual activity: Not on file  Lifestyle  . Physical activity:    Days per week: Not on file    Minutes per session: Not on file  . Stress:  Not on file  Relationships  . Social connections:    Talks on phone: Not on file    Gets together: Not on file    Attends religious service: Not on file    Active member of club or organization: Not on file    Attends meetings of clubs or organizations: Not on file    Relationship status: Not on file  . Intimate partner violence:    Fear of current or ex partner: Not on file  Emotionally abused: Not on file    Physically abused: Not on file    Forced sexual activity: Not on file  Other Topics Concern  . Not on file  Social History Narrative   [employer], retired     Vitals:   04/21/17 1019  BP: 114/70  Pulse: 68  SpO2: 98%  Weight: 225 lb (102.1 kg)  Height: 5\' 11"  (1.803 m)    Wt Readings from Last 3 Encounters:  04/21/17 225 lb (102.1 kg)  03/03/17 235 lb 9.6 oz (106.9 kg)  03/02/17 237 lb (107.5 kg)     PHYSICAL EXAM General: NAD HEENT: Normal. Neck: No JVD, no thyromegaly. Lungs: Clear to auscultation bilaterally with normal respiratory effort. CV: Regular rate and irregular rhythm, normal S1/S2, no S3, no murmur. No pretibial or periankle edema.  No carotid bruit.   Abdomen: Soft, nontender, no distention.  Neurologic: Alert and oriented.  Psych: Normal affect. Skin: Normal. Musculoskeletal: No gross deformities.    ECG: Most recent ECG reviewed.   Labs: Lab Results  Component Value Date/Time   K 4.4 02/05/2016 09:37 AM   BUN 13 02/05/2016 09:37 AM   CREATININE 1.05 02/05/2016 09:37 AM   ALT 31 02/05/2016 09:37 AM   TSH 1.279 01/31/2014 12:11 PM   HGB 15.3 02/05/2016 09:37 AM     Lipids: Lab Results  Component Value Date/Time   LDLCALC 51 02/05/2016 09:37 AM   CHOL 97 02/05/2016 09:37 AM   TRIG 103 02/05/2016 09:37 AM   HDL 25 (L) 02/05/2016 09:37 AM       Coronary Angiography (02/2017): Left Main: Mild calcification without obstruction. It divides into the LAD, LCX LAD: Medium caliber vessel that supplies the apex.  Diffuse moderate calcification throughout the proximal 3rd of the vessel. At the level of the 1st diagonal branch, there is a hazy plaque that appears to be nonobstructive on the order of 30% stenosis. The small caliber diagonal branch has a 99% proximal stenosis with TIMI 2 flow. The mid and distal LAD segments have minor luminal irregularities. LCX: Medium caliber vessel. The main body of the circumflex has no significant disease. A small OM 1 has a proximal focal 99% stenosis. The posterolateral branch has a proximal 99% stenosis. It is a small caliber vessel. RCA: Large caliber dominant vessel with a significant distribution. The ostial stent is patent. The portion of the stent located at the ostium does not appear to be completely expanded and there is severe stenosis. The remainder of the stent is widely patent. The mid RCA has a 30% eccentric plaque. A focal 30% distal stenosis. The posterolateral branch has an extensive distribution with no significant disease. The PDA branch is a medium-sized vessel with no significant disease.  Impression:  Severe stenosis at the ostium of the RCA. The DES does not appear to be completely expanded. The remainder of the stent is widely patent  Branch vessel obstructive disease with 99% lesions of the diagonal, OM1, posterolateral branch of the LCX. These were described on the cardiac catheterization done in 2014 in [place].  Questionable stenosis of the proximal LAD. Fairly heavily calcified area.  Plan: PCI of the ostial RCA stent FFR LAD Consider PCI of the circumflex branches The diagonal branch is too small for PCI   Coronary angiography, FFR LAD. On 03/17/17  Informed consent was obtained. Risks including stroke, death, heart attack, kidney failure, bruising, bleeding, vascular injury, need for emergent surgery were explained to the patient and the patient agreed  to proceed. The patient was brought to the cath lab and prepped and draped in  the usual sterile fashion. The left groin was anesthetized with 2% lidocaine and the left femoral artery was cannulated via the modified Seldinger technique. A 5 French sheath was placed. A JR 3.5 catheter were utilized to take angiographic images of the RCA. Then, a 5 [place] EBU 3.75 guide catheter was used for the FFR portion of the procedure. Heparin was administered for this portion of the study. At the conclusion of the procedure the arterial sheath was sutured in place and he was transferred to the holding area in stable condition There were no complications.   Findings: Aortic Pressure: 132/68  RCA: The site of intervention yesterday at the RCA ostium was widely patent. No dampening of the pressure waveform with catheter engagement. TIMI 3 flow.  LAD FFR: FFR value= 0.85, consistent with nonobstructive disease  Impression: 1. Continued wide patency of the ostial RCA POBA site within the the DES implanted 2014. 2. FFR lad consistent with nonobstructive disease   Plan: Continue medical management of branch vessel obstructive disease. He has done well with that over the past 4 years. Resume apixaban anticoagulation tonight. Continue clopidogrel 75 mg daily. No aspirin Probable discharge later today, with follow-up with his local cardiologist in [place]: 1.  Coronary artery disease with recent ostial RCA angioplasty within the drug-eluting stent implanted in 2014: Symptomatically stable.  It is important to note that episodic chest pains lasting seconds both at rest and with exertion are his typical anginal symptoms.  He was discharged on Plavix but is not taking it.  He is currently on aspirin and Eliquis.  I will stop aspirin and provide Plavix.  I will refill Lipitor 40 mg.  I will discontinue Imdur.  2. Long-standing persistent atrial fibrillation:Heart rate is well controlled on atenolol 50 mg daily. Continue Eliquis for anticoagulation.  3.  Essential IPJ:[id number] on present therapy.  No changes.  4. Hyperlipidemia:I will refill Lipitor 40 mg.  I will obtain a copy of lipids from his PCP.  5. Possible bicuspid aortic valve:No need for additional imaging at this time. No murmur on physical exam. I would consider repeat echocardiography in the future when deemed clinically indicated.    Disposition: Follow up 3 months  Time spent: 50 minutes, of which greater than 50% was spent reviewing symptoms, relevant blood tests and studies, and discussing management plan with the patient.    Kate Sable, M.D., F.A.C.C.

## 2017-05-06 ENCOUNTER — Other Ambulatory Visit: Payer: Self-pay | Admitting: Cardiovascular Disease

## 2017-05-06 MED ORDER — APIXABAN 5 MG PO TABS: 5.0000 mg | ORAL_TABLET | Freq: Two times a day (BID) | ORAL | 3 refills | Status: DC

## 2017-05-06 NOTE — Addendum Note (Signed)
Addended by: Debbora Lacrosse R on: 05/06/2017 12:35 PM   Modules accepted: Orders

## 2017-05-06 NOTE — Telephone Encounter (Signed)
Rx sent to [facility]. 

## 2017-05-06 NOTE — Telephone Encounter (Signed)
Patient called asking for Eliquis to [facility] in [place]   they are going out of town today

## 2017-06-16 DIAGNOSIS — H35033 Hypertensive retinopathy, bilateral: Secondary | ICD-10-CM | POA: Diagnosis not present

## 2017-06-16 DIAGNOSIS — H35039 Hypertensive retinopathy, unspecified eye: Secondary | ICD-10-CM | POA: Diagnosis not present

## 2017-06-16 DIAGNOSIS — I10 Essential (primary) hypertension: Secondary | ICD-10-CM | POA: Diagnosis not present

## 2017-06-16 DIAGNOSIS — H5211 Myopia, right eye: Secondary | ICD-10-CM | POA: Diagnosis not present

## 2017-07-05 ENCOUNTER — Other Ambulatory Visit: Payer: Self-pay | Admitting: Family Medicine

## 2017-07-19 ENCOUNTER — Ambulatory Visit: Payer: Medicare Other | Admitting: Cardiovascular Disease

## 2017-07-19 ENCOUNTER — Encounter: Payer: Self-pay | Admitting: Cardiovascular Disease

## 2017-07-19 VITALS — BP 119/72 | HR 54 | Ht 71.0 in | Wt 218.0 lb

## 2017-07-19 DIAGNOSIS — I10 Essential (primary) hypertension: Secondary | ICD-10-CM | POA: Diagnosis not present

## 2017-07-19 DIAGNOSIS — Z955 Presence of coronary angioplasty implant and graft: Secondary | ICD-10-CM | POA: Diagnosis not present

## 2017-07-19 DIAGNOSIS — I481 Persistent atrial fibrillation: Secondary | ICD-10-CM

## 2017-07-19 DIAGNOSIS — E785 Hyperlipidemia, unspecified: Secondary | ICD-10-CM

## 2017-07-19 DIAGNOSIS — I4819 Other persistent atrial fibrillation: Secondary | ICD-10-CM

## 2017-07-19 DIAGNOSIS — I25118 Atherosclerotic heart disease of native coronary artery with other forms of angina pectoris: Secondary | ICD-10-CM | POA: Diagnosis not present

## 2017-07-19 MED ORDER — CLOPIDOGREL BISULFATE 75 MG PO TABS: 75.0000 mg | ORAL_TABLET | Freq: Every day | ORAL | 3 refills | Status: DC

## 2017-07-19 NOTE — Patient Instructions (Addendum)
Your physician wants you to follow-up in: 6 months  with Dr.Koneswaran You will receive a reminder letter in the mail two months in advance. If you don't receive a letter, please call our office to schedule the follow-up appointment.    STOP Aspirin   STAY ON Eliquis and Plavix    All other medications stay the same.   If you need a refill on your cardiac medications before your next appointment, please call your pharmacy.   No lab work or tests ordered today.        Thank you for choosing [facility] !

## 2017-07-19 NOTE — Progress Notes (Signed)
SUBJECTIVE: The patient presents for follow-up of coronary artery disease and atrial fibrillation. He was hospitalized earlier this year in [place], [place].  He presented to a hospital there with unstable angina.  He underwent a nuclear stress test which demonstrated anterior ischemia.  He then underwent cardiac catheterization which demonstrated severe stenosis of the ostium of the RCA.  The stent did not appear to be fully expanded.  This was successfully treated with balloon angioplasty.  He underwent FFR of the LAD which demonstrated nonobstructive disease.  He has been doing very well and he and his wife recently got back from spending 3 weeks at the beach.  He denies chest pain, palpitations, leg swelling, dizziness, and shortness of breath, he does have easy bruisability.  At his last visit I told him to stop taking baby aspirin and continue Plavix and Eliquis but he has been staying on aspirin.    He has been helping his wife with gardening and energy levels have overall improved since angioplasty.       Review of Systems: As per "subjective", otherwise negative.  Allergies  Allergen Reactions  . Sulfa Antibiotics Rash    Current Outpatient Medications  Medication Sig Dispense Refill  . apixaban (ELIQUIS) 5 MG TABS tablet Take 1 tablet (5 mg total) by mouth 2 (two) times daily. 180 tablet 3  . atenolol (TENORMIN) 50 MG tablet TAKE 1 TABLET BY MOUTH ONCE DAILY 90 tablet 0  . atorvastatin (LIPITOR) 40 MG tablet Take 1 tablet (40 mg total) by mouth daily. 90 tablet 3  . clopidogrel (PLAVIX) 75 MG tablet Take 1 tablet (75 mg total) by mouth daily. 90 tablet 3  . nitroGLYCERIN (NITROSTAT) 0.4 MG SL tablet Place 1 tablet (0.4 mg total) under the tongue every 5 (five) minutes x 3 doses as needed for chest pain. 30 tablet 3  . sertraline (ZOLOFT) 100 MG tablet TAKE ONE TABLET BY MOUTH ONCE DAILY 90 tablet 1   No current facility-administered medications for this visit.     Past  Medical History:  Diagnosis Date  . Acute meniscal tear of left knee   . Anginal pain ([place])    comes and goes; uses rest to relieve   . Anticoagulant long-term use   . Anxiety   . At risk for sleep apnea    STOP-BANG= 4       SENT TO PCP 05-04-2014  . Atrial fibrillation [facility])    new on-set Jan 2016--  with good rate control on atenolol  per cardiologist note Dr Bronson Ing 04-09-2014  . CAD, multiple vessel cardiologist--  dr Bronson Ing   3 vessel CAD //  s/p DES x1 to ostial RCA 06-17-2012  . Depression   . Dysrhythmia   . History of adenomatous polyp of colon    2009  . Hyperlipidemia   . Hypertension   . OA (osteoarthritis) of knee   . S/P drug eluting coronary stent placement    RCA x1  06-17-2012  . Shortness of breath dyspnea    with exercise     Past Surgical History:  Procedure Laterality Date  . CARDIAC CATHETERIZATION  06-15-2012   dr Angelena Form   Triple vessel CAD with severe ostial RCA stenosis, severe stenosis in the small caliber diagonal branch and small caliber OM1, OM2 branches/  preserved LVSF  . COLONOSCOPY  last one 07-01-2010   Dr. Madolyn Frieze. mild diveticulosis of left colon, small internal hemorrhoids. next tcs in 5 years due to  h/o colon polyps.  . COLONOSCOPY N/A 12/18/2015   Procedure: COLONOSCOPY;  Surgeon: Daneil Dolin, MD;  [facility];  Service: Endoscopy;  Laterality: N/A;  1:30 pm  . KNEE ARTHROSCOPY Right 2005  (approx)  . KNEE ARTHROSCOPY Left 05/08/2014   Procedure: LEFT ARTHROSCOPY KNEE WIGHT DEBRIDEMENT PARTIAL LATERAL MENISCECTOMY AND CHONDROPLASTY;  Surgeon: Sydnee Cabal, MD;  Location: [place];  Service: Orthopedics;  Laterality: Left;  . LAPAROSCOPIC CHOLECYSTECTOMY  2011   AND REPAIR UMBILICAL HERNIA  . LEFT ELBOW SURGERY  1969  . PERCUTANEOUS CORONARY STENT INTERVENTION (PCI-S) N/A 06/17/2012   Procedure: PERCUTANEOUS CORONARY STENT INTERVENTION (PCI-S);  Surgeon: Burnell Blanks, MD;   Location: [facility] CATH LAB;  Service: Cardiovascular;  Laterality: N/A;  DES x1 ostial RCA  . SHOULDER ARTHROSCOPY WITH ROTATOR CUFF REPAIR Bilateral x2 left//   x1 right (last one , left in 2001)  . TONSILLECTOMY  1970  . TOTAL HIP ARTHROPLASTY Right 05-25-2006  . TOTAL KNEE ARTHROPLASTY Left 01/03/2015   Procedure: TOTAL KNEE ARTHROPLASTY;  Surgeon: Sydnee Cabal, MD;  Location: [facility];  Service: Orthopedics;  Laterality: Left;  . TRANSTHORACIC ECHOCARDIOGRAM  02-08-2014   mild focal basal hypertrophy of the septum, ef 55-60%,  indeterminate diastolic function in setting of atrial fib./  possibly bicuspid AV, mild calcification without stenosis, trivial AR/  mild MR and PR/  moderate to severe LAE/  trivial PR/  moderate RAE  . trauma to left finger     partially amputation secondary to lawnmower accident   . VASECTOMY  1970'sj  w/  general anesthesia    Social History   Socioeconomic History  . Marital status: Married    Spouse name: Not on file  . Number of children: Not on file  . Years of education: Not on file  . Highest education level: Not on file  Occupational History  . Not on file  Social Needs  . Financial resource strain: Not on file  . Food insecurity:    Worry: Not on file    Inability: Not on file  . Transportation needs:    Medical: Not on file    Non-medical: Not on file  Tobacco Use  . Smoking status: Former Smoker    Packs/day: 0.50    Years: 8.00    Pack years: 4.00    Types: Cigarettes    Start date: 02/02/1959    Last attempt to quit: 01/27/1968    Years since quitting: 49.5  . Smokeless tobacco: Former Systems developer    Types: [place] date: 01/26/1970  Substance and Sexual Activity  . Alcohol use: No    Alcohol/week: 0.0 oz    Comment: RARE  . Drug use: No  . Sexual activity: Not on file  Lifestyle  . Physical activity:    Days per week: Not on file    Minutes per session: Not on file  . Stress: Not on file  Relationships  . Social connections:     Talks on phone: Not on file    Gets together: Not on file    Attends religious service: Not on file    Active member of club or organization: Not on file    Attends meetings of clubs or organizations: Not on file    Relationship status: Not on file  . Intimate partner violence:    Fear of current or ex partner: Not on file    Emotionally abused: Not on file  Physically abused: Not on file    Forced sexual activity: Not on file  Other Topics Concern  . Not on file  Social History Narrative   [employer], retired     Vitals:   07/19/17 1559  BP: 119/72  Pulse: (!) 54  SpO2: 98%  Weight: 218 lb (98.9 kg)  Height: 5\' 11"  (1.803 m)    Wt Readings from Last 3 Encounters:  07/19/17 218 lb (98.9 kg)  04/21/17 225 lb (102.1 kg)  03/03/17 235 lb 9.6 oz (106.9 kg)     PHYSICAL EXAM General: NAD HEENT: Normal. Neck: No JVD, no thyromegaly. Lungs: Clear to auscultation bilaterally with normal respiratory effort. CV: Regular rate and rhythm, normal S1/S2, no S3/S4, no murmur. No pretibial or periankle edema.  No carotid bruit.   Abdomen: Soft, nontender, no distention.  Neurologic: Alert and oriented.  Psych: Normal affect. Skin: Normal. Musculoskeletal: No gross deformities.    ECG: Most recent ECG reviewed.   Labs: Lab Results  Component Value Date/Time   K 4.4 02/05/2016 09:37 AM   BUN 13 02/05/2016 09:37 AM   CREATININE 1.05 02/05/2016 09:37 AM   ALT 31 02/05/2016 09:37 AM   TSH 1.279 01/31/2014 12:11 PM   HGB 15.3 02/05/2016 09:37 AM     Lipids: Lab Results  Component Value Date/Time   LDLCALC 51 02/05/2016 09:37 AM   CHOL 97 02/05/2016 09:37 AM   TRIG 103 02/05/2016 09:37 AM   HDL 25 (L) 02/05/2016 09:37 AM      Coronary Angiography (02/2017): Left Main: Mild calcification without obstruction. It divides into the LAD, LCX LAD: Medium caliber vessel that supplies the apex. Diffuse moderate calcification throughout the proximal 3rd of the  vessel. At the level of the 1st diagonal branch, there is a hazy plaque that appears to be nonobstructive on the order of 30% stenosis. The small caliber diagonal branch has a 99% proximal stenosis with TIMI 2 flow. The mid and distal LAD segments have minor luminal irregularities. LCX: Medium caliber vessel. The main body of the circumflex has no significant disease. A small OM 1 has a proximal focal 99% stenosis. The posterolateral branch has a proximal 99% stenosis. It is a small caliber vessel. RCA: Large caliber dominant vessel with a significant distribution. The ostial stent is patent. The portion of the stent located at the ostium does not appear to be completely expanded and there is severe stenosis. The remainder of the stent is widely patent. The mid RCA has a 30% eccentric plaque. A focal 30% distal stenosis. The posterolateral branch has an extensive distribution with no significant disease. The PDA branch is a medium-sized vessel with no significant disease.  Impression:  Severe stenosis at the ostium of the RCA. The DES does not appear to be completely expanded. The remainder of the stent is widely patent  Branch vessel obstructive disease with 99% lesions of the diagonal, OM1, posterolateral branch of the LCX. These were described on the cardiac catheterization done in 2014 in [place].  Questionable stenosis of the proximal LAD. Fairly heavily calcified area.  Plan: PCI of the ostial RCA stent FFR LAD Consider PCI of the circumflex branches The diagonal branch is too small for PCI   Coronary angiography, FFR LAD. On 03/17/17  Informed consent was obtained. Risks including stroke, death, heart attack, kidney failure, bruising, bleeding, vascular injury, need for emergent surgery were explained to the patient and the patient agreed to proceed. The patient was brought to the cath  lab and prepped and draped in the usual sterile fashion. The left groin was anesthetized with 2%  lidocaine and the left femoral artery was cannulated via the modified Seldinger technique. A 5 French sheath was placed. A JR 3.5 catheter were utilized to take angiographic images of the RCA. Then, a 5 [place] EBU 3.75 guide catheter was used for the FFR portion of the procedure. Heparin was administered for this portion of the study. At the conclusion of the procedure the arterial sheath was sutured in place and he was transferred to the holding area in stable condition There were no complications.   Findings: Aortic Pressure: 132/68  RCA: The site of intervention yesterday at the RCA ostium was widely patent. No dampening of the pressure waveform with catheter engagement. TIMI 3 flow.  LAD FFR: FFR value= 0.85, consistent with nonobstructive disease  Impression: 1. Continued wide patency of the ostial RCA POBA site within the the DES implanted 2014. 2. FFR lad consistent with nonobstructive disease   Plan: Continue medical management of branch vessel obstructive disease. He has done well with that over the past 4 years. Resume apixaban anticoagulation tonight. Continue clopidogrel 75 mg daily. No aspirin Probable discharge later today, with follow-up with his local cardiologist in [place]:  1.  Coronary artery disease with ostial RCA angioplasty in February 2019 within the drug-eluting stent implanted in 2014: Symptomatically stable.  It is important to note that episodic chest pains lasting seconds both at rest and with exertion are his typical anginal symptoms.   Currently on Plavix and Lipitor.  He has stayed on aspirin but I told him to discontinue this while he is taking Plavix and Eliquis.  2. Long-standing persistentatrial fibrillation:Heart rate is well controlled on atenolol 50 mg daily. Continue Eliquis for anticoagulation.  3. Essential BEM:[id number] on present therapy.  No changes.  4. Hyperlipidemia: Continue Lipitor 40 mg.  He is  due to have lipids checked in mid July.  He would like to reduce the dose of Lipitor.  I recommended he continue on this dose until he sees his PCP and has lipids checked in July.  5. Possible bicuspid aortic valve:No need for additional imaging at this time. No murmur on physical exam. I would consider repeat echocardiography in the future when deemed clinically indicated.      Disposition: Follow up 6 months    Kate Sable, M.D., F.A.C.C.

## 2017-07-19 NOTE — Addendum Note (Signed)
Addended by: Barbarann Ehlers A on: 07/19/2017 04:35 PM   Modules accepted: Orders

## 2017-07-22 ENCOUNTER — Ambulatory Visit: Payer: Self-pay | Admitting: Cardiovascular Disease

## 2017-08-13 ENCOUNTER — Other Ambulatory Visit: Payer: Self-pay

## 2017-08-13 ENCOUNTER — Encounter: Payer: Self-pay | Admitting: Family Medicine

## 2017-08-13 ENCOUNTER — Ambulatory Visit (INDEPENDENT_AMBULATORY_CARE_PROVIDER_SITE_OTHER): Payer: Medicare Other | Admitting: Family Medicine

## 2017-08-13 VITALS — BP 120/66 | HR 82 | Temp 97.9°F | Resp 14 | Ht 71.0 in | Wt 214.0 lb

## 2017-08-13 DIAGNOSIS — E7800 Pure hypercholesterolemia, unspecified: Secondary | ICD-10-CM | POA: Diagnosis not present

## 2017-08-13 DIAGNOSIS — I10 Essential (primary) hypertension: Secondary | ICD-10-CM

## 2017-08-13 DIAGNOSIS — Z0000 Encounter for general adult medical examination without abnormal findings: Secondary | ICD-10-CM

## 2017-08-13 DIAGNOSIS — Z23 Encounter for immunization: Secondary | ICD-10-CM | POA: Diagnosis not present

## 2017-08-13 DIAGNOSIS — Z125 Encounter for screening for malignant neoplasm of prostate: Secondary | ICD-10-CM

## 2017-08-13 DIAGNOSIS — I25118 Atherosclerotic heart disease of native coronary artery with other forms of angina pectoris: Secondary | ICD-10-CM | POA: Diagnosis not present

## 2017-08-13 LAB — COMPREHENSIVE METABOLIC PANEL
AG Ratio: 1.6 (calc) (ref 1.0–2.5)
ALKALINE PHOSPHATASE (APISO): 53 U/L (ref 40–115)
ALT: 44 U/L (ref 9–46)
AST: 30 U/L (ref 10–35)
Albumin: 4.4 g/dL (ref 3.6–5.1)
BILIRUBIN TOTAL: 1.1 mg/dL (ref 0.2–1.2)
BUN: 14 mg/dL (ref 7–25)
CALCIUM: 9.4 mg/dL (ref 8.6–10.3)
CO2: 27 mmol/L (ref 20–32)
CREATININE: 1.15 mg/dL (ref 0.70–1.18)
Chloride: 107 mmol/L (ref 98–110)
GLOBULIN: 2.8 g/dL (ref 1.9–3.7)
GLUCOSE: 103 mg/dL — AB (ref 65–99)
Potassium: 4.5 mmol/L (ref 3.5–5.3)
Sodium: 140 mmol/L (ref 135–146)
Total Protein: 7.2 g/dL (ref 6.1–8.1)

## 2017-08-13 LAB — CBC WITH DIFFERENTIAL/PLATELET
BASOS ABS: 41 {cells}/uL (ref 0–200)
BASOS PCT: 0.7 %
EOS ABS: 180 {cells}/uL (ref 15–500)
Eosinophils Relative: 3.1 %
HEMATOCRIT: 45.2 % (ref 38.5–50.0)
Hemoglobin: 15.7 g/dL (ref 13.2–17.1)
LYMPHS ABS: 1560 {cells}/uL (ref 850–3900)
MCH: 33.1 pg — AB (ref 27.0–33.0)
MCHC: 34.7 g/dL (ref 32.0–36.0)
MCV: 95.4 fL (ref 80.0–100.0)
MPV: 9.6 fL (ref 7.5–12.5)
Monocytes Relative: 10.9 %
NEUTROS ABS: 3387 {cells}/uL (ref 1500–7800)
Neutrophils Relative %: 58.4 %
Platelets: 221 10*3/uL (ref 140–400)
RBC: 4.74 10*6/uL (ref 4.20–5.80)
RDW: 12.5 % (ref 11.0–15.0)
Total Lymphocyte: 26.9 %
WBC: 5.8 10*3/uL (ref 3.8–10.8)
WBCMIX: 632 {cells}/uL (ref 200–950)

## 2017-08-13 LAB — LIPID PANEL
Cholesterol: 74 mg/dL
HDL: 26 mg/dL — ABNORMAL LOW
LDL Cholesterol (Calc): 31 mg/dL
Non-HDL Cholesterol (Calc): 48 mg/dL
Total CHOL/HDL Ratio: 2.8 (calc)
Triglycerides: 87 mg/dL

## 2017-08-13 LAB — PSA: PSA: 0.8 ng/mL

## 2017-08-13 NOTE — Patient Instructions (Addendum)
F/U 6 months We will call with lab results shingrix vaccine given

## 2017-08-13 NOTE — Progress Notes (Signed)
Subjective:   Patient presents for Medicare Annual/Subsequent preventive examination.   Review Past Medical/Family/Social: Per EMR    2 falls, 1 slipped on weight concrete going into a utility building, second was dizzy before his heart episode  [place] reviewed cardiology note from June detailing this, CAD his stent was clogged  Now on dual blood thinners His lipitor was increased to 40mg , due for recheck , he has noticed bowels seem more runny  No straining with bowel movements     Risk Factors  Current exercise habits:  Dietary issues discussed:   Cardiac risk factors: known CAD  Depression Screen  (Note: if answer to either of the following is "Yes", a more complete depression screening is indicated)  Over the past two weeks, have you felt down, depressed or hopeless? No Over the past two weeks, have you felt little interest or pleasure in doing things? No Have you lost interest or pleasure in daily life? No Do you often feel hopeless? No Do you cry easily over simple problems? No   Activities of Daily Living  In your present state of health, do you have any difficulty performing the following activities?:  Driving? No  Managing money? No  Feeding yourself? No  Getting from bed to chair? No  Climbing a flight of stairs? No  Preparing food and eating?: No  Bathing or showering? No  Getting dressed: No  Getting to the toilet? No  Using the toilet:No  Moving around from place to place: No  In the past year have you fallen or had a near fall?:Yes  Are you sexually active? No  Do you have more than one partner? No   Hearing Difficulties: No  Do you often ask people to speak up or repeat themselves? No  Do you experience ringing or noises in your ears? No Do you have difficulty understanding soft or whispered voices? No  Do you feel that you have a problem with memory? No Do you often misplace items? No  Do you feel safe at home?  Yes  Cognitive Testing  Alert? Yes Normal Appearance?Yes  Oriented to person? Yes Place? Yes  Time? Yes  Recall of three objects? Yes  Can perform simple calculations? Yes  Displays appropriate judgment?Yes  Can read the correct time from a watch face?Yes   List the Names of Other Physician/Practitioners you currently use:  Cardiology , orthopedics  Screening Tests / Date UTD  Colonoscopy  UTD                   Zostavax  UTD , also request shingrix  Pneumonia- UTD Influenza Vaccine  Tetanus/tdap UTD   ROS:  GEN- denies fatigue, fever, weight loss,weakness, recent illness HEENT- denies eye drainage, change in vision, nasal discharge, CVS- denies chest pain, palpitations RESP- denies SOB, cough, wheeze ABD- denies N/V, change in stools, abd pain GU- denies dysuria, hematuria, dribbling, incontinence MSK- denies joint pain, muscle aches, injury Neuro- denies headache, dizziness, syncope, seizure activity  Physical: GEN- NAD, alert and oriented x3 HEENT- PERRL, EOMI, non injected sclera, pink conjunctiva, MMM, oropharynx clear Neck- Supple, no thryomegaly, no bruit  CVS- RRR, no murmur RESP-CTAB EXT- No edema Pulses- Radial, DP- 2+    Assessment:    Annual wellness medicare exam   Plan:    During the course of the visit the patient was educated and counseled about appropriate screening and preventive services including:  He has living will Fasting labs  today for CAD- currently no symptoms, currently on plavix and eliquis  HTN- well controlled PSA screening done  Shingles vaccine given, return in 3-6 months for second dose  Cage and depression screen negative     Diet review for nutrition referral? Yes ____ Not Indicated __x__  Patient Instructions (the written plan) was given to the patient.  Medicare Attestation  I have personally reviewed:  The patient's medical and social history  Their use of alcohol, tobacco or illicit drugs  Their current medications  and supplements  The patient's functional ability including ADLs,fall risks, home safety risks, cognitive, and hearing and visual impairment  Diet and physical activities  Evidence for depression or mood disorders  The patient's weight, height, BMI, and visual acuity have been recorded in the chart. I have made referrals, counseling, and provided education to the patient based on review of the above and I have provided the patient with a written personalized care plan for preventive services.

## 2017-08-17 ENCOUNTER — Telehealth: Payer: Self-pay | Admitting: *Deleted

## 2017-08-17 NOTE — Telephone Encounter (Signed)
-----   Message from Arnoldo Lenis, MD sent at 08/17/2017 11:40 AM EDT ----- Covering for Dr Raliegh Ip, labs reviewed. Cholesterol looks good. I would advise against lowering the dose of his lipitor. Lipitor not only works to lower cholesterol but also works in several other ways to help prevent future blockages in the heart as well as brain. Its actually recommended for any patient with coronary artery disease to be on high doses for this reason, would continue current dosing   Zandra Abts MD ----- Message ----- From: Alycia Rossetti, MD Sent: 08/16/2017   4:53 PM To: Herminio Commons, MD  Labs look great! Will defer to cardiology about cutting lipitor down For now no changes, unless he hears from them

## 2017-08-17 NOTE — Telephone Encounter (Signed)
Pt made aware and will continue current dose of Lipitor

## 2017-10-07 ENCOUNTER — Other Ambulatory Visit: Payer: Self-pay | Admitting: *Deleted

## 2017-10-07 MED ORDER — ATENOLOL 50 MG PO TABS: 50.0000 mg | ORAL_TABLET | Freq: Every day | ORAL | 0 refills | Status: DC

## 2017-10-07 MED ORDER — SERTRALINE HCL 100 MG PO TABS: 100.0000 mg | ORAL_TABLET | Freq: Every day | ORAL | 1 refills | Status: DC

## 2017-12-31 ENCOUNTER — Other Ambulatory Visit: Payer: Self-pay | Admitting: Family Medicine

## 2018-01-07 ENCOUNTER — Encounter: Payer: Self-pay | Admitting: Family Medicine

## 2018-01-07 ENCOUNTER — Ambulatory Visit (INDEPENDENT_AMBULATORY_CARE_PROVIDER_SITE_OTHER): Payer: Medicare Other | Admitting: Family Medicine

## 2018-01-07 ENCOUNTER — Other Ambulatory Visit: Payer: Self-pay

## 2018-01-07 VITALS — BP 128/74 | HR 66 | Temp 98.7°F | Resp 16 | Ht 71.0 in | Wt 217.0 lb

## 2018-01-07 DIAGNOSIS — I25118 Atherosclerotic heart disease of native coronary artery with other forms of angina pectoris: Secondary | ICD-10-CM

## 2018-01-07 DIAGNOSIS — Z23 Encounter for immunization: Secondary | ICD-10-CM

## 2018-01-07 DIAGNOSIS — E7800 Pure hypercholesterolemia, unspecified: Secondary | ICD-10-CM

## 2018-01-07 DIAGNOSIS — F418 Other specified anxiety disorders: Secondary | ICD-10-CM

## 2018-01-07 DIAGNOSIS — I480 Paroxysmal atrial fibrillation: Secondary | ICD-10-CM | POA: Diagnosis not present

## 2018-01-07 DIAGNOSIS — I10 Essential (primary) hypertension: Secondary | ICD-10-CM | POA: Diagnosis not present

## 2018-01-07 LAB — LIPID PANEL
CHOL/HDL RATIO: 3.4 (calc) (ref <5.0)
CHOLESTEROL: 96 mg/dL (ref <200)
HDL: 28 mg/dL — AB (ref >40)
LDL Cholesterol (Calc): 51 mg/dL (calc)
Non-HDL Cholesterol (Calc): 68 mg/dL (calc) (ref <130)
TRIGLYCERIDES: 85 mg/dL (ref <150)

## 2018-01-07 LAB — COMPREHENSIVE METABOLIC PANEL
AG Ratio: 1.4 (calc) (ref 1.0–2.5)
ALKALINE PHOSPHATASE (APISO): 58 U/L (ref 40–115)
ALT: 29 U/L (ref 9–46)
AST: 23 U/L (ref 10–35)
Albumin: 4.4 g/dL (ref 3.6–5.1)
BUN: 13 mg/dL (ref 7–25)
CHLORIDE: 104 mmol/L (ref 98–110)
CO2: 28 mmol/L (ref 20–32)
CREATININE: 1.1 mg/dL (ref 0.70–1.18)
Calcium: 9.6 mg/dL (ref 8.6–10.3)
GLOBULIN: 3.1 g/dL (ref 1.9–3.7)
GLUCOSE: 96 mg/dL (ref 65–99)
Potassium: 4.9 mmol/L (ref 3.5–5.3)
Sodium: 139 mmol/L (ref 135–146)
Total Bilirubin: 0.8 mg/dL (ref 0.2–1.2)
Total Protein: 7.5 g/dL (ref 6.1–8.1)

## 2018-01-07 LAB — CBC WITH DIFFERENTIAL/PLATELET
Basophils Absolute: 50 cells/uL (ref 0–200)
Basophils Relative: 0.8 %
EOS ABS: 214 {cells}/uL (ref 15–500)
Eosinophils Relative: 3.4 %
HCT: 47.2 % (ref 38.5–50.0)
Hemoglobin: 16.2 g/dL (ref 13.2–17.1)
Lymphs Abs: 2003 cells/uL (ref 850–3900)
MCH: 33.5 pg — AB (ref 27.0–33.0)
MCHC: 34.3 g/dL (ref 32.0–36.0)
MCV: 97.5 fL (ref 80.0–100.0)
MPV: 9.5 fL (ref 7.5–12.5)
Monocytes Relative: 9.6 %
NEUTROS PCT: 54.4 %
Neutro Abs: 3427 cells/uL (ref 1500–7800)
PLATELETS: 244 10*3/uL (ref 140–400)
RBC: 4.84 10*6/uL (ref 4.20–5.80)
RDW: 12.5 % (ref 11.0–15.0)
TOTAL LYMPHOCYTE: 31.8 %
WBC: 6.3 10*3/uL (ref 3.8–10.8)
WBCMIX: 605 {cells}/uL (ref 200–950)

## 2018-01-07 NOTE — Patient Instructions (Signed)
F/U end of JUly for Physical  We will call with lab results

## 2018-01-07 NOTE — Progress Notes (Signed)
   Subjective:    Patient ID: Eddie Price, male    DOB: 17-Nov-1940, 77 y.o.   MRN: [id number]  Patient presents for Follow-up (is fasting)  Patient here to follow-up chronic medical problems.  Medications reviewed Still followed by cardiology  Corn[place]  artery disease/ A Fib - he  is currently asymptomatic.  His blood pressure is been controlled reviewed his medications.  He is taking a statin drug Lipitor without any difficulties in his beta-blocker atenolol He has not had any abnormal bleeding episodes with his Eliquis and Plavix.  Mood disorder he continues on the Zoloft is doing well with this  Review Of Systems:  GEN- denies fatigue, fever, weight loss,weakness, recent illness HEENT- denies eye drainage, change in vision, nasal discharge, CVS- denies chest pain, palpitations RESP- denies SOB, cough, wheeze ABD- denies N/V, change in stools, abd pain GU- denies dysuria, hematuria, dribbling, incontinence MSK- denies joint pain, muscle aches, injury Neuro- denies headache, dizziness, syncope, seizure activity       Objective:    BP 128/74   Pulse 66   Temp 98.7 F (37.1 C) (Oral)   Resp 16   Ht 5\' 11"  (1.803 m)   Wt 217 lb (98.4 kg)   SpO2 97%   BMI 30.27 kg/m  GEN- NAD, alert and oriented x3 HEENT- PERRL, EOMI, non injected sclera, pink conjunctiva, MMM, oropharynx clear Neck- Supple, no thyromegaly CVS- RRR, no murmur RESP-CTAB Psych- normal affect and mood EXT- No edema Pulses- Radial, DP- 2+        Assessment & Plan:      Problem List Items Addressed This Visit      Unprioritized   Atrial fibrillation (HCC) (Chronic)   Coronary atherosclerosis of native coronary artery - Primary (Chronic)    Followed by cardiology, in [place] Tolerating blood thinners  No current symptoms  Due for lipid and metabolic panel      Relevant Orders   CBC with Differential/Platelet (Completed)   Comprehensive metabolic panel (Completed)   Lipid panel (Completed)   Depression with anxiety    Continue zoloft, does well with medication, mood controlled      Essential hypertension, benign (Chronic)    Well controlled no changes      Relevant Orders   CBC with Differential/Platelet (Completed)   Comprehensive metabolic panel (Completed)   Hyperlipidemia (Chronic)    Other Visit Diagnoses    Need for shingles vaccine       Relevant Orders   Varicella-zoster vaccine IM (Completed)      Note: This dictation was prepared with Dragon dictation along with smaller phrase technology. Any transcriptional errors that result from this process are unintentional.

## 2018-01-09 ENCOUNTER — Encounter: Payer: Self-pay | Admitting: Family Medicine

## 2018-01-09 NOTE — Assessment & Plan Note (Signed)
Well controlled no changes 

## 2018-01-09 NOTE — Assessment & Plan Note (Signed)
Continue zoloft, does well with medication, mood controlled

## 2018-01-09 NOTE — Assessment & Plan Note (Signed)
Followed by cardiology, in [place] Tolerating blood thinners  No current symptoms  Due for lipid and metabolic panel

## 2018-01-10 ENCOUNTER — Encounter: Payer: Self-pay | Admitting: *Deleted

## 2018-01-25 DIAGNOSIS — S61402A Unspecified open wound of left hand, initial encounter: Secondary | ICD-10-CM | POA: Diagnosis not present

## 2018-02-08 IMAGING — DX DG CHEST 2V
2 series · 2 of 2 positions shown · non-contrast
Comparison: None

CLINICAL DATA: Cough, fever and vomiting since 3355 hours

EXAM:
CHEST  2 VIEW

[chest pa]
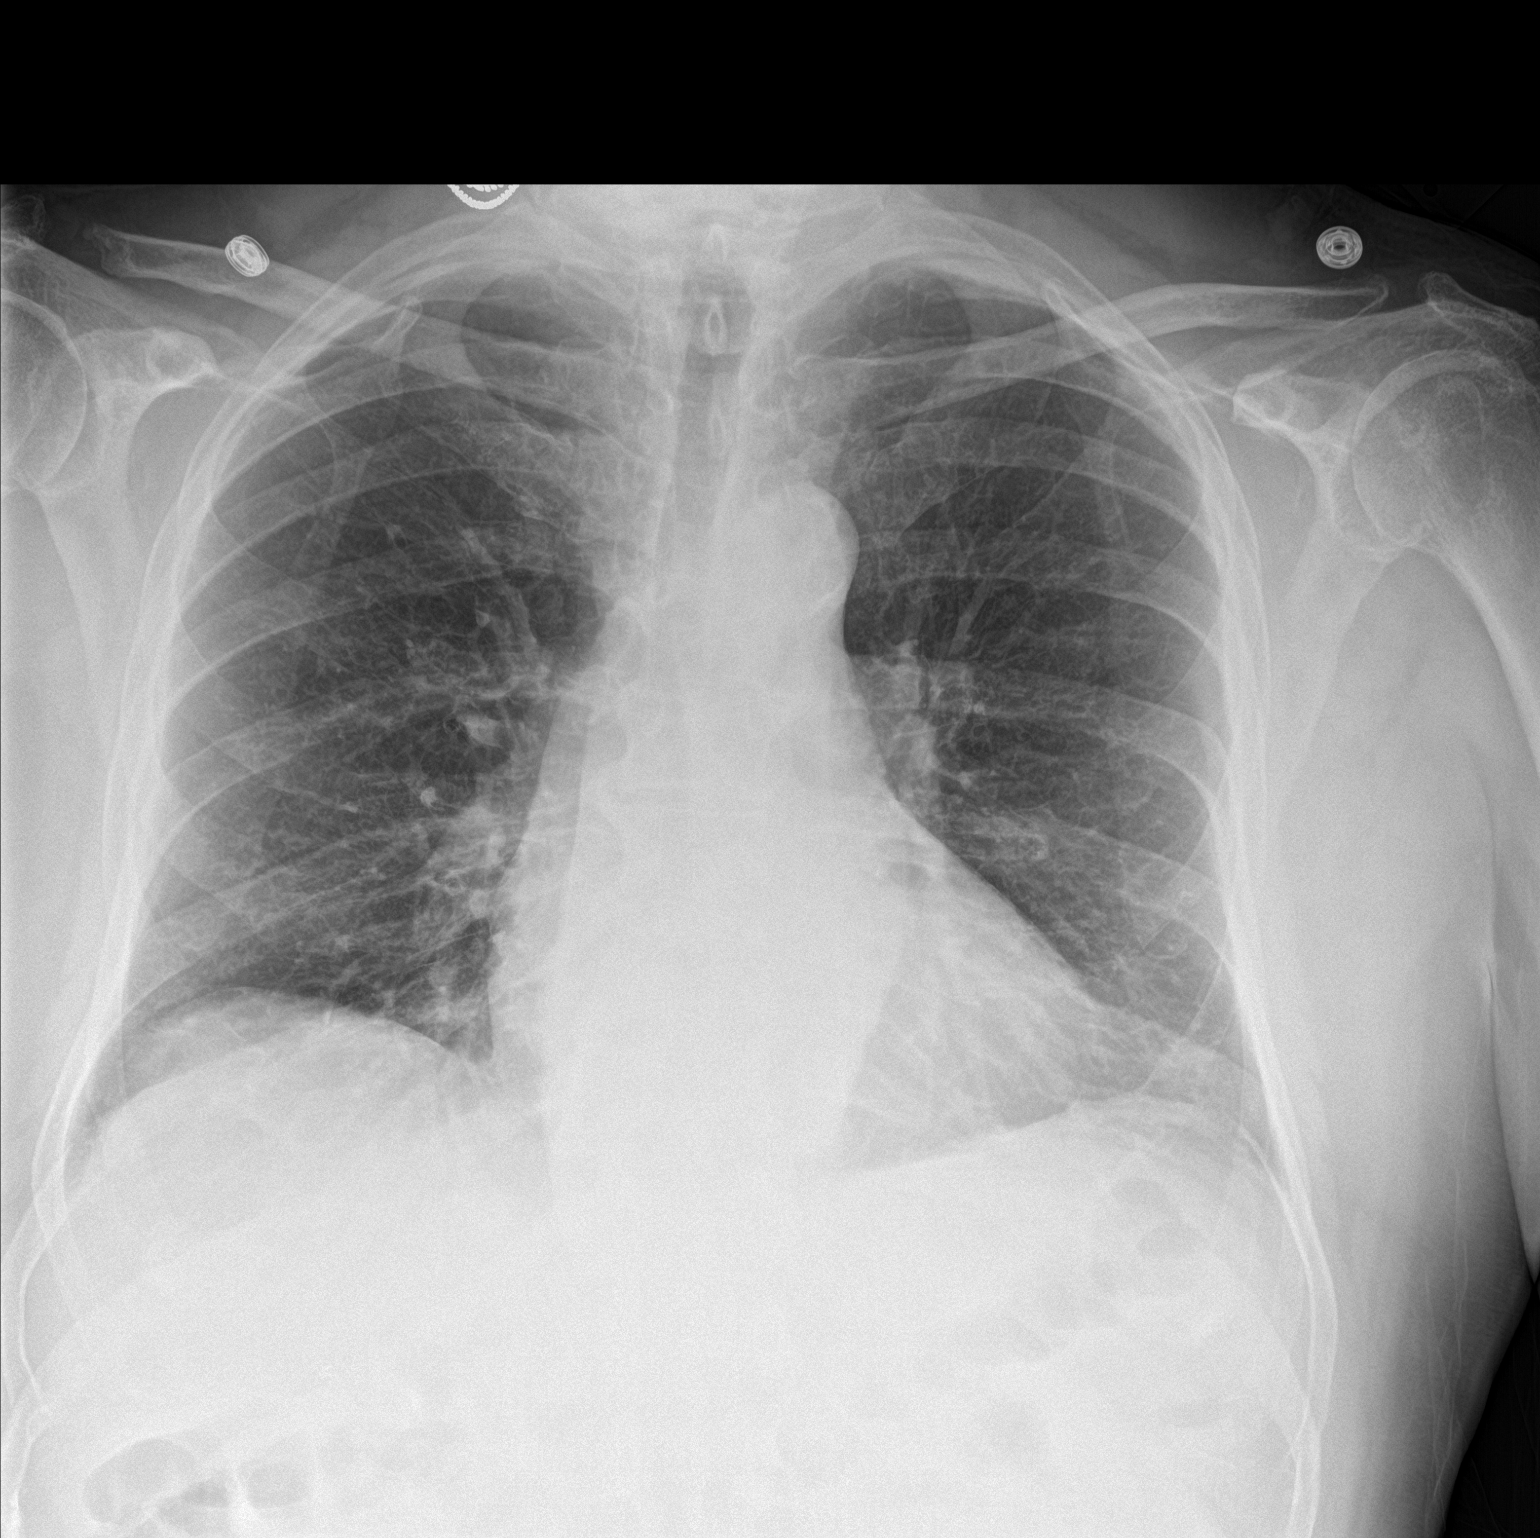

[chest lat]
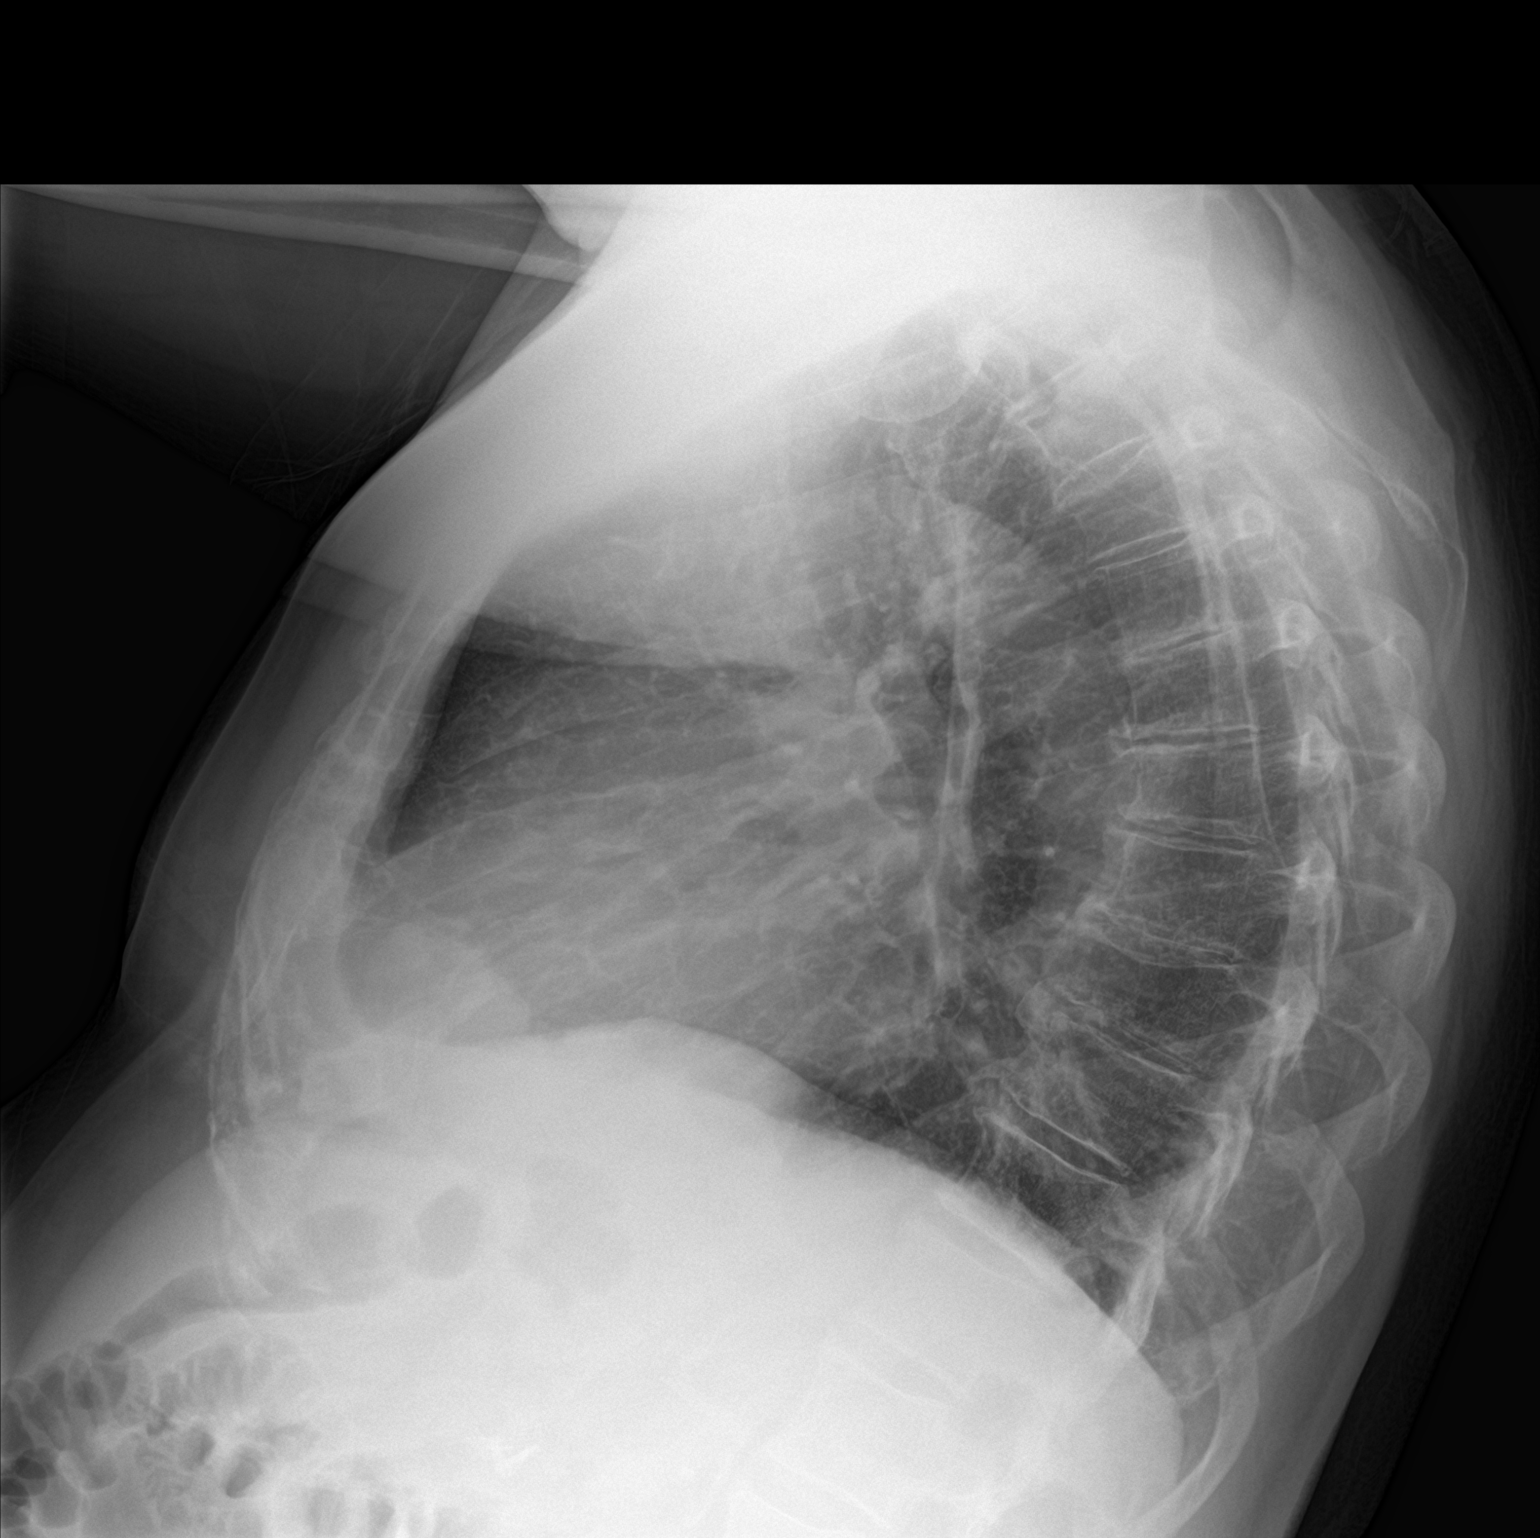

[2 of 2 positions shown; findings below may reference images not displayed]

FINDINGS: Minimal enlargement of cardiac silhouette.

Atherosclerotic calcification mild tortuosity of thoracic aorta.

Pulmonary vascularity normal.

Bronchitic changes without infiltrate, pleural effusion or
pneumothorax.

Bones appear demineralized.
IMPRESSION: Minimal enlargement of cardiac silhouette.

Bronchitic changes without infiltrate.

Aortic atherosclerosis.

## 2018-02-09 ENCOUNTER — Ambulatory Visit: Payer: Medicare Other | Admitting: Cardiovascular Disease

## 2018-02-09 ENCOUNTER — Encounter: Payer: Self-pay | Admitting: Cardiovascular Disease

## 2018-02-09 VITALS — BP 102/64 | HR 73 | Ht 71.0 in | Wt 213.0 lb

## 2018-02-09 DIAGNOSIS — Z955 Presence of coronary angioplasty implant and graft: Secondary | ICD-10-CM | POA: Diagnosis not present

## 2018-02-09 DIAGNOSIS — I4819 Other persistent atrial fibrillation: Secondary | ICD-10-CM | POA: Diagnosis not present

## 2018-02-09 DIAGNOSIS — I25118 Atherosclerotic heart disease of native coronary artery with other forms of angina pectoris: Secondary | ICD-10-CM | POA: Diagnosis not present

## 2018-02-09 DIAGNOSIS — I10 Essential (primary) hypertension: Secondary | ICD-10-CM | POA: Diagnosis not present

## 2018-02-09 DIAGNOSIS — E785 Hyperlipidemia, unspecified: Secondary | ICD-10-CM

## 2018-02-09 MED ORDER — CLOPIDOGREL BISULFATE 75 MG PO TABS: 75.0000 mg | ORAL_TABLET | Freq: Every day | ORAL | 3 refills | Status: DC

## 2018-02-09 NOTE — Patient Instructions (Addendum)
Medication Instructions: On March 1 st 2020, STOP Plavix And then START Aspirin 81 mg daily  If you need a refill on your cardiac medications before your next appointment, please call your pharmacy.   Lab work: None today  If you have labs (blood work) drawn today and your tests are completely normal, you will receive your results only by: Marland Kitchen MyChart Message (if you have MyChart) OR . A paper copy in the mail If you have any lab test that is abnormal or we need to change your treatment, we will call you to review the results.  Testing/Procedures: None today  Follow-Up: At [facility], you and your health needs are our priority.  As part of our continuing mission to provide you with exceptional heart care, we have created designated Provider Care Teams.  These Care Teams include your primary Cardiologist (physician) and Advanced Practice Providers (APPs -  Physician Assistants and Nurse Practitioners) who all work together to provide you with the care you need, when you need it. You will need a follow up appointment in 6 months.  Please call our office 2 months in advance to schedule this appointment.  You may see Kate Sable, MD or one of the following Advanced Practice Providers on your designated Care Team:   Bernerd Pho, PA-C [facility]) . Ermalinda Barrios, PA-C ([place])  Any Other Special Instructions Will Be Listed Below (If Applicable). None

## 2018-02-09 NOTE — Progress Notes (Signed)
SUBJECTIVE: The patient presents for follow-up of coronary artery disease and atrial fibrillation. He was hospitalized in 2019 in [place], [place]. He presented to a hospital there with unstable angina. He underwent a nuclear stress test which demonstrated anterior ischemia. He then underwent cardiac catheterization which demonstrated severe stenosis of the ostium of the RCA. The stent did not appear to be fully expanded. This was successfully treated with balloon angioplasty. He underwent FFR of the LAD which demonstrated nonobstructive disease.  He very seldom gets "chest twinges ".  He denies exertional dyspnea and exertional fatigue.  He denies palpitations, leg swelling, orthopnea, lightheadedness, and paroxysmal nocturnal dyspnea.  He and his wife stay at [facility] about 3 weeks out of each month.  They are active in their church there.  They tell me about their son who has been divorced for nearly 2 years.  They are upset that they no longer get to see their grandchildren.     Review of Systems: As per "subjective", otherwise negative.  Allergies  Allergen Reactions  . Sulfa Antibiotics Rash    Current Outpatient Medications  Medication Sig Dispense Refill  . apixaban (ELIQUIS) 5 MG TABS tablet Take 1 tablet (5 mg total) by mouth 2 (two) times daily. 180 tablet 3  . atenolol (TENORMIN) 50 MG tablet TAKE 1 TABLET BY MOUTH ONCE DAILY 90 tablet 0  . atorvastatin (LIPITOR) 40 MG tablet Take 1 tablet (40 mg total) by mouth daily. 90 tablet 3  . clopidogrel (PLAVIX) 75 MG tablet Take 1 tablet (75 mg total) by mouth daily. 90 tablet 3  . nitroGLYCERIN (NITROSTAT) 0.4 MG SL tablet Place 1 tablet (0.4 mg total) under the tongue every 5 (five) minutes x 3 doses as needed for chest pain. 30 tablet 3  . sertraline (ZOLOFT) 100 MG tablet Take 1 tablet (100 mg total) by mouth daily. 90 tablet 1   No current facility-administered medications for this visit.     Past Medical History:   Diagnosis Date  . Acute meniscal tear of left knee   . Anginal pain ([place])    comes and goes; uses rest to relieve   . Anticoagulant long-term use   . Anxiety   . At risk for sleep apnea    STOP-BANG= 4       SENT TO PCP 05-04-2014  . Atrial fibrillation [facility])    new on-set Jan 2016--  with good rate control on atenolol  per cardiologist note Dr Bronson Ing 04-09-2014  . CAD, multiple vessel cardiologist--  dr Bronson Ing   3 vessel CAD //  s/p DES x1 to ostial RCA 06-17-2012  . Depression   . Dysrhythmia   . History of adenomatous polyp of colon    2009  . Hyperlipidemia   . Hypertension   . OA (osteoarthritis) of knee   . S/P drug eluting coronary stent placement    RCA x1  06-17-2012  . Shortness of breath dyspnea    with exercise     Past Surgical History:  Procedure Laterality Date  . CARDIAC CATHETERIZATION  06-15-2012   dr Angelena Form   Triple vessel CAD with severe ostial RCA stenosis, severe stenosis in the small caliber diagonal branch and small caliber OM1, OM2 branches/  preserved LVSF  . COLONOSCOPY  last one 07-01-2010   Dr. Madolyn Frieze. mild diveticulosis of left colon, small internal hemorrhoids. next tcs in 5 years due to h/o colon polyps.  . COLONOSCOPY N/A 12/18/2015   Procedure:  COLONOSCOPY;  Surgeon: Daneil Dolin, MD;  Location: [facility];  Service: Endoscopy;  Laterality: N/A;  1:30 pm  . KNEE ARTHROSCOPY Right 2005  (approx)  . KNEE ARTHROSCOPY Left 05/08/2014   Procedure: LEFT ARTHROSCOPY KNEE WIGHT DEBRIDEMENT PARTIAL LATERAL MENISCECTOMY AND CHONDROPLASTY;  Surgeon: Sydnee Cabal, MD;  Location: [facility];  Service: Orthopedics;  Laterality: Left;  . LAPAROSCOPIC CHOLECYSTECTOMY  2011   AND REPAIR UMBILICAL HERNIA  . LEFT ELBOW SURGERY  1969  . PERCUTANEOUS CORONARY STENT INTERVENTION (PCI-S) N/A 06/17/2012   Procedure: PERCUTANEOUS CORONARY STENT INTERVENTION (PCI-S);  Surgeon: Burnell Blanks, MD;  Location: [facility] CATH LAB;   Service: Cardiovascular;  Laterality: N/A;  DES x1 ostial RCA  . SHOULDER ARTHROSCOPY WITH ROTATOR CUFF REPAIR Bilateral x2 left//   x1 right (last one , left in 2001)  . TONSILLECTOMY  1970  . TOTAL HIP ARTHROPLASTY Right 05-25-2006  . TOTAL KNEE ARTHROPLASTY Left 01/03/2015   Procedure: TOTAL KNEE ARTHROPLASTY;  Surgeon: Sydnee Cabal, MD;  Location: [facility];  Service: Orthopedics;  Laterality: Left;  . TRANSTHORACIC ECHOCARDIOGRAM  02-08-2014   mild focal basal hypertrophy of the septum, ef 55-60%,  indeterminate diastolic function in setting of atrial fib./  possibly bicuspid AV, mild calcification without stenosis, trivial AR/  mild MR and PR/  moderate to severe LAE/  trivial PR/  moderate RAE  . trauma to left finger     partially amputation secondary to lawnmower accident   . VASECTOMY  1970'sj  w/  general anesthesia    Social History   Socioeconomic History  . Marital status: Married    Spouse name: Not on file  . Number of children: Not on file  . Years of education: Not on file  . Highest education level: Not on file  Occupational History  . Not on file  Social Needs  . Financial resource strain: Not on file  . Food insecurity:    Worry: Not on file    Inability: Not on file  . Transportation needs:    Medical: Not on file    Non-medical: Not on file  Tobacco Use  . Smoking status: Former Smoker    Packs/day: 0.50    Years: 8.00    Pack years: 4.00    Types: Cigarettes    Start date: 02/02/1959    Last attempt to quit: 01/27/1968    Years since quitting: 50.0  . Smokeless tobacco: Former Systems developer    Types: Ivor date: 01/26/1970  Substance and Sexual Activity  . Alcohol use: No    Alcohol/week: 0.0 standard drinks    Comment: RARE  . Drug use: No  . Sexual activity: Not on file  Lifestyle  . Physical activity:    Days per week: Not on file    Minutes per session: Not on file  . Stress: Not on file  Relationships  . Social connections:    Talks on phone:  Not on file    Gets together: Not on file    Attends religious service: Not on file    Active member of club or organization: Not on file    Attends meetings of clubs or organizations: Not on file    Relationship status: Not on file  . Intimate partner violence:    Fear of current or ex partner: Not on file    Emotionally abused: Not on file    Physically abused: Not on file    Forced sexual  activity: Not on file  Other Topics Concern  . Not on file  Social History Narrative   [employer], retired     Vitals:   02/09/18 1455  BP: 102/64  Pulse: 73  SpO2: 98%  Weight: 213 lb (96.6 kg)  Height: 5\' 11"  (1.803 m)    Wt Readings from Last 3 Encounters:  02/09/18 213 lb (96.6 kg)  01/07/18 217 lb (98.4 kg)  08/13/17 214 lb (97.1 kg)     PHYSICAL EXAM General: NAD HEENT: Normal. Neck: No JVD, no thyromegaly. Lungs: Clear to auscultation bilaterally with normal respiratory effort. CV: Regular rate and irregular rhythm, normal S1/S2, no S3, no murmur. No pretibial or periankle edema.  No carotid bruit.   Abdomen: Soft, nontender, no distention.  Neurologic: Alert and oriented.  Psych: Normal affect. Skin: Normal. Musculoskeletal: No gross deformities.    ECG: Reviewed above under Subjective   Labs: Lab Results  Component Value Date/Time   K 4.9 01/07/2018 11:01 AM   BUN 13 01/07/2018 11:01 AM   CREATININE 1.10 01/07/2018 11:01 AM   ALT 29 01/07/2018 11:01 AM   TSH 1.279 01/31/2014 12:11 PM   HGB 16.2 01/07/2018 11:01 AM     Lipids: Lab Results  Component Value Date/Time   LDLCALC 51 01/07/2018 11:01 AM   CHOL 96 01/07/2018 11:01 AM   TRIG 85 01/07/2018 11:01 AM   HDL 28 (L) 01/07/2018 11:01 AM      Coronary Angiography(02/2017): Left Main: Mild calcification without obstruction. It divides into the LAD, LCX LAD: Medium caliber vessel that supplies the apex. Diffuse moderate calcification throughout the proximal 3rd of the vessel. At the level  of the 1st diagonal branch, there is a hazy plaque that appears to be nonobstructive on the order of 30% stenosis. The small caliber diagonal branch has a 99% proximal stenosis with TIMI 2 flow. The mid and distal LAD segments have minor luminal irregularities. LCX: Medium caliber vessel. The main body of the circumflex has no significant disease. A small OM 1 has a proximal focal 99% stenosis. The posterolateral branch has a proximal 99% stenosis. It is a small caliber vessel. RCA: Large caliber dominant vessel with a significant distribution. The ostial stent is patent. The portion of the stent located at the ostium does not appear to be completely expanded and there is severe stenosis. The remainder of the stent is widely patent. The mid RCA has a 30% eccentric plaque. A focal 30% distal stenosis. The posterolateral branch has an extensive distribution with no significant disease. The PDA branch is a medium-sized vessel with no significant disease.  Impression:  Severe stenosis at the ostium of the RCA. The DES does not appear to be completely expanded. The remainder of the stent is widely patent  Branch vessel obstructive disease with 99% lesions of the diagonal, OM1, posterolateral branch of the LCX. These were described on the cardiac catheterization done in 2014 in [place].  Questionable stenosis of the proximal LAD. Fairly heavily calcified area.  Plan: PCI of the ostial RCA stent FFR LAD Consider PCI of the circumflex branches The diagonal branch is too small for PCI   Coronary angiography, FFR LAD. On 03/17/17  Informed consent was obtained. Risks including stroke, death, heart attack, kidney failure, bruising, bleeding, vascular injury, need for emergent surgery were explained to the patient and the patient agreed to proceed. The patient was brought to the cath lab and prepped and draped in the usual sterile fashion. The left groin  was anesthetized with 2% lidocaine and the left  femoral artery was cannulated via the modified Seldinger technique. A 5 French sheath was placed. A JR 3.5 catheter were utilized to take angiographic images of the RCA. Then, a 5 [place] EBU 3.75 guide catheter was used for the FFR portion of the procedure. Heparin was administered for this portion of the study. At the conclusion of the procedure the arterial sheath was sutured in place and he was transferred to the holding area in stable condition There were no complications.   Findings: Aortic Pressure: 132/68  RCA: The site of intervention yesterday at the RCA ostium was widely patent. No dampening of the pressure waveform with catheter engagement. TIMI 3 flow.  LAD FFR: FFR value= 0.85, consistent with nonobstructive disease  Impression: 1. Continued wide patency of the ostial RCA POBA site within the the DES implanted 2014. 2. FFR lad consistent with nonobstructive disease   Plan: Continue medical management of branch vessel obstructive disease. He has done well with that over the past 4 years. Resume apixaban anticoagulation tonight. Continue clopidogrel 75 mg daily. No aspirin Probable discharge later today, with follow-up with his local cardiologist in [place]: 1. Coronary artery disease with ostial RCA angioplasty in February 2019 within the drug-eluting stent implanted in 2014: Symptomatically stable. It is important to note that episodic chest pains lasting seconds both at rest and with exertion are his typical anginal symptoms.  Currently on Plavix and Lipitor.    Not on aspirin as he is on Eliquis.  In March 2020, I will stop Plavix and resume aspirin 81 mg daily.  2.Long-standing persistentatrial fibrillation:Heart rate is well controlled on atenolol 50 mg daily. Continue Eliquis for anticoagulation.  3. Essential MYT:RZNBVAPOLI on present therapy. No changes.  4. Hyperlipidemia: Continue Lipitor 40 mg.  LDL 51 in December 2019.   Full panel reviewed above.  5. Possible bicuspid aortic valve:No need for additional imaging at this time. No murmur on physical exam. I would consider repeat echocardiography in the future when deemed clinically indicated.     Disposition: Follow up 6 months   Kate Sable, M.D., F.A.C.C.

## 2018-04-19 ENCOUNTER — Other Ambulatory Visit: Payer: Self-pay | Admitting: Family Medicine

## 2018-05-13 ENCOUNTER — Other Ambulatory Visit: Payer: Self-pay | Admitting: Cardiovascular Disease

## 2018-06-04 ENCOUNTER — Other Ambulatory Visit: Payer: Self-pay | Admitting: Cardiovascular Disease

## 2018-06-07 ENCOUNTER — Other Ambulatory Visit: Payer: Self-pay | Admitting: Cardiovascular Disease

## 2018-07-22 ENCOUNTER — Other Ambulatory Visit: Payer: Self-pay | Admitting: Family Medicine

## 2018-08-19 ENCOUNTER — Encounter: Payer: Self-pay | Admitting: Family Medicine

## 2018-08-19 ENCOUNTER — Other Ambulatory Visit: Payer: Self-pay

## 2018-08-19 ENCOUNTER — Ambulatory Visit (INDEPENDENT_AMBULATORY_CARE_PROVIDER_SITE_OTHER): Payer: Medicare Other | Admitting: Family Medicine

## 2018-08-19 VITALS — BP 124/62 | HR 70 | Temp 98.7°F | Resp 16 | Ht 71.0 in | Wt 223.0 lb

## 2018-08-19 DIAGNOSIS — I25118 Atherosclerotic heart disease of native coronary artery with other forms of angina pectoris: Secondary | ICD-10-CM | POA: Diagnosis not present

## 2018-08-19 DIAGNOSIS — E7800 Pure hypercholesterolemia, unspecified: Secondary | ICD-10-CM

## 2018-08-19 DIAGNOSIS — I10 Essential (primary) hypertension: Secondary | ICD-10-CM

## 2018-08-19 DIAGNOSIS — Z0000 Encounter for general adult medical examination without abnormal findings: Secondary | ICD-10-CM

## 2018-08-19 DIAGNOSIS — F418 Other specified anxiety disorders: Secondary | ICD-10-CM

## 2018-08-19 DIAGNOSIS — Z0001 Encounter for general adult medical examination with abnormal findings: Secondary | ICD-10-CM

## 2018-08-19 DIAGNOSIS — Z125 Encounter for screening for malignant neoplasm of prostate: Secondary | ICD-10-CM

## 2018-08-19 MED ORDER — SERTRALINE HCL 100 MG PO TABS: 100.0000 mg | ORAL_TABLET | Freq: Every day | ORAL | 1 refills | Status: DC

## 2018-08-19 NOTE — Patient Instructions (Addendum)
F/U 6 monthd

## 2018-08-19 NOTE — Progress Notes (Signed)
Subjective:   Patient presents for Medicare Annual/Subsequent preventive examination.   No concerns    Review Past Medical/Family/Social: per emr    Risk Factors  Current exercise habits: walks Dietary issues discussed:   Cardiac risk factors:Known CAD   Depression Screen  (Note: if answer to either of the following is "Yes", a more complete depression screening is indicated)  Over the past two weeks, have you felt down, depressed or hopeless? No Over the past two weeks, have you felt little interest or pleasure in doing things? No Have you lost interest or pleasure in daily life? No Do you often feel hopeless? No Do you cry easily over simple problems? No   Activities of Daily Living  In your present state of health, do you have any difficulty performing the following activities?:  Driving? No  Managing money? No  Feeding yourself? No  Getting from bed to chair? No  Climbing a flight of stairs? No  Preparing food and eating?: No  Bathing or showering? No  Getting dressed: No  Getting to the toilet? No  Using the toilet:No  Moving around from place to place: No  In the past year have you fallen or had a near fall?:No  Are you sexually active? No  Do you have more than one partner? No   Hearing Difficulties: No  Do you often ask people to speak up or repeat themselves? No  Do you experience ringing or noises in your ears? No Do you have difficulty understanding soft or whispered voices? No  Do you feel that you have a problem with memory? No Do you often misplace items? No  Do you feel safe at home? Yes  Cognitive Testing  Alert? Yes Normal Appearance?Yes  Oriented to person? Yes Place? Yes  Time? Yes  Recall of three objects? Yes  Can perform simple calculations? Yes  Displays appropriate judgment?Yes  Can read the correct time from a watch face?Yes   List the Names of Other Physician/Practitioners you currently use:  Cardiology , orthopedics [facility] -  yearly    Screening Tests / Date UTD  Colonoscopy  UTD                   Zostavax/shingrix   UTD  Pneumonia- UTD Influenza Vaccine  Tetanus/tdap UTD   ROS:  GEN- denies fatigue, fever, weight loss,weakness, recent illness HEENT- denies eye drainage, change in vision, nasal discharge, CVS- denies chest pain, palpitations RESP- denies SOB, cough, wheeze ABD- denies N/V, change in stools, abd pain GU- denies dysuria, hematuria, dribbling, incontinence MSK- denies joint pain, muscle aches, injury Neuro- denies headache, dizziness, syncope, seizure activity  Physical: VITALS REVIEWED  GEN- NAD, alert and oriented x3 HEENT- PERRL, EOMI, non injected sclera, pink conjunctiva, MMM, oropharynx clear Neck- Supple, no thryomegaly, no bruit  CVS- RRR, no murmur RESP-CTAB abd-nabs,SOFT,NT,ND EXT- No edema Pulses- Radial, DP- 2+   Assessment:    Annual wellness medicare exam   Plan:    During the course of the visit the patient was educated and counseled about appropriate screening and preventive services including:     Audit C/Fall/MDD - NEGATIVE   Prevention UTD, Discussed PSA screening pt would like to have done   CAD/AFIB- BP controlled asymptomatic, he did ask about using viagra, advised to discuss with his cardiologist next month    Meds reviewed, no changes   Fasting labs done  Pt has advanced directives   Does well on zoloft to control mood- chronic  med no changes          Diet review for nutrition referral? Yes ____ Not Indicated __x__  Patient Instructions (the written plan) was given to the patient.  Medicare Attestation  I have personally reviewed:  The patient's medical and social history  Their use of alcohol, tobacco or illicit drugs  Their current medications and supplements  The patient's functional ability including ADLs,fall risks, home safety risks, cognitive, and hearing and visual impairment  Diet and physical activities  Evidence for  depression or mood disorders  The patient's weight, height, BMI, and visual acuity have been recorded in the chart. I have made referrals, counseling, and provided education to the patient based on review of the above and I have provided the patient with a written personalized care plan for preventive services.

## 2018-08-20 LAB — CBC WITH DIFFERENTIAL/PLATELET
Absolute Monocytes: 676 cells/uL (ref 200–950)
Basophils Absolute: 50 cells/uL (ref 0–200)
Basophils Relative: 0.8 %
Eosinophils Absolute: 260 cells/uL (ref 15–500)
Eosinophils Relative: 4.2 %
HCT: 45.9 % (ref 38.5–50.0)
Hemoglobin: 15.7 g/dL (ref 13.2–17.1)
Lymphs Abs: 2195 cells/uL (ref 850–3900)
MCH: 34.3 pg — ABNORMAL HIGH (ref 27.0–33.0)
MCHC: 34.2 g/dL (ref 32.0–36.0)
MCV: 100.2 fL — ABNORMAL HIGH (ref 80.0–100.0)
MPV: 10.1 fL (ref 7.5–12.5)
Monocytes Relative: 10.9 %
Neutro Abs: 3019 cells/uL (ref 1500–7800)
Neutrophils Relative %: 48.7 %
Platelets: 204 10*3/uL (ref 140–400)
RBC: 4.58 10*6/uL (ref 4.20–5.80)
RDW: 12.8 % (ref 11.0–15.0)
Total Lymphocyte: 35.4 %
WBC: 6.2 10*3/uL (ref 3.8–10.8)

## 2018-08-20 LAB — COMPREHENSIVE METABOLIC PANEL
AG Ratio: 1.6 (calc) (ref 1.0–2.5)
ALT: 32 U/L (ref 9–46)
AST: 24 U/L (ref 10–35)
Albumin: 4.1 g/dL (ref 3.6–5.1)
Alkaline phosphatase (APISO): 47 U/L (ref 35–144)
BUN: 16 mg/dL (ref 7–25)
CO2: 26 mmol/L (ref 20–32)
Calcium: 9.3 mg/dL (ref 8.6–10.3)
Chloride: 108 mmol/L (ref 98–110)
Creat: 1.16 mg/dL (ref 0.70–1.18)
Globulin: 2.6 g/dL (calc) (ref 1.9–3.7)
Glucose, Bld: 97 mg/dL (ref 65–99)
Potassium: 4.8 mmol/L (ref 3.5–5.3)
Sodium: 142 mmol/L (ref 135–146)
Total Bilirubin: 0.9 mg/dL (ref 0.2–1.2)
Total Protein: 6.7 g/dL (ref 6.1–8.1)

## 2018-08-20 LAB — PSA: PSA: 0.7 ng/mL (ref <=4.0)

## 2018-08-20 LAB — LIPID PANEL
Cholesterol: 90 mg/dL (ref <200)
HDL: 30 mg/dL — ABNORMAL LOW (ref >=40)
LDL Cholesterol (Calc): 43 mg/dL (calc)
Non-HDL Cholesterol (Calc): 60 mg/dL (calc) (ref <130)
Total CHOL/HDL Ratio: 3 (calc) (ref <5.0)
Triglycerides: 86 mg/dL (ref <150)

## 2018-08-24 ENCOUNTER — Encounter: Payer: Self-pay | Admitting: *Deleted

## 2018-09-26 ENCOUNTER — Other Ambulatory Visit: Payer: Self-pay

## 2018-09-26 ENCOUNTER — Encounter: Payer: Self-pay | Admitting: Cardiovascular Disease

## 2018-09-26 ENCOUNTER — Ambulatory Visit (INDEPENDENT_AMBULATORY_CARE_PROVIDER_SITE_OTHER): Payer: Medicare Other | Admitting: Cardiovascular Disease

## 2018-09-26 VITALS — BP 117/70 | HR 59 | Temp 97.1°F | Ht 72.0 in | Wt 224.0 lb

## 2018-09-26 DIAGNOSIS — Z955 Presence of coronary angioplasty implant and graft: Secondary | ICD-10-CM

## 2018-09-26 DIAGNOSIS — N529 Male erectile dysfunction, unspecified: Secondary | ICD-10-CM

## 2018-09-26 DIAGNOSIS — I10 Essential (primary) hypertension: Secondary | ICD-10-CM | POA: Diagnosis not present

## 2018-09-26 DIAGNOSIS — I25118 Atherosclerotic heart disease of native coronary artery with other forms of angina pectoris: Secondary | ICD-10-CM

## 2018-09-26 DIAGNOSIS — I4819 Other persistent atrial fibrillation: Secondary | ICD-10-CM

## 2018-09-26 DIAGNOSIS — E785 Hyperlipidemia, unspecified: Secondary | ICD-10-CM

## 2018-09-26 MED ORDER — SILDENAFIL CITRATE 50 MG PO TABS: 50.0000 mg | ORAL_TABLET | Freq: Every day | ORAL | 1 refills | Status: DC | PRN
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
## 2018-09-26 NOTE — Patient Instructions (Addendum)
Medication Instructions:Take Sildenafil as directed   Labwork:None   Procedures/Testing:None   Follow-Up:6 months with Dr.Koneswaran   Any Additional Special Instructions Will Be Listed Below (If Applicable).     If you need a refill on your cardiac medications before your next appointment, please call your pharmacy.      Thank you for choosing [facility] !

## 2018-10-10 ENCOUNTER — Other Ambulatory Visit: Payer: Self-pay | Admitting: Family Medicine

## 2019-01-09 ENCOUNTER — Other Ambulatory Visit: Payer: Self-pay | Admitting: Cardiovascular Disease

## 2019-01-25 ENCOUNTER — Other Ambulatory Visit: Payer: Self-pay | Admitting: Family Medicine

## 2019-02-20 ENCOUNTER — Encounter: Payer: Self-pay | Admitting: Family Medicine

## 2019-02-20 ENCOUNTER — Other Ambulatory Visit: Payer: Self-pay

## 2019-02-20 ENCOUNTER — Ambulatory Visit (INDEPENDENT_AMBULATORY_CARE_PROVIDER_SITE_OTHER): Payer: Medicare Other | Admitting: Family Medicine

## 2019-02-20 VITALS — BP 120/74 | HR 65 | Temp 97.5°F | Resp 16 | Wt 224.2 lb

## 2019-02-20 DIAGNOSIS — I25118 Atherosclerotic heart disease of native coronary artery with other forms of angina pectoris: Secondary | ICD-10-CM | POA: Diagnosis not present

## 2019-02-20 DIAGNOSIS — I10 Essential (primary) hypertension: Secondary | ICD-10-CM

## 2019-02-20 DIAGNOSIS — H35033 Hypertensive retinopathy, bilateral: Secondary | ICD-10-CM | POA: Diagnosis not present

## 2019-02-20 DIAGNOSIS — I480 Paroxysmal atrial fibrillation: Secondary | ICD-10-CM | POA: Diagnosis not present

## 2019-02-20 DIAGNOSIS — M25552 Pain in left hip: Secondary | ICD-10-CM

## 2019-02-20 DIAGNOSIS — F418 Other specified anxiety disorders: Secondary | ICD-10-CM

## 2019-02-20 DIAGNOSIS — H5202 Hypermetropia, left eye: Secondary | ICD-10-CM | POA: Diagnosis not present

## 2019-02-20 DIAGNOSIS — E7800 Pure hypercholesterolemia, unspecified: Secondary | ICD-10-CM | POA: Diagnosis not present

## 2019-02-20 MED ORDER — TRAMADOL HCL 50 MG PO TABS: 50.0000 mg | ORAL_TABLET | Freq: Three times a day (TID) | ORAL | 0 refills | Status: DC | PRN

## 2019-02-20 NOTE — Progress Notes (Signed)
Subjective:    Patient ID: Eddie Price, male    DOB: 09-May-1940, 79 y.o.   MRN: [id number]  Patient presents for Hypertension  Patient here to follow-up chronic medical problems. Medications reviewed.  Mood disorder he continues on Zoloft he is not have any concerns with the medication.  Coronary artery disease/atrial fibrillation /hypertension -he is on beta-blocker atenolol which helps control his rate. He is also on Eliquis for anticoagulation and aspirin 81 mg.  When he bends over gets SOB, he still gets some exertional angina. He is able to do some yard work for 15-20 minutes to take a rest   He is on statin drug lipitor without any difficulty   He had a visit with cardiology in August which I reviewed Erectile dysfunction he is on Viagra prescribed by cardiology  Wife states he jerks around in his sleep, doesn't sleep well, talks in the sleep He takes his legs around mostly he does have chronic leg pain and chronic left knee pain.  He has history of knee replacement.  He does not like to take medications for it.  He also segued into an injury he had over the summer which is caused some left hip pain as well.  Back in July he got hisleft foot cauht beneath the lawnmower and he fell, he had a large bruise on left lower leg that took a while to resolve He also injured his left hip at that time as he fell onto one of the brackets of the lawnmower.  Now when he is out doing activities or walking a lot he will get pain in his left hip on the side as well as into the buttocks.  He denies any low back pain denies any radiating symptoms down the leg denies any tingling or numbness in the foot.  -Request [facility]     Review Of Systems:  GEN- denies fatigue, fever, weight loss,weakness, recent illness HEENT- denies eye drainage, change in vision, nasal discharge, CVS- denies chest pain, palpitations RESP- +SOB, cough, wheeze ABD- denies N/V, change in stools, abd  pain GU- denies dysuria, hematuria, dribbling, incontinence MSK- + joint pain, muscle aches, injury Neuro- denies headache, dizziness, syncope, seizure activity       Objective:    BP 120/74   Pulse 65   Temp (!) 97.5 F (36.4 C) (Temporal)   Resp 16   Wt 224 lb 4 oz (101.7 kg)   SpO2 97%   BMI 30.41 kg/m  GEN- NAD, alert and oriented x3 HEENT- PERRL, EOMI, non injected sclera, pink conjunctiva, MMM, oropharynx clear Neck- Supple, no thyromegaly CVS-regular rhythm normal rate no murmur RESP-CTAB ABD-NABS,soft,NT,ND MSK-fair range of motion of bilateral hips spine knees.  Tender to palpation over left hip joint.  Pain with internal rotation left side.  Negative straight leg raise NEURO Strength intact bilateral lower extremities sensation grossly intact EXT- No edema Pulses- Radial, 2+        Assessment & Plan:      Problem List Items Addressed This Visit      Unprioritized   Atrial fibrillation ([place]) (Chronic)   Coronary atherosclerosis of native coronary artery (Chronic)    Coronary artery disease with a stent as well as atrial fibrillation.  He is following with his cardiology.  I query if he needs another stress test since he gets symptoms when he is exerting himself will defer this to cardiology of course.  We will check his lipid panel today and his metabolic  panel.   I do query some possible sleep apnea symptoms.  Wife states that he gets shallow breathing at times he also has the jerking motions which may correspond with some restless leg syndrome which is abnormal limb movements.  We did discuss possible sleep study he wants to think about this for now.      Relevant Orders   Lipid panel   Depression with anxiety    Continue Zoloft as prescribed.      Essential hypertension, benign - Primary (Chronic)    Blood pressure control no changes.      Relevant Orders   CBC with Differential/Platelet   Comprehensive metabolic panel   Hyperlipidemia  (Chronic)    Other Visit Diagnoses    Left hip pain       Left hip pain concerned that he has DJD possible injury or break/ chip in the past, chronic prescribed him tramadol to use as needed for leg pain this is also the same side of his previous knee surgery so there could be some alignment issues.  We will schedule an appointment with orthopedics      Note: This dictation was prepared with Dragon dictation along with smaller phrase technology. Any transcriptional errors that result from this process are unintentional.

## 2019-02-20 NOTE — Patient Instructions (Addendum)
F/U 6 months for Physical Consider the sleep study  Referral to orthopedics

## 2019-02-20 NOTE — Assessment & Plan Note (Signed)
Coronary artery disease with a stent as well as atrial fibrillation.  He is following with his cardiology.  I query if he needs another stress test since he gets symptoms when he is exerting himself will defer this to cardiology of course.  We will check his lipid panel today and his metabolic panel.   I do query some possible sleep apnea symptoms.  Wife states that he gets shallow breathing at times he also has the jerking motions which may correspond with some restless leg syndrome which is abnormal limb movements.  We did discuss possible sleep study he wants to think about this for now.

## 2019-02-20 NOTE — Assessment & Plan Note (Signed)
Continue Zoloft as prescribed.

## 2019-02-20 NOTE — Assessment & Plan Note (Signed)
Blood pressure control no changes. 

## 2019-02-21 LAB — COMPREHENSIVE METABOLIC PANEL
AG Ratio: 1.4 (calc) (ref 1.0–2.5)
ALT: 30 U/L (ref 9–46)
AST: 25 U/L (ref 10–35)
Albumin: 4.2 g/dL (ref 3.6–5.1)
Alkaline phosphatase (APISO): 61 U/L (ref 35–144)
BUN: 10 mg/dL (ref 7–25)
CO2: 25 mmol/L (ref 20–32)
Calcium: 9.2 mg/dL (ref 8.6–10.3)
Chloride: 106 mmol/L (ref 98–110)
Creat: 1.12 mg/dL (ref 0.70–1.18)
Globulin: 3 g/dL (calc) (ref 1.9–3.7)
Glucose, Bld: 101 mg/dL — ABNORMAL HIGH (ref 65–99)
Potassium: 5.1 mmol/L (ref 3.5–5.3)
Sodium: 140 mmol/L (ref 135–146)
Total Bilirubin: 0.8 mg/dL (ref 0.2–1.2)
Total Protein: 7.2 g/dL (ref 6.1–8.1)

## 2019-02-21 LAB — CBC WITH DIFFERENTIAL/PLATELET
Absolute Monocytes: 850 cells/uL (ref 200–950)
Basophils Absolute: 68 cells/uL (ref 0–200)
Basophils Relative: 1 %
Eosinophils Absolute: 272 cells/uL (ref 15–500)
Eosinophils Relative: 4 %
HCT: 46.4 % (ref 38.5–50.0)
Hemoglobin: 15.5 g/dL (ref 13.2–17.1)
Lymphs Abs: 2122 cells/uL (ref 850–3900)
MCH: 32.4 pg (ref 27.0–33.0)
MCHC: 33.4 g/dL (ref 32.0–36.0)
MCV: 97.1 fL (ref 80.0–100.0)
MPV: 9.8 fL (ref 7.5–12.5)
Monocytes Relative: 12.5 %
Neutro Abs: 3488 cells/uL (ref 1500–7800)
Neutrophils Relative %: 51.3 %
Platelets: 222 10*3/uL (ref 140–400)
RBC: 4.78 10*6/uL (ref 4.20–5.80)
RDW: 12.4 % (ref 11.0–15.0)
Total Lymphocyte: 31.2 %
WBC: 6.8 10*3/uL (ref 3.8–10.8)

## 2019-02-21 LAB — LIPID PANEL
Cholesterol: 77 mg/dL (ref <200)
HDL: 28 mg/dL — ABNORMAL LOW (ref >=40)
LDL Cholesterol (Calc): 35 mg/dL (calc)
Non-HDL Cholesterol (Calc): 49 mg/dL (calc) (ref <130)
Total CHOL/HDL Ratio: 2.8 (calc) (ref <5.0)
Triglycerides: 67 mg/dL (ref <150)

## 2019-02-23 ENCOUNTER — Other Ambulatory Visit: Payer: Self-pay | Admitting: *Deleted

## 2019-02-23 NOTE — Telephone Encounter (Signed)
Received fax requesting clarification on Tramadol order.   Tramadol prescription SIG states q 8hrs PRN x5 days, but quantity is 2 tabs.   Requested to change prescription to #15 tabs.

## 2019-02-24 MED ORDER — TRAMADOL HCL 50 MG PO TABS: 50.0000 mg | ORAL_TABLET | Freq: Three times a day (TID) | ORAL | 0 refills | Status: AC | PRN

## 2019-04-02 ENCOUNTER — Other Ambulatory Visit: Payer: Self-pay | Admitting: Family Medicine

## 2019-05-09 ENCOUNTER — Telehealth: Payer: Self-pay | Admitting: *Deleted

## 2019-05-09 NOTE — Telephone Encounter (Signed)
Okay to send amoxicillin take 2000mg  po x 1 dose, 1 hour before procedure

## 2019-05-09 NOTE — Telephone Encounter (Signed)
Received call from patient.   Reports that he has upcoming dental procedure scheduled for deep cleaning. Requested prescription for prophylactic ABTx.   MD please advise.

## 2019-05-10 MED ORDER — AMOXICILLIN 500 MG PO TABS: 2000.0000 mg | ORAL_TABLET | Freq: Two times a day (BID) | ORAL | 0 refills | Status: DC

## 2019-05-10 NOTE — Telephone Encounter (Signed)
Call placed to patient and patient made aware.   Prescription sent to pharmacy.  

## 2019-05-12 ENCOUNTER — Ambulatory Visit (HOSPITAL_COMMUNITY)
Admission: RE | Admit: 2019-05-12 | Discharge: 2019-05-12 | Disposition: A | Discharge status: Home / Self Care | Payer: Medicare Other | Source: Ambulatory Visit | Attending: Cardiovascular Disease | Admitting: Cardiovascular Disease

## 2019-05-12 ENCOUNTER — Telehealth: Payer: Self-pay

## 2019-05-12 ENCOUNTER — Ambulatory Visit: Payer: Medicare Other | Admitting: Cardiovascular Disease

## 2019-05-12 ENCOUNTER — Other Ambulatory Visit (HOSPITAL_COMMUNITY)
Admission: RE | Admit: 2019-05-12 | Discharge: 2019-05-12 | Disposition: A | Discharge status: Home / Self Care | Payer: Medicare Other | Source: Ambulatory Visit | Attending: Cardiovascular Disease | Admitting: Cardiovascular Disease

## 2019-05-12 ENCOUNTER — Encounter: Payer: Self-pay | Admitting: Cardiovascular Disease

## 2019-05-12 ENCOUNTER — Other Ambulatory Visit: Payer: Self-pay

## 2019-05-12 VITALS — BP 124/72 | HR 70 | Temp 98.4°F | Ht 71.0 in | Wt 226.0 lb

## 2019-05-12 DIAGNOSIS — Z955 Presence of coronary angioplasty implant and graft: Secondary | ICD-10-CM

## 2019-05-12 DIAGNOSIS — R0600 Dyspnea, unspecified: Secondary | ICD-10-CM | POA: Diagnosis not present

## 2019-05-12 DIAGNOSIS — I25118 Atherosclerotic heart disease of native coronary artery with other forms of angina pectoris: Secondary | ICD-10-CM | POA: Diagnosis not present

## 2019-05-12 DIAGNOSIS — I4819 Other persistent atrial fibrillation: Secondary | ICD-10-CM

## 2019-05-12 DIAGNOSIS — R0602 Shortness of breath: Secondary | ICD-10-CM

## 2019-05-12 DIAGNOSIS — R0609 Other forms of dyspnea: Secondary | ICD-10-CM

## 2019-05-12 DIAGNOSIS — I10 Essential (primary) hypertension: Secondary | ICD-10-CM | POA: Diagnosis not present

## 2019-05-12 DIAGNOSIS — I359 Nonrheumatic aortic valve disorder, unspecified: Secondary | ICD-10-CM

## 2019-05-12 DIAGNOSIS — E785 Hyperlipidemia, unspecified: Secondary | ICD-10-CM

## 2019-05-12 LAB — BRAIN NATRIURETIC PEPTIDE: B Natriuretic Peptide: 224 pg/mL — ABNORMAL HIGH (ref 0.0–100.0)

## 2019-05-12 NOTE — Patient Instructions (Signed)
Medication Instructions:  Your physician recommends that you continue on your current medications as directed. Please refer to the Current Medication list given to you today.  *If you need a refill on your cardiac medications before your next appointment, please call your pharmacy*   Lab Work: BNP today If you have labs (blood work) drawn today and your tests are completely normal, you will receive your results only by: Marland Kitchen MyChart Message (if you have MyChart) OR . A paper copy in the mail If you have any lab test that is abnormal or we need to change your treatment, we will call you to review the results.   Testing/Procedures: Your physician has requested that you have an echocardiogram. Echocardiography is a painless test that uses sound waves to create images of your heart. It provides your doctor with information about the size and shape of your heart and how well your heart's chambers and valves are working. This procedure takes approximately one hour. There are no restrictions for this procedure.     Follow-Up: At [facility], you and your health needs are our priority.  As part of our continuing mission to provide you with exceptional heart care, we have created designated Provider Care Teams.  These Care Teams include your primary Cardiologist (physician) and Advanced Practice Providers (APPs -  Physician Assistants and Nurse Practitioners) who all work together to provide you with the care you need, when you need it.  We recommend signing up for the patient portal called "MyChart".  Sign up information is provided on this After Visit Summary.  MyChart is used to connect with patients for Virtual Visits (Telemedicine).  Patients are able to view lab/test results, encounter notes, upcoming appointments, etc.  Non-urgent messages can be sent to your provider as well.   To learn more about what you can do with MyChart, go to [email or web address].    Your next appointment:   6  week(s)  The format for your next appointment:   Virtual Visit   Provider:   Kate Sable, MD   Other Instructions None      Thank you for choosing [facility] !

## 2019-05-12 NOTE — Telephone Encounter (Signed)
  Patient Consent for Virtual Visit         Eddie Price has provided verbal consent on 05/12/2019 for a virtual visit (video or telephone).   CONSENT FOR VIRTUAL VISIT FOR:  Eddie Price  By participating in this virtual visit I agree to the following:  I hereby voluntarily request, consent and authorize [facility] and its employed or contracted physicians, physician assistants, nurse practitioners or other licensed health care professionals (the Practitioner), to provide me with telemedicine health care services (the "Services") as deemed necessary by the treating Practitioner. I acknowledge and consent to receive the Services by the Practitioner via telemedicine. I understand that the telemedicine visit will involve communicating with the Practitioner through live audiovisual communication technology and the disclosure of certain medical information by electronic transmission. I acknowledge that I have been given the opportunity to request an in-person assessment or other available alternative prior to the telemedicine visit and am voluntarily participating in the telemedicine visit.  I understand that I have the right to withhold or withdraw my consent to the use of telemedicine in the course of my care at any time, without affecting my right to future care or treatment, and that the Practitioner or I may terminate the telemedicine visit at any time. I understand that I have the right to inspect all information obtained and/or recorded in the course of the telemedicine visit and may receive copies of available information for a reasonable fee.  I understand that some of the potential risks of receiving the Services via telemedicine include:  Marland Kitchen Delay or interruption in medical evaluation due to technological equipment failure or disruption; . Information transmitted may not be sufficient (e.g. poor resolution of images) to allow for appropriate medical decision making by the Practitioner;  and/or  . In rare instances, security protocols could fail, causing a breach of personal health information.  Furthermore, I acknowledge that it is my responsibility to provide information about my medical history, conditions and care that is complete and accurate to the best of my ability. I acknowledge that Practitioner's advice, recommendations, and/or decision may be based on factors not within their control, such as incomplete or inaccurate data provided by me or distortions of diagnostic images or specimens that may result from electronic transmissions. I understand that the practice of medicine is not an exact science and that Practitioner makes no warranties or guarantees regarding treatment outcomes. I acknowledge that a copy of this consent can be made available to me via my patient portal ([facility]), or I can request a printed copy by calling the office of [facility].    I understand that my insurance will be billed for this visit.   I have read or had this consent read to me. . I understand the contents of this consent, which adequately explains the benefits and risks of the Services being provided via telemedicine.  . I have been provided ample opportunity to ask questions regarding this consent and the Services and have had my questions answered to my satisfaction. . I give my informed consent for the services to be provided through the use of telemedicine in my medical care

## 2019-05-12 NOTE — Telephone Encounter (Signed)
Patient will get cxr today

## 2019-05-12 NOTE — Progress Notes (Signed)
SUBJECTIVE: The patient presents for follow-up of coronary artery disease and atrial fibrillation. [place] He presented to a hospital there with unstable angina. He underwent a nuclear stress test which demonstrated anterior ischemia. He then underwent cardiac catheterization which demonstrated severe stenosis of the ostium of the RCA. The stent did not appear to be fully expanded. This was successfully treated with balloon angioplasty. He underwent FFR of the LAD which demonstrated nonobstructive disease.  He and his wife stay at [facility] 3 weeks out of each month. They are active in their church there.   He leaves for [facility] tomorrow.  He has been experiencing more shortness of breath with exertion over the past 2 months.  He has also noticed bilateral leg weakness after mowing the lawn over the same period of time.  He denies anginal chest pain.  He denies palpitations, orthopnea, and paroxysmal nocturnal dyspnea.  He has not had any significant leg swelling.    Review of Systems: As per "subjective", otherwise negative.  Allergies  Allergen Reactions  . Sulfa Antibiotics Rash    Current Outpatient Medications  Medication Sig Dispense Refill  . amoxicillin (AMOXIL) 500 MG tablet Take 4 tablets (2,000 mg total) by mouth 2 (two) times daily. Take 1 hour prior to dental work 4 tablet 0  . aspirin EC 81 MG tablet Take 81 mg by mouth daily.    Marland Kitchen atenolol (TENORMIN) 50 MG tablet Take 1 tablet by mouth once daily 90 tablet 2  . atorvastatin (LIPITOR) 40 MG tablet TAKE 1 TABLET (40 MG TOTAL) BY MOUTH DAILY 90 tablet 3  . ELIQUIS 5 MG TABS tablet Take 1 tablet by mouth twice daily 180 tablet 3  . nitroGLYCERIN (NITROSTAT) 0.4 MG SL tablet Place 1 tablet (0.4 mg total) under the tongue every 5 (five) minutes x 3 doses as needed for chest pain. 30 tablet 3  . sertraline (ZOLOFT) 100 MG tablet Take 1 tablet (100 mg total) by mouth  daily. 90 tablet 1  . sildenafil (VIAGRA) 50 MG tablet TAKE 1 TABLET BY MOUTH ONCE DAILY AS NEEDED FOR ERECTILE DYSFUNCTION 10 tablet 1   No current facility-administered medications for this visit.    Past Medical History:  Diagnosis Date  . Acute meniscal tear of left knee   . Anginal pain ([place])    comes and goes; uses rest to relieve   . Anticoagulant long-term use   . Anxiety   . At risk for sleep apnea    STOP-BANG= 4       SENT TO PCP 05-04-2014  . Atrial fibrillation [facility])    new on-set Jan 2016--  with good rate control on atenolol  per cardiologist note Dr Bronson Ing 04-09-2014  . CAD, multiple vessel cardiologist--  dr Bronson Ing   3 vessel CAD //  s/p DES x1 to ostial RCA 06-17-2012  . Depression   . Dysrhythmia   . History of adenomatous polyp of colon    2009  . Hyperlipidemia   . Hypertension   . OA (osteoarthritis) of knee   . S/P drug eluting coronary stent placement    RCA x1  06-17-2012  . Shortness of breath dyspnea    with exercise     Past Surgical History:  Procedure Laterality Date  . CARDIAC CATHETERIZATION  06-15-2012   dr Angelena Form   Triple vessel CAD with severe ostial RCA stenosis, severe stenosis in the small caliber diagonal branch and small caliber  OM1, OM2 branches/  preserved LVSF  . COLONOSCOPY  last one 07-01-2010   Dr. Madolyn Frieze. mild diveticulosis of left colon, small internal hemorrhoids. next tcs in 5 years due to h/o colon polyps.  . COLONOSCOPY N/A 12/18/2015   Procedure: COLONOSCOPY;  Surgeon: Daneil Dolin, MD;  Location: [facility];  Service: Endoscopy;  Laterality: N/A;  1:30 pm  . KNEE ARTHROSCOPY Right 2005  (approx)  . KNEE ARTHROSCOPY Left 05/08/2014   Procedure: LEFT ARTHROSCOPY KNEE WIGHT DEBRIDEMENT PARTIAL LATERAL MENISCECTOMY AND CHONDROPLASTY;  Surgeon: Sydnee Cabal, MD;  Location: [place];  Service: Orthopedics;  Laterality: Left;  . LAPAROSCOPIC CHOLECYSTECTOMY  2011   AND REPAIR UMBILICAL  HERNIA  . LEFT ELBOW SURGERY  1969  . PERCUTANEOUS CORONARY STENT INTERVENTION (PCI-S) N/A 06/17/2012   Procedure: PERCUTANEOUS CORONARY STENT INTERVENTION (PCI-S);  Surgeon: Burnell Blanks, MD;  Location: [facility] CATH LAB;  Service: Cardiovascular;  Laterality: N/A;  DES x1 ostial RCA  . SHOULDER ARTHROSCOPY WITH ROTATOR CUFF REPAIR Bilateral x2 left//   x1 right (last one , left in 2001)  . TONSILLECTOMY  1970  . TOTAL HIP ARTHROPLASTY Right 05-25-2006  . TOTAL KNEE ARTHROPLASTY Left 01/03/2015   Procedure: TOTAL KNEE ARTHROPLASTY;  Surgeon: Sydnee Cabal, MD;  Location: [facility];  Service: Orthopedics;  Laterality: Left;  . TRANSTHORACIC ECHOCARDIOGRAM  02-08-2014   mild focal basal hypertrophy of the septum, ef 55-60%,  indeterminate diastolic function in setting of atrial fib./  possibly bicuspid AV, mild calcification without stenosis, trivial AR/  mild MR and PR/  moderate to severe LAE/  trivial PR/  moderate RAE  . trauma to left finger     partially amputation secondary to lawnmower accident   . VASECTOMY  1970'sj  w/  general anesthesia    Social History   Socioeconomic History  . Marital status: Married    Spouse name: Not on file  . Number of children: Not on file  . Years of education: Not on file  . Highest education level: Not on file  Occupational History  . Not on file  Tobacco Use  . Smoking status: Former Smoker    Packs/day: 0.50    Years: 8.00    Pack years: 4.00    Types: Cigarettes    Start date: 02/02/1959    Quit date: 01/27/1968    Years since quitting: 51.3  . Smokeless tobacco: Former Systems developer    Types: [place] date: 01/26/1970  Substance and Sexual Activity  . Alcohol use: No    Alcohol/week: 0.0 standard drinks    Comment: RARE  . Drug use: No  . Sexual activity: Not on file  Other Topics Concern  . Not on file  Social History Narrative   [employer], retired   Investment banker, operational of Radio broadcast assistant Strain:   .  Difficulty of Paying Living Expenses:   Food Insecurity:   . Worried About Charity fundraiser in the Last Year:   . Arboriculturist in the Last Year:   Transportation Needs:   . Film/video editor (Medical):   Marland Kitchen Lack of Transportation (Non-Medical):   Physical Activity:   . Days of Exercise per Week:   . Minutes of Exercise per Session:   Stress:   . Feeling of Stress :   Social Connections:   . Frequency of Communication with Friends and Family:   . Frequency of Social Gatherings with Friends  and Family:   . Attends Religious Services:   . Active Member of Clubs or Organizations:   . Attends Archivist Meetings:   Marland Kitchen Marital Status:   Intimate Partner Violence:   . Fear of Current or Ex-Partner:   . Emotionally Abused:   Marland Kitchen Physically Abused:   . Sexually Abused:       Vitals:   05/12/19 0949  BP: 124/72  Pulse: 70  Temp: 98.4 F (36.9 C)  SpO2: 98%  Weight: 226 lb (102.5 kg)  Height: 5\' 11"  (1.803 m)    Wt Readings from Last 3 Encounters:  05/12/19 226 lb (102.5 kg)  02/20/19 224 lb 4 oz (101.7 kg)  09/26/18 224 lb (101.6 kg)     PHYSICAL EXAM General: NAD HEENT: Normal. Neck: No JVD, no thyromegaly. Lungs: Clear to auscultation bilaterally with normal respiratory effort. CV: Regular rate and irregular rhythm, normal S1/S2, no S3, no murmur. No pretibial or periankle edema.  No carotid bruit.   Abdomen: Soft, nontender, no distention.  Neurologic: Alert and oriented.  Psych: Normal affect. Skin: Normal. Musculoskeletal: No gross deformities.      Labs: Lab Results  Component Value Date/Time   K 5.1 02/20/2019 11:07 AM   BUN 10 02/20/2019 11:07 AM   CREATININE 1.12 02/20/2019 11:07 AM   ALT 30 02/20/2019 11:07 AM   TSH 1.279 01/31/2014 12:11 PM   HGB 15.5 02/20/2019 11:07 AM     Lipids: Lab Results  Component Value Date/Time   LDLCALC 35 02/20/2019 11:07 AM   CHOL 77 02/20/2019 11:07 AM   TRIG 67 02/20/2019 11:07 AM   HDL 28  (L) 02/20/2019 11:07 AM      Coronary Angiography(02/2017): Left Main: Mild calcification without obstruction. It divides into the LAD, LCX LAD: Medium caliber vessel that supplies the apex. Diffuse moderate calcification throughout the proximal 3rd of the vessel. At the level of the 1st diagonal branch, there is a hazy plaque that appears to be nonobstructive on the order of 30% stenosis. The small caliber diagonal branch has a 99% proximal stenosis with TIMI 2 flow. The mid and distal LAD segments have minor luminal irregularities. LCX: Medium caliber vessel. The main body of the circumflex has no significant disease. A small OM 1 has a proximal focal 99% stenosis. The posterolateral branch has a proximal 99% stenosis. It is a small caliber vessel. RCA: Large caliber dominant vessel with a significant distribution. The ostial stent is patent. The portion of the stent located at the ostium does not appear to be completely expanded and there is severe stenosis. The remainder of the stent is widely patent. The mid RCA has a 30% eccentric plaque. A focal 30% distal stenosis. The posterolateral branch has an extensive distribution with no significant disease. The PDA branch is a medium-sized vessel with no significant disease.  Impression:  Severe stenosis at the ostium of the RCA. The DES does not appear to be completely expanded. The remainder of the stent is widely patent  Branch vessel obstructive disease with 99% lesions of the diagonal, OM1, posterolateral branch of the LCX. These were described on the cardiac catheterization done in 2014 in [place].  Questionable stenosis of the proximal LAD. Fairly heavily calcified area.  Plan: PCI of the ostial RCA stent FFR LAD Consider PCI of the circumflex branches The diagonal branch is too small for PCI   Coronary angiography, FFR LAD. On 03/17/17  Informed consent was obtained. Risks including stroke, death, heart attack, kidney  failure,  bruising, bleeding, vascular injury, need for emergent surgery were explained to the patient and the patient agreed to proceed. The patient was brought to the cath lab and prepped and draped in the usual sterile fashion. The left groin was anesthetized with 2% lidocaine and the left femoral artery was cannulated via the modified Seldinger technique. A 5 French sheath was placed. A JR 3.5 catheter were utilized to take angiographic images of the RCA. Then, a 5 [place] EBU 3.75 guide catheter was used for the FFR portion of the procedure. Heparin was administered for this portion of the study. At the conclusion of the procedure the arterial sheath was sutured in place and he was transferred to the holding area in stable condition There were no complications.   Findings: Aortic Pressure: 132/68  RCA: The site of intervention yesterday at the RCA ostium was widely patent. No dampening of the pressure waveform with catheter engagement. TIMI 3 flow.  LAD FFR: FFR value= 0.85, consistent with nonobstructive disease  Impression: 1. Continued wide patency of the ostial RCA POBA site within the the DES implanted 2014. 2. FFR lad consistent with nonobstructive disease   Plan: Continue medical management of branch vessel obstructive disease. He has done well with that over the past 4 years. Resume apixaban anticoagulation tonight. Continue clopidogrel 75 mg daily. No aspirin   ASSESSMENT AND PLAN:  1. Coronary artery disease/dyspnea on exertion: He has had some exertional dyspnea of unclear etiology.  I will obtain an echocardiogram to evaluate cardiac structure and function.  I will also check a BNP.  If these are all unremarkable, I would not rule out the possibility of right and left heart catheterization and coronary angiography.   He is status post ostial RCA angioplastyin February 2019within the drug-eluting stent implanted in 2014. Symptomatically stable. It is important to note that  episodic chest pains lasting seconds both at rest and with exertion are his typical anginal symptoms. Currently on ASA, atenolol, and atorvastatin.  2.Long-standing persistentatrial fibrillation:Heart rate is well controlled on atenolol 50 mg daily. Continue Eliquis for anticoagulation.  3. Essential VC:[id number] on present therapy. No changes.  4. Hyperlipidemia:Continue atorvastatin 40 mg. Lipids reviewed above and LDL is at goal.  5. Possible bicuspid aortic valve:He has had 2 months of exertional dyspnea.  I will obtain a follow-up echocardiogram. No murmur on physical exam. Mean gradient 4 mmHg by echocardiogram on 02/08/2014.   Disposition: Follow up virtual visit 6 weeks   Kate Sable, M.D., F.A.C.C.

## 2019-05-12 NOTE — Telephone Encounter (Signed)
-----   Message from Herminio Commons, MD sent at 05/12/2019 11:48 AM EDT ----- BNP is only mildly elevated. Let's go ahead and get a chest xray for SOB.

## 2019-05-23 ENCOUNTER — Ambulatory Visit (HOSPITAL_COMMUNITY)
Admission: RE | Admit: 2019-05-23 | Discharge: 2019-05-23 | Disposition: A | Discharge status: Home / Self Care | Payer: Medicare Other | Source: Ambulatory Visit | Attending: Cardiovascular Disease | Admitting: Cardiovascular Disease

## 2019-05-23 ENCOUNTER — Other Ambulatory Visit: Payer: Self-pay

## 2019-05-23 ENCOUNTER — Other Ambulatory Visit (HOSPITAL_COMMUNITY): Admission: RE | Admit: 2019-05-23 | Payer: Medicare Other | Source: Ambulatory Visit | Admitting: Cardiovascular Disease

## 2019-05-23 DIAGNOSIS — R0600 Dyspnea, unspecified: Secondary | ICD-10-CM | POA: Diagnosis not present

## 2019-05-23 DIAGNOSIS — R0609 Other forms of dyspnea: Secondary | ICD-10-CM

## 2019-05-23 NOTE — Progress Notes (Signed)
*  PRELIMINARY RESULTS* Echocardiogram 2D Echocardiogram has been performed.  Samuel Germany 05/23/2019, 12:45 PM

## 2019-05-24 ENCOUNTER — Telehealth: Payer: Self-pay

## 2019-05-24 ENCOUNTER — Telehealth: Payer: Self-pay | Admitting: *Deleted

## 2019-05-24 MED ORDER — ISOSORBIDE MONONITRATE ER 30 MG PO TB24: 30.0000 mg | ORAL_TABLET | Freq: Every day | ORAL | 3 refills | Status: AC

## 2019-05-24 NOTE — Telephone Encounter (Signed)
Results given for Echo.

## 2019-05-24 NOTE — Telephone Encounter (Signed)
Pt would like to know results of ECHO   Please call [phone]   Thanks renee

## 2019-05-24 NOTE — Telephone Encounter (Signed)
-----   Message from Herminio Commons, MD sent at 05/23/2019  4:45 PM EDT ----- Left-sided heart function is normal.  Right-sided heart function is only mildly weak.  There are no significant valve abnormalities.  Given his shortness of breath, I would like to try Imdur 30 mg daily to see if this helps improve symptoms.

## 2019-05-29 DIAGNOSIS — M9904 Segmental and somatic dysfunction of sacral region: Secondary | ICD-10-CM | POA: Diagnosis not present

## 2019-05-29 DIAGNOSIS — M9902 Segmental and somatic dysfunction of thoracic region: Secondary | ICD-10-CM | POA: Diagnosis not present

## 2019-05-30 ENCOUNTER — Other Ambulatory Visit: Payer: Self-pay | Admitting: Cardiovascular Disease

## 2019-06-02 ENCOUNTER — Other Ambulatory Visit: Payer: Self-pay | Admitting: Cardiovascular Disease

## 2019-06-15 ENCOUNTER — Encounter: Payer: Self-pay | Admitting: Cardiovascular Disease

## 2019-06-15 ENCOUNTER — Telehealth (INDEPENDENT_AMBULATORY_CARE_PROVIDER_SITE_OTHER): Payer: Medicare Other | Admitting: Cardiovascular Disease

## 2019-06-15 VITALS — BP 131/83 | HR 63 | Temp 97.2°F | Ht 71.0 in | Wt 220.0 lb

## 2019-06-15 DIAGNOSIS — R0602 Shortness of breath: Secondary | ICD-10-CM | POA: Diagnosis not present

## 2019-06-15 DIAGNOSIS — I10 Essential (primary) hypertension: Secondary | ICD-10-CM | POA: Diagnosis not present

## 2019-06-15 DIAGNOSIS — Z955 Presence of coronary angioplasty implant and graft: Secondary | ICD-10-CM

## 2019-06-15 DIAGNOSIS — I4819 Other persistent atrial fibrillation: Secondary | ICD-10-CM | POA: Diagnosis not present

## 2019-06-15 DIAGNOSIS — I25118 Atherosclerotic heart disease of native coronary artery with other forms of angina pectoris: Secondary | ICD-10-CM | POA: Diagnosis not present

## 2019-06-15 DIAGNOSIS — E785 Hyperlipidemia, unspecified: Secondary | ICD-10-CM

## 2019-06-15 DIAGNOSIS — I358 Other nonrheumatic aortic valve disorders: Secondary | ICD-10-CM

## 2019-06-15 NOTE — Progress Notes (Signed)
Virtual Visit via Telephone Note   This visit type was conducted due to national recommendations for restrictions regarding the COVID-19 Pandemic (e.g. social distancing) in an effort to limit this patient's exposure and mitigate transmission in our community.  Due to his co-morbid illnesses, this patient is at least at moderate risk for complications without adequate follow up.  This format is felt to be most appropriate for this patient at this time.  The patient did not have access to video technology/had technical difficulties with video requiring transitioning to audio format only (telephone).  All issues noted in this document were discussed and addressed.  No physical exam could be performed with this format.  Please refer to the patient's chart for his  consent to telehealth for [facility].   The patient was identified using 2 identifiers.  Date:  06/15/2019   ID:  E[facility], DOB 03-08-1940, MRN [id number]  Patient Location: Home Provider Location: Home  PCP:  Alycia Rossetti, MD  Cardiologist:  Kate Sable, MD  Electrophysiologist:  None   Evaluation Performed:  Follow-Up Visit  Chief Complaint:  SOB, CAD  History of Present Illness:    Eddie Price is a 79 y.o. male with coronary artery disease and atrial fibrillation. [place] He presented to a hospital there with unstable angina. He underwent a nuclear stress test which demonstrated anterior ischemia. He then underwent cardiac catheterization which demonstrated severe stenosis of the ostium of the RCA. The stent did not appear to be fully expanded. This was successfully treated with balloon angioplasty. He underwent FFR of the LAD which demonstrated nonobstructive disease.  He and his wife stay at [facility] 3 weeks out of each month. They are active in their church there.  At his last visit with me on 05/12/2019, he complained of increasing shortness of  breath with exertion over the prior 2 months.  He denied anginal pain.  I have ordered a series of studies.  I also started Imdur 30 mg on 05/23/19.  BNP was only mildly elevated at 224.  Chest x-ray showed no active cardiopulmonary disease with no evidence of pneumonia or pulmonary edema.  His breathing has improved with the addition of Imdur. He denies chest pain as well.  He is currently at [facility].  He and his wife will have been married 25 yrs in July.   Past Medical History:  Diagnosis Date  . Acute meniscal tear of left knee   . Anginal pain ([place])    comes and goes; uses rest to relieve   . Anticoagulant long-term use   . Anxiety   . At risk for sleep apnea    STOP-BANG= 4       SENT TO PCP 05-04-2014  . Atrial fibrillation [facility])    new on-set Jan 2016--  with good rate control on atenolol  per cardiologist note Dr Bronson Ing 04-09-2014  . CAD, multiple vessel cardiologist--  dr Bronson Ing   3 vessel CAD //  s/p DES x1 to ostial RCA 06-17-2012  . Depression   . Dysrhythmia   . History of adenomatous polyp of colon    2009  . Hyperlipidemia   . Hypertension   . OA (osteoarthritis) of knee   . S/P drug eluting coronary stent placement    RCA x1  06-17-2012  . Shortness of breath dyspnea    with exercise    Past Surgical History:  Procedure Laterality Date  . CARDIAC CATHETERIZATION  06-15-2012   dr Angelena Form   Triple vessel CAD with severe ostial RCA stenosis, severe stenosis in the small caliber diagonal branch and small caliber OM1, OM2 branches/  preserved LVSF  . COLONOSCOPY  last one 07-01-2010   Dr. Madolyn Frieze. mild diveticulosis of left colon, small internal hemorrhoids. next tcs in 5 years due to h/o colon polyps.  . COLONOSCOPY N/A 12/18/2015   Procedure: COLONOSCOPY;  Surgeon: Daneil Dolin, MD;  [facility];  Service: Endoscopy;  Laterality: N/A;  1:30 pm  . KNEE ARTHROSCOPY Right 2005  (approx)  . KNEE ARTHROSCOPY Left 05/08/2014    Procedure: LEFT ARTHROSCOPY KNEE WIGHT DEBRIDEMENT PARTIAL LATERAL MENISCECTOMY AND CHONDROPLASTY;  Surgeon: Sydnee Cabal, MD;  Location: [facility];  Service: Orthopedics;  Laterality: Left;  . LAPAROSCOPIC CHOLECYSTECTOMY  2011   AND REPAIR UMBILICAL HERNIA  . LEFT ELBOW SURGERY  1969  . PERCUTANEOUS CORONARY STENT INTERVENTION (PCI-S) N/A 06/17/2012   Procedure: PERCUTANEOUS CORONARY STENT INTERVENTION (PCI-S);  Surgeon: Burnell Blanks, MD;  Location: [facility] CATH LAB;  Service: Cardiovascular;  Laterality: N/A;  DES x1 ostial RCA  . SHOULDER ARTHROSCOPY WITH ROTATOR CUFF REPAIR Bilateral x2 left//   x1 right (last one , left in 2001)  . TONSILLECTOMY  1970  . TOTAL HIP ARTHROPLASTY Right 05-25-2006  . TOTAL KNEE ARTHROPLASTY Left 01/03/2015   Procedure: TOTAL KNEE ARTHROPLASTY;  Surgeon: Sydnee Cabal, MD;  Location: [facility];  Service: Orthopedics;  Laterality: Left;  . TRANSTHORACIC ECHOCARDIOGRAM  02-08-2014   mild focal basal hypertrophy of the septum, ef 55-60%,  indeterminate diastolic function in setting of atrial fib./  possibly bicuspid AV, mild calcification without stenosis, trivial AR/  mild MR and PR/  moderate to severe LAE/  trivial PR/  moderate RAE  . trauma to left finger     partially amputation secondary to lawnmower accident   . VASECTOMY  1970'sj  w/  general anesthesia     Current Meds  Medication Sig  . aspirin EC 81 MG tablet Take 81 mg by mouth daily.  Marland Kitchen atenolol (TENORMIN) 50 MG tablet Take 1 tablet by mouth once daily  . atorvastatin (LIPITOR) 40 MG tablet Take 1 tablet by mouth once daily  . ELIQUIS 5 MG TABS tablet Take 1 tablet by mouth twice daily  . isosorbide mononitrate (IMDUR) 30 MG 24 hr tablet Take 1 tablet (30 mg total) by mouth daily.  . nitroGLYCERIN (NITROSTAT) 0.4 MG SL tablet Place 1 tablet (0.4 mg total) under the tongue every 5 (five) minutes x 3 doses as needed for chest pain.  Marland Kitchen sertraline (ZOLOFT) 100 MG tablet Take 1  tablet (100 mg total) by mouth daily.  . sildenafil (VIAGRA) 50 MG tablet TAKE 1 TABLET BY MOUTH ONCE DAILY AS NEEDED FOR ERECTILE DYSFUNCTION     Allergies:   Sulfa antibiotics   Social History   Tobacco Use  . Smoking status: Former Smoker    Packs/day: 0.50    Years: 8.00    Pack years: 4.00    Types: Cigarettes    Start date: 02/02/1959    Quit date: 01/27/1968    Years since quitting: 51.4  . Smokeless tobacco: Former Systems developer    Types: Chew    Quit date: 01/26/1970  Substance Use Topics  . Alcohol use: No    Alcohol/week: 0.0 standard drinks    Comment: RARE  . Drug use: No     Family Hx: The patient's family history includes COPD  in his mother; Cancer in his maternal grandmother; Depression in his mother; Heart attack in his father; Heart disease in his father; Heart failure in his father and mother; Hyperlipidemia in his father; Mental illness in his mother; Stroke in his father. There is no history of Colon cancer.  ROS:   Please see the history of present illness.     All other systems reviewed and are negative.   Prior CV studies:   The following studies were reviewed today:  Echo 05/23/19:  1. Left ventricular ejection fraction, by estimation, is 55 to 60%. The  left ventricle has normal function. The left ventricle demonstrates  regional wall motion abnormalities (see scoring diagram/findings for  description). There is mild left ventricular  hypertrophy. Left ventricular diastolic parameters are indeterminate.  2. Right ventricular systolic function is mildly reduced. The right  ventricular size is normal. Tricuspid regurgitation signal is inadequate  for assessing PA pressure.  3. Left atrial size was moderately dilated.  4. Right atrial size was moderately dilated.  5. The mitral valve is grossly normal. Trivial mitral valve  regurgitation.  6. The aortic valve is tricuspid. Aortic valve regurgitation is trivial.  Mild aortic valve sclerosis is  present, with no evidence of aortic valve  stenosis.  7. The inferior vena cava is dilated in size with >50% respiratory  variability, suggesting right atrial pressure of 8 mmHg.     Coronary Angiography(02/2017): Left Main: Mild calcification without obstruction. It divides into the LAD, LCX LAD: Medium caliber vessel that supplies the apex. Diffuse moderate calcification throughout the proximal 3rd of the vessel. At the level of the 1st diagonal branch, there is a hazy plaque that appears to be nonobstructive on the order of 30% stenosis. The small caliber diagonal branch has a 99% proximal stenosis with TIMI 2 flow. The mid and distal LAD segments have minor luminal irregularities. LCX: Medium caliber vessel. The main body of the circumflex has no significant disease. A small OM 1 has a proximal focal 99% stenosis. The posterolateral branch has a proximal 99% stenosis. It is a small caliber vessel. RCA: Large caliber dominant vessel with a significant distribution. The ostial stent is patent. The portion of the stent located at the ostium does not appear to be completely expanded and there is severe stenosis. The remainder of the stent is widely patent. The mid RCA has a 30% eccentric plaque. A focal 30% distal stenosis. The posterolateral branch has an extensive distribution with no significant disease. The PDA branch is a medium-sized vessel with no significant disease.  Impression:  Severe stenosis at the ostium of the RCA. The DES does not appear to be completely expanded. The remainder of the stent is widely patent  Branch vessel obstructive disease with 99% lesions of the diagonal, OM1, posterolateral branch of the LCX. These were described on the cardiac catheterization done in 2014 in [place].  Questionable stenosis of the proximal LAD. Fairly heavily calcified area.  Plan: PCI of the ostial RCA stent FFR LAD Consider PCI of the circumflex branches The diagonal branch is too  small for PCI   Coronary angiography, FFR LAD. On 03/17/17  Informed consent was obtained. Risks including stroke, death, heart attack, kidney failure, bruising, bleeding, vascular injury, need for emergent surgery were explained to the patient and the patient agreed to proceed. The patient was brought to the cath lab and prepped and draped in the usual sterile fashion. The left groin was anesthetized with 2% lidocaine and the left  femoral artery was cannulated via the modified Seldinger technique. A 5 French sheath was placed. A JR 3.5 catheter were utilized to take angiographic images of the RCA. Then, a 5 [place] EBU 3.75 guide catheter was used for the FFR portion of the procedure. Heparin was administered for this portion of the study. At the conclusion of the procedure the arterial sheath was sutured in place and he was transferred to the holding area in stable condition There were no complications.   Findings: Aortic Pressure: 132/68  RCA: The site of intervention yesterday at the RCA ostium was widely patent. No dampening of the pressure waveform with catheter engagement. TIMI 3 flow.  LAD FFR: FFR value= 0.85, consistent with nonobstructive disease  Impression: 1. Continued wide patency of the ostial RCA POBA site within the the DES implanted 2014. 2. FFR lad consistent with nonobstructive disease   Plan: Continue medical management of branch vessel obstructive disease. He has done well with that over the past 4 years. Resume apixaban anticoagulation tonight. Continue clopidogrel 75 mg daily. No aspirin   Labs/Other Tests and Data Reviewed:    EKG:  No ECG reviewed.  Recent Labs: 02/20/2019: ALT 30; BUN 10; Creat 1.12; Hemoglobin 15.5; Platelets 222; Potassium 5.1; Sodium 140 05/12/2019: B Natriuretic Peptide 224.0   Recent Lipid Panel Lab Results  Component Value Date/Time   CHOL 77 02/20/2019 11:07 AM   TRIG 67 02/20/2019 11:07 AM   HDL 28 (L) 02/20/2019 11:07 AM    CHOLHDL 2.8 02/20/2019 11:07 AM   LDLCALC 35 02/20/2019 11:07 AM    Wt Readings from Last 3 Encounters:  06/15/19 220 lb (99.8 kg)  05/12/19 226 lb (102.5 kg)  02/20/19 224 lb 4 oz (101.7 kg)     Objective:    Vital Signs:  BP 131/83   Pulse 63   Temp (!) 97.2 F (36.2 C)   Ht 5\' 11"  (1.803 m)   Wt 220 lb (99.8 kg)   BMI 30.68 kg/m    VITAL SIGNS:  reviewed  ASSESSMENT & PLAN:    1. Coronary artery disease/dyspnea on exertion: BNP mildly elevated at 224 with normal chest x-ray.  Echocardiogram demonstrated normal LV systolic function.  I started Imdur 30 mg daily on 05/23/2019. His symptoms have improve and he currently denies any chest pain or shortness of breath. He is status post ostial RCA angioplastyin February 2019with drug-eluting stent implanted in 2014. It is important to note that episodic chest pains lasting seconds both at rest and with exertion are his typical anginal symptoms. Currently onASA, atenolol,and atorvastatin.  2.Long-standing persistentatrial fibrillation:Heart rate is well controlled on atenolol 50 mg daily. Continue Eliquis for anticoagulation.  3. Essential HTN:BP is normal. No changes.  4. Hyperlipidemia:Continue atorvastatin 40 mg. Lipids reviewed above and LDL is at goal.  5. Aortic valve sclerosis: Aortic valve is tricuspid by echocardiogram on 05/23/2019.  There was trivial regurgitation and mild sclerosis with no evidence of stenosis.     COVID-19 Education: The signs and symptoms of COVID-19 were discussed with the patient and how to seek care for testing (follow up with PCP or arrange E-visit).  The importance of social distancing was discussed today.  Time:   Today, I have spent 20 minutes with the patient with telehealth technology discussing the above problems.     Medication Adjustments/Labs and Tests Ordered: Current medicines are reviewed at length with the patient today.  Concerns regarding medicines  are outlined above.   Tests Ordered: No orders of  the defined types were placed in this encounter.   Medication Changes: No orders of the defined types were placed in this encounter.   Follow Up:  In Person in 6 month(s)  Signed, Kate Sable, MD  06/15/2019 9:30 AM    [facility]

## 2019-06-15 NOTE — Patient Instructions (Signed)
Medication Instructions:  Your physician recommends that you continue on your current medications as directed. Please refer to the Current Medication list given to you today.  *If you need a refill on your cardiac medications before your next appointment, please call your pharmacy*   Lab Work: NONE   If you have labs (blood work) drawn today and your tests are completely normal, you will receive your results only by: . MyChart Message (if you have MyChart) OR . A paper copy in the mail If you have any lab test that is abnormal or we need to change your treatment, we will call you to review the results.   Testing/Procedures: NONE   Follow-Up: At [facility], you and your health needs are our priority.  As part of our continuing mission to provide you with exceptional heart care, we have created designated Provider Care Teams.  These Care Teams include your primary Cardiologist (physician) and Advanced Practice Providers (APPs -  Physician Assistants and Nurse Practitioners) who all work together to provide you with the care you need, when you need it.  We recommend signing up for the patient portal called "MyChart".  Sign up information is provided on this After Visit Summary.  MyChart is used to connect with patients for Virtual Visits (Telemedicine).  Patients are able to view lab/test results, encounter notes, upcoming appointments, etc.  Non-urgent messages can be sent to your provider as well.   To learn more about what you can do with MyChart, go to [email or web address].    Your next appointment:   6 month(s)  The format for your next appointment:   In Person  Provider:   Suresh Koneswaran, MD   Other Instructions Thank you for choosing [facility]!    

## 2019-06-20 ENCOUNTER — Telehealth: Payer: Medicare Other | Admitting: Cardiovascular Disease

## 2019-07-24 ENCOUNTER — Telehealth: Payer: Self-pay | Admitting: Family Medicine

## 2019-07-24 NOTE — Progress Notes (Signed)
  Chronic Care Management   Note  07/24/2019 Name: [facility] MRN: [id number] DOB: 03-Dec-1940  [name] is a 79 y.o. year old male who is a primary care patient of [place], Modena Nunnery, MD. I reached out to Anthonette Legato by phone today in response to a referral sent by [name]'s PCP, Buelah Manis, Modena Nunnery, MD.   [name] was given information about Chronic Care Management services today including:  1. CCM service includes personalized support from designated clinical staff supervised by his physician, including individualized plan of care and coordination with other care providers 2. 24/7 contact phone numbers for assistance for urgent and routine care needs. 3. Service will only be billed when office clinical staff spend 20 minutes or more in a month to coordinate care. 4. Only one practitioner may furnish and bill the service in a calendar month. 5. The patient may stop CCM services at any time (effective at the end of the month) by phone call to the office staff.   Patient agreed to services and verbal consent obtained.   Follow up plan:   [place]

## 2019-08-08 ENCOUNTER — Other Ambulatory Visit: Payer: Self-pay | Admitting: Family Medicine

## 2019-08-21 ENCOUNTER — Ambulatory Visit (INDEPENDENT_AMBULATORY_CARE_PROVIDER_SITE_OTHER): Payer: Medicare Other | Admitting: Family Medicine

## 2019-08-21 ENCOUNTER — Other Ambulatory Visit: Payer: Self-pay

## 2019-08-21 ENCOUNTER — Encounter: Payer: Self-pay | Admitting: Family Medicine

## 2019-08-21 VITALS — BP 124/70 | HR 84 | Temp 98.2°F | Resp 16 | Ht 71.0 in | Wt 224.0 lb

## 2019-08-21 DIAGNOSIS — I480 Paroxysmal atrial fibrillation: Secondary | ICD-10-CM

## 2019-08-21 DIAGNOSIS — I25118 Atherosclerotic heart disease of native coronary artery with other forms of angina pectoris: Secondary | ICD-10-CM

## 2019-08-21 DIAGNOSIS — I10 Essential (primary) hypertension: Secondary | ICD-10-CM | POA: Diagnosis not present

## 2019-08-21 DIAGNOSIS — F418 Other specified anxiety disorders: Secondary | ICD-10-CM

## 2019-08-21 DIAGNOSIS — Z0001 Encounter for general adult medical examination with abnormal findings: Secondary | ICD-10-CM

## 2019-08-21 DIAGNOSIS — H9193 Unspecified hearing loss, bilateral: Secondary | ICD-10-CM

## 2019-08-21 DIAGNOSIS — E6609 Other obesity due to excess calories: Secondary | ICD-10-CM

## 2019-08-21 DIAGNOSIS — Z0000 Encounter for general adult medical examination without abnormal findings: Secondary | ICD-10-CM

## 2019-08-21 DIAGNOSIS — Z6831 Body mass index (BMI) 31.0-31.9, adult: Secondary | ICD-10-CM

## 2019-08-21 LAB — CBC WITH DIFFERENTIAL/PLATELET
Absolute Monocytes: 706 cells/uL (ref 200–950)
Basophils Absolute: 53 cells/uL (ref 0–200)
Basophils Relative: 0.8 %
Eosinophils Absolute: 191 cells/uL (ref 15–500)
Eosinophils Relative: 2.9 %
HCT: 45.9 % (ref 38.5–50.0)
Hemoglobin: 15.4 g/dL (ref 13.2–17.1)
Lymphs Abs: 2119 cells/uL (ref 850–3900)
MCH: 33 pg (ref 27.0–33.0)
MCHC: 33.6 g/dL (ref 32.0–36.0)
MCV: 98.3 fL (ref 80.0–100.0)
MPV: 10.1 fL (ref 7.5–12.5)
Monocytes Relative: 10.7 %
Neutro Abs: 3531 cells/uL (ref 1500–7800)
Neutrophils Relative %: 53.5 %
Platelets: 214 10*3/uL (ref 140–400)
RBC: 4.67 10*6/uL (ref 4.20–5.80)
RDW: 12.7 % (ref 11.0–15.0)
Total Lymphocyte: 32.1 %
WBC: 6.6 10*3/uL (ref 3.8–10.8)

## 2019-08-21 LAB — COMPREHENSIVE METABOLIC PANEL
AG Ratio: 1.6 (calc) (ref 1.0–2.5)
ALT: 24 U/L (ref 9–46)
AST: 23 U/L (ref 10–35)
Albumin: 4.4 g/dL (ref 3.6–5.1)
Alkaline phosphatase (APISO): 54 U/L (ref 35–144)
BUN/Creatinine Ratio: 12 (calc) (ref 6–22)
BUN: 15 mg/dL (ref 7–25)
CO2: 25 mmol/L (ref 20–32)
Calcium: 9.4 mg/dL (ref 8.6–10.3)
Chloride: 105 mmol/L (ref 98–110)
Creat: 1.21 mg/dL — ABNORMAL HIGH (ref 0.70–1.18)
Globulin: 2.8 g/dL (calc) (ref 1.9–3.7)
Glucose, Bld: 99 mg/dL (ref 65–99)
Potassium: 5 mmol/L (ref 3.5–5.3)
Sodium: 140 mmol/L (ref 135–146)
Total Bilirubin: 1.2 mg/dL (ref 0.2–1.2)
Total Protein: 7.2 g/dL (ref 6.1–8.1)

## 2019-08-21 LAB — LIPID PANEL
Cholesterol: 78 mg/dL (ref <200)
HDL: 30 mg/dL — ABNORMAL LOW (ref >=40)
LDL Cholesterol (Calc): 33 mg/dL (calc)
Non-HDL Cholesterol (Calc): 48 mg/dL (calc) (ref <130)
Total CHOL/HDL Ratio: 2.6 (calc) (ref <5.0)
Triglycerides: 73 mg/dL (ref <150)

## 2019-08-21 NOTE — Progress Notes (Signed)
Subjective:   Patient presents for Medicare Annual/Subsequent preventive examination.   Only concern is decreased hearing in his left ear, noticed around the same time as his COVID-19 vaccine  no discharge from ear, no ringing in ear  CAD/A fib- overall symptoms stable, on occasion has a slight chest discomfort that last a few seconds, does not require NTG as resolves quickly, taking meds as prescribed, no abnormal bleeding with eliquis    Review Past Medical/Family/Social: per emr    Risk Factors  Current exercise habits: walks Dietary issues discussed: Yes  Cardiac risk factors:Known CAD ,HTN   Depression Screen  (Note: if answer to either of the following is "Yes", a more complete depression screening is indicated)  Over the past two weeks, have you felt down, depressed or hopeless? No Over the past two weeks, have you felt little interest or pleasure in doing things? No Have you lost interest or pleasure in daily life? No Do you often feel hopeless? No Do you cry easily over simple problems? No   Activities of Daily Living  In your present state of health, do you have any difficulty performing the following activities?:  Driving? No  Managing money? No  Feeding yourself? No  Getting from bed to chair? No  Climbing a flight of stairs? No  Preparing food and eating?: No  Bathing or showering? No  Getting dressed: No  Getting to the toilet? No  Using the toilet:No  Moving around from place to place: No  In the past year have you fallen or had a near fall?:No  Are you sexually active? No  Do you have more than one partner? No   Hearing Difficulties: Yes- LEFT EAR Do you often ask people to speak up or repeat themselves? Yes  Do you experience ringing or noises in your ears? No Do you have difficulty understanding soft or whispered voices? Yes Do you feel that you have a problem with memory? No Do you often misplace items? No  Do you feel safe at home?  Yes  Cognitive Testing  Alert? Yes Normal Appearance?Yes  Oriented to person? Yes Place? Yes  Time? Yes  Recall of three objects? Yes  Can perform simple calculations? Yes  Displays appropriate judgment?Yes  Can read the correct time from a watch face?Yes   List the Names of Other Physician/Practitioners you currently use: Cardiology , orthopedics [facility] - yearly    Screening Tests / DateUTD ColonoscopyUTD Zostavax/shingrix UTD  Pneumonia- UTD Influenza Vaccine UTD COVID-19 UTD Tetanus/tdapUTD   ROS: GEN- denies fatigue, fever, weight loss,weakness, recent illness HEENT- denies eye drainage, change in vision, nasal discharge, CVS- denies chest pain, palpitations RESP- denies SOB, cough, wheeze ABD- denies N/V, change in stools, abd pain GU- denies dysuria, hematuria, dribbling, incontinence MSK- denies joint pain, muscle aches, injury Neuro- denies headache, dizziness, syncope, seizure activity  Physical: VITALS REVIEWED  GEN- NAD, alert and oriented x3 HEENT- PERRL, EOMI, non injected sclera, pink conjunctiva, MMM, oropharynx clear, TM clear bilat   Neck- Supple, no thryomegaly, no bruit CVS- RRR, no murmur RESP-CTAB ABD-NABS,soft,NT,ND  EXT- No edema Pulses- Radial, DP- 2+   Assessment:    Annual wellness medicare exam   Plan:    During the course of the visit the patient was educated and counseled about appropriate screening and preventive services including:     Audit C/Fall/MDD - NEGATIVE   Prevention UTD,   CAD/AFIB/HTN- BP controlled , sinus rhythem, rate controlled, continue eliquis, atenolol, f/u cardiology in  in Fall  Hearing loss L > R will send to audiology for complete hearing examination, no sign of infection or cerumen impaction   Fasting labs done today, no change in meds  Pt has advanced directives   MDD- continue zoloft to control mood- chronic med no changes , no SE with  meds  F/U 6 months         Diet review for nutrition referral? Yes ____ Not Indicated __x__  Patient Instructions (the written plan) was given to the patient.  Medicare Attestation  I have personally reviewed:  The patient's medical and social history  Their use of alcohol, tobacco or illicit drugs  Their current medications and supplements  The patient's functional ability including ADLs,fall risks, home safety risks, cognitive, and hearing and visual impairment  Diet and physical activities  Evidence for depression or mood disorders  The patient's weight, height, BMI, and visual acuity have been recorded in the chart. I have made referrals, counseling, and provided education to the patient based on review of the above and I have provided the patient with a written personalized care plan for preventive services.

## 2019-08-21 NOTE — Patient Instructions (Signed)
F/U 6 months

## 2019-09-04 DIAGNOSIS — N3000 Acute cystitis without hematuria: Secondary | ICD-10-CM | POA: Diagnosis not present

## 2019-09-18 NOTE — Chronic Care Management (AMB) (Signed)
Chronic Care Management Pharmacy  Name: Eddie Price  MRN: [id number] DOB: 10-Mar-1940   Chief Complaint/ HPI  Eddie Price,  79 y.o. , male presents for their Initial CCM visit with the clinical pharmacist via telephone.  PCP : Alycia Rossetti, MD  Their chronic conditions include: HTN, Afib, HLD, anxiety/depression.  Office Visits: 08/21/2019 [facility]) - annual visit  Hearing loss in L ear greater than R  Afib controlled  No med changes at this visit  09/04/2019 (Urgent Care) - patient has UTI  Given rx for Cipro x 5 days  Consult Visit: 05/12/2019 [facility], [place])  Afib controlled  ECG recommended as he is experiencing dyspnea  Medications: Outpatient Encounter Medications as of 09/21/2019  Medication Sig  . aspirin EC 81 MG tablet Take 81 mg by mouth daily.  Marland Kitchen atenolol (TENORMIN) 50 MG tablet Take 1 tablet by mouth once daily  . atorvastatin (LIPITOR) 40 MG tablet Take 1 tablet by mouth once daily  . ELIQUIS 5 MG TABS tablet Take 1 tablet by mouth twice daily  . nitroGLYCERIN (NITROSTAT) 0.4 MG SL tablet Place 1 tablet (0.4 mg total) under the tongue every 5 (five) minutes x 3 doses as needed for chest pain.  Marland Kitchen sertraline (ZOLOFT) 100 MG tablet Take 1 tablet by mouth once daily  . sildenafil (VIAGRA) 50 MG tablet TAKE 1 TABLET BY MOUTH ONCE DAILY AS NEEDED FOR ERECTILE DYSFUNCTION  . isosorbide mononitrate (IMDUR) 30 MG 24 hr tablet Take 1 tablet (30 mg total) by mouth daily.   No facility-administered encounter medications on file as of 09/21/2019.     Current Diagnosis/Assessment:   Emergency planning/management officer Strain: Low Risk   . Difficulty of Paying Living Expenses: Not hard at all    Goals Addressed            This Visit's Progress   . Pharmacy Care Plan:       CARE PLAN ENTRY (see longitudinal plan of care for additional care plan information)  Current Barriers:  . Chronic Disease Management support, education, and care coordination needs  related to Hypertension, Hyperlipidemia, and Atrial Fibrillation   Hypertension BP Readings from Last 3 Encounters:  08/21/19 124/70  06/15/19 131/83  05/12/19 124/72   . Pharmacist Clinical Goal(s): o Over the next 180 days, patient will work with PharmD and providers to maintain BP goal <130/80 . Current regimen:  o Atenolol 64m daily o Imdur 330mdaily . Interventions: o Discussed home blood pressure monitoring o Reviewed most recent in office BP . Patient self care activities - Over the next 180 days, patient will: o Check BP periodically, document, and provide at future appointments o Ensure daily salt intake < 2300 mg/day  Hyperlipidemia Lab Results  Component Value Date/Time   LDLCALC 33 08/21/2019 11:32 AM   . Pharmacist Clinical Goal(s): o Over the next 180 days, patient will work with PharmD and providers to maintain LDL goal < 70 . Current regimen:  o Atorvastatin 4014m Interventions: o Reviewed most recent lipid panel o Discussed current diet . Patient self care activities - Over the next 180 days, patient will: o Continue to focus on medication adherence by pill box o Continue to focus on diet high in fresh vegetables  Afib . Pharmacist Clinical Goal(s) o Over the next 180 days, patient will work with PharmD and providers to optimize medication and minimize symptoms related to Afib. . Current regimen:   Eliquis 5mg70mtenolol 50mg34mly  Imdur 30mg58m  daily . Interventions: o Counseled on signs/symptoms of abnormal bruising or bleeding o Discussed affordability of brand name Eliquis . Patient self care activities - Over the next 180 days, patient will: o Continue to focus on medication adherence by pill box o Reach out to providers should Eliquis copay become a burden  Initial goal documentation        AFIB   Patient is currently rate controlled. HR WNL per patient  Patient has failed these meds in past: none noted  Patient is currently  controlled on the following medications: Eliquis 59m  Atenolol 538mdaily Imdur 3068maily    Patient adherent with all medications.  Denies bleeding, bruising.  Does report high copay on Eliquis as he is donut hole, however he fails to qualify for PAP due to income and assets.  Counseled on looking for insurance company next cycle that covers the medication better.  Plan  Continue current medications   Hypertension    Office blood pressures are  BP Readings from Last 3 Encounters:  08/21/19 124/70  06/15/19 131/83  05/12/19 124/72    Patient has failed these meds in the past: none noted  Patient checks BP at home infrequently Patient is currently controlled on the following medications:   Atenolol 36m32mily  Imdur 30mg58mly Patient home BP readings are ranging: none available  Patient does have access to home cuff.  Denies dizziness, headaches.  Very adherent with his medication per patient.  Eats diet high in fresh vegetables and limits fried foods, etc.  Plan  Continue current medications   Hyperlipidemia   LDL goal < 70  Lipid Panel     Component Value Date/Time   CHOL 78 08/21/2019 1132   TRIG 73 08/21/2019 1132   HDL 30 (L) 08/21/2019 1132   LDLCALC 33 08/21/2019 1132    Hepatic Function Latest Ref Rng & Units 08/21/2019 02/20/2019 08/19/2018  Total Protein 6.1 - 8.1 g/dL 7.2 7.2 6.7  Albumin 3.6 - 5.1 g/dL - - -  AST 10 - 35 U/L 23 25 24   ALT 9 - 46 U/L 24 30 32  Alk Phosphatase 40 - 115 U/L - - -  Total Bilirubin 0.2 - 1.2 mg/dL 1.2 0.8 0.9     The ASCVD Risk score (Goff [place] al., 2013) failed to calculate for the following reasons:   The valid total cholesterol range is 130 to 320 mg/dL   Patient has failed these meds in past: none noted Patient is currently controlled on the following medications:  . Atorvastatin 40mg 67mies myalgias, takes at appropriate time.  LDL very well controlled at last check up.    Plan  Continue current  medications  Depression/Anxiety   Patient has failed these meds in past: none noted Patient is currently controlled on the following medications:  . Sertraline 100mg  60ment takes daily, no adverse effects.  Reports mood is controlled lately.  [place] and patient is in a good state of mental health.  Plan  Continue current medications Vaccines   Reviewed and discussed patient's vaccination history.    Immunization History  Administered Date(s) Administered  . Hepatitis B 10/10/1997, 11/09/1997, 06/27/1998  . Influenza, High Dose Seasonal PF 10/19/2015, 10/13/2016, 10/15/2017, 10/27/2018  . Influenza,inj,Quad PF,6+ Mos 10/13/2016, 10/27/2018  . Influenza-Unspecified 11/02/2008, 09/19/2012, 09/10/2013, 10/19/2014  . Meningococcal Conjugate 06/27/1998  . Moderna SARS-COVID-2 Vaccination 02/22/2019, 03/22/2019  . Pneumococcal Conjugate-13 01/23/2013  .  Td 04/17/1997, 01/05/2014  . Tdap 01/05/2014  . Zoster 01/05/2014  . Zoster Recombinat (Shingrix) 08/13/2017, 01/07/2018    Plan  Patient up to date on all vaccines, will be due for COVID-19 booster in October. Medication Management   . Miscellaneous medications:  o Sildenafil 30m prn . OTC's:  o ASA 863m. Patient currently uses [facility] [place]  . Patient reports using pill box method to organize medications and promote adherence. . Patient denies missed doses of medication.   ChBeverly MilchPharmD Clinical Pharmacist [phone]

## 2019-09-20 ENCOUNTER — Other Ambulatory Visit: Payer: Self-pay

## 2019-09-20 ENCOUNTER — Telehealth: Payer: Self-pay | Admitting: Pharmacist

## 2019-09-20 ENCOUNTER — Ambulatory Visit: Payer: Medicare Other | Attending: Family Medicine | Admitting: Audiology

## 2019-09-20 DIAGNOSIS — H903 Sensorineural hearing loss, bilateral: Secondary | ICD-10-CM | POA: Insufficient documentation

## 2019-09-20 NOTE — Patient Instructions (Signed)
Outpatient Audiology and [place], [place]  [address] [phone] __________________________________________________________________________________________________________  Suggestions for Effective Communication   A. Pay Attention to Your Environment 1. Try to keep background noise to a minimum. 2. Position yourself within six to ten feet and where you can see the speaker's face. If you have a better-hearing ear, try to keep it towards the speaker. 3. Let the speaker know you have a hearing loss and that you will understand the conversation better if he or she is facing you. 4. Ask the person to speak clearly and distinctly. Overly loud or exaggerated speech is not easier to understand. 5. Remind your family and friends to be sure that they have your attention before speaking to you. Ask them to be in the same room with you and not to shout across a long distance.   B. Be Alert and Develop Good Listening Habits 1. Use your hearing aid. Sometimes you may not notice how much it helps, but others will. 2. Follow along with the speaker and as you become familiar with the rhythm of his or her speech, you will pick up key words that will allow you to make good guesses about parts of the conversation you have missed. Use the following suggestions if you are still not understanding: a) Restate what you heard to eliminate misunderstandings. b) When you did not hear enough to use a key word and the person has repeated the message, ask him or her to say the same thing again using different words. c) If you don't understand a word or a name, even after repetition, ask the speaker to spell it or write it down. d) In group situations, position yourself so that you can see everyone and are not too far away. 3. Be Realistic! You might not always be sure of what was said; but, by using some of these suggestions you should do better. No one, even those with  normal hearing, hears everything all the time.   C. Tips for Difficult Listening Situations 1. Radio or television: These are difficult because you can't always see the faces of the people talking and there may be music accompanying the action. Reduce competing noises as much as possible and consider using assistive devices or closed captioning. 2. Telephone: Most hearing aids have a feature that helps with the telephone or you can purchase a telephone amplifier. Some people use both. Your audiologist will help you decide which arrangement is best for you. 3. Public places (such as house of worship, theaters, etc.): For single-speaker situations, try to sit about six rows from the front and in line with the speaker's face. At entertainment venues, try to sit close enough to the screen or stage to optimize visual cues. Avoid sitting near noisy children and try to avoid sitting under a balcony. Assistive listening devices should be available.   Outpatient Audiology and [place], [place]  [address] [phone] __________________________________________________________________________________________________________  Tips for Talking to Hard of Hearing Persons   1. Face the hard of hearing person directly.  2. Lighting should be directed on the speaker's face.  3. Avoid talking from another room.  4. Be aware that anyone will have more difficulty concentrating when fatigued or ill.  5. Speak naturally. It is more important to speak more slowly rather than more loudly.  6. Keep your hands away from your mouth while talking.  7. If you are eating, chewing, smoking, smiling, while talking, your speech  will be more difficult to understand.  8. If a person has difficulty understanding some particular phrase or word, try to find a different way of saying the same thing; rephrase, rather than repeat the original words.  9. Avoid using sentences  that go on too long. Slow down, and wait to make sure that you have been understood before continuing.  10. If you are giving specific information, such as time or place, ask the hard of hearing person to repeat what you said.  11. Avoid sudden changes of topic.  12. Don't drop your voice at the end of sentences.     Outpatient Audiology and [place], [place] [address]  [place] [facility] [place] [phone] [address] [place], [place] [address] Appointments: [phone] 0.3 mile away from [facility] Audiology and [place] [facility] [place] at [facility] and [employer] [address] [place], [place] [address] Appointments: [phone] 4 miles away from [facility]  [facility] [address] [place], [place] [address] Appointments: [phone] 7 miles away from [facility] Outpatient Audiology and [place] [facility] [facility] [address] [place], [place] [address] Appointments: [phone] 11 miles away from [facility] Outpatient Audiology and [place], [facility] [place]. [place], [place] [address] Appointments: [phone] 34 miles away from [facility] Audiology and [place] [address] [place],  [address] Appointments: [phone] 22 miles away from [place] [facility]

## 2019-09-20 NOTE — Procedures (Signed)
°  Outpatient Audiology and [place], [place]  [address] [phone]  AUDIOLOGICAL  EVALUATION  NAME: [name]     DOB:   [birth year]      MRN: [id number]                                                                                     DATE: 09/20/2019     REFERENT: Alycia Rossetti, MD STATUS: Outpatient DIAGNOSIS: Sensorineural Hearing loss, bilateral    History: Eddie Price was seen for an audiological evaluation due to decreased hearing in the left ear occurring for 3-4 months. He reports left aural fullness occurring for 3-4 months. He denies tinnitus and otalgia. He reports intermittent episodes of dizziness which he describes as "off-balance."  Evaluation:   Otoscopy showed a clear view of the tympanic membranes, bilaterally  Tympanometry results were consistent with normal middle ear pressure and normal tympanic membrane mobility in the left ear and normal middle ear pressure and  Hyper-compliant tympanic membrane mobility in the right ear.   Audiometric testing was completed using Conventional Audiometry techniques with insert earphones and TDH headphones. Test results are consistent with normal hearing sloping to a moderately-severe sensorineural hearing loss in the right ear and normal hearing sloping to a severe sensorineural hearing loss in the left ear. Asymmetric hearing loss noted, worse in the left ear. A Speech Recognition Thresholds were obtained at 25 dB HL in the right ear and at 35 dB HL in the left ear. Word Recognition Testing was completed at 70 dB HL in the right ear and Koden scored 92% and completed at 80 dB HL in the left ear and he scored 82%.  Results:  Today's test results are consistent with normal hearing sloping to a moderately-severe sensorineural hearing loss in the right ear and normal hearing sloping to a severe sensorineural hearing loss in the left ear. Asymmetric hearing loss noted from 1500-8000 Hz, worse  in the left ear. Asymmetric word recognition noted, worse in the left ear.  Nissan will have communication difficulty in many listening environments and benefit from the use of good communication strategies and amplification. The test results and recommendations were reviewed with Francee Piccolo. Kwali was given handouts on good communication strategies and a list of hearing aid dispensers in the area.   Recommendations: 1. Referral to an Ear, Nose, and Throat Physician due to asymmetric, sensorineural hearing loss, worse in the left ear.  2. Communication Needs Assessment with an Audiologist if patient is motivated to discuss amplification options.     Bari Mantis Audiologist, Au.D., CCC-A 09/20/2019  3:00 PM  Cc: Alycia Rossetti, MD

## 2019-09-20 NOTE — Chronic Care Management (AMB) (Signed)
    Chronic Care Management Pharmacy Assistant   Name: Eddie Price  MRN: [id number] DOB: 06-14-1940  Reason for Encounter: Initial Questions for Initial Pharmacist visit.  Patient Questions:  Have you seen any other providers since your last visit? Yes- Urgent Care visit on 09/04/2019([facility] [place]), Bari Mantis, (Audiology) 09/20/2019  Patient was unavailable to talk for initial questions, patient states he would call me back. Informed if unable to call back today it was ok, and to please have medications, supplements and any blood pressure logs near during telephone appointment.   PCP : Alycia Rossetti, MD  Allergies:   Allergies  Allergen Reactions  . Sulfa Antibiotics Rash    Medications: Outpatient Encounter Medications as of 09/20/2019  Medication Sig  . aspirin EC 81 MG tablet Take 81 mg by mouth daily.  Marland Kitchen atenolol (TENORMIN) 50 MG tablet Take 1 tablet by mouth once daily  . atorvastatin (LIPITOR) 40 MG tablet Take 1 tablet by mouth once daily  . ELIQUIS 5 MG TABS tablet Take 1 tablet by mouth twice daily  . isosorbide mononitrate (IMDUR) 30 MG 24 hr tablet Take 1 tablet (30 mg total) by mouth daily.  . nitroGLYCERIN (NITROSTAT) 0.4 MG SL tablet Place 1 tablet (0.4 mg total) under the tongue every 5 (five) minutes x 3 doses as needed for chest pain.  Marland Kitchen sertraline (ZOLOFT) 100 MG tablet Take 1 tablet by mouth once daily  . sildenafil (VIAGRA) 50 MG tablet TAKE 1 TABLET BY MOUTH ONCE DAILY AS NEEDED FOR ERECTILE DYSFUNCTION   No facility-administered encounter medications on file as of 09/20/2019.    Current Diagnosis: Patient Active Problem List   Diagnosis Date Noted  . Long term current use of anticoagulant therapy 12/09/2015  . History of colonic polyps 12/09/2015  . S/P knee replacement 01/03/2015  . S/P left knee arthroscopy 05/08/2014  . Atrial fibrillation ([place]) 01/31/2014  . Olecranon bursitis 08/29/2013   . OA (osteoarthritis) of knee 08/29/2013  . Depression with anxiety 10/22/2012  . Obesity 10/22/2012  . Hyperlipidemia   . Coronary atherosclerosis of native coronary artery 06/16/2012  . Unstable angina ([place]) 06/16/2012  . Esophageal reflux 06/13/2012  . Essential hypertension, benign 06/13/2012  . Impotence of organic origin 06/13/2012  . Degeneration of cervical intervertebral disc 06/13/2012     Follow-Up:  Pharmacist Review - Beverly Milch, CPP notified.   Pattricia Boss, [place] Pharmacist Assistant [phone]

## 2019-09-21 ENCOUNTER — Ambulatory Visit: Payer: Medicare Other | Admitting: Pharmacist

## 2019-09-21 ENCOUNTER — Other Ambulatory Visit: Payer: Self-pay

## 2019-09-21 DIAGNOSIS — I480 Paroxysmal atrial fibrillation: Secondary | ICD-10-CM

## 2019-09-21 DIAGNOSIS — E7800 Pure hypercholesterolemia, unspecified: Secondary | ICD-10-CM

## 2019-09-21 DIAGNOSIS — I10 Essential (primary) hypertension: Secondary | ICD-10-CM

## 2019-09-21 NOTE — Telephone Encounter (Signed)
Pt rx refill has expired

## 2019-09-21 NOTE — Patient Instructions (Addendum)
Visit Information Thank you for meeting with me today!  I look forward to working with you to help you meet all of your healthcare goals and answer any questions you may have.  Feel free to contact me anytime!  Goals Addressed            This Visit's Progress   . Pharmacy Care Plan:       CARE PLAN ENTRY (see longitudinal plan of care for additional care plan information)  Current Barriers:  . Chronic Disease Management support, education, and care coordination needs related to Hypertension, Hyperlipidemia, and Atrial Fibrillation   Hypertension BP Readings from Last 3 Encounters:  08/21/19 124/70  06/15/19 131/83  05/12/19 124/72   . Pharmacist Clinical Goal(s): o Over the next 180 days, patient will work with PharmD and providers to maintain BP goal <130/80 . Current regimen:  o Atenolol 50mg  daily o Imdur 30mg  daily . Interventions: o Discussed home blood pressure monitoring o Reviewed most recent in office BP . Patient self care activities - Over the next 180 days, patient will: o Check BP periodically, document, and provide at future appointments o Ensure daily salt intake < 2300 mg/day  Hyperlipidemia Lab Results  Component Value Date/Time   LDLCALC 33 08/21/2019 11:32 AM   . Pharmacist Clinical Goal(s): o Over the next 180 days, patient will work with PharmD and providers to maintain LDL goal < 70 . Current regimen:  o Atorvastatin 40mg  . Interventions: o Reviewed most recent lipid panel o Discussed current diet . Patient self care activities - Over the next 180 days, patient will: o Continue to focus on medication adherence by pill box o Continue to focus on diet high in fresh vegetables  Afib . Pharmacist Clinical Goal(s) o Over the next 180 days, patient will work with PharmD and providers to optimize medication and minimize symptoms related to Afib. . Current regimen:   Eliquis 5mg   Atenolol 50mg  daily  Imdur 30mg   daily . Interventions: o Counseled on signs/symptoms of abnormal bruising or bleeding o Discussed affordability of brand name Eliquis . Patient self care activities - Over the next 180 days, patient will: o Continue to focus on medication adherence by pill box o Reach out to providers should Eliquis copay become a burden  Initial goal documentation        Eddie Price was given information about Chronic Care Management services today including:  1. CCM service includes personalized support from designated clinical staff supervised by his physician, including individualized plan of care and coordination with other care providers 2. 24/7 contact phone numbers for assistance for urgent and routine care needs. 3. Standard insurance, coinsurance, copays and deductibles apply for chronic care management only during months in which we provide at least 20 minutes of these services. Most insurances cover these services at 100%, however patients may be responsible for any copay, coinsurance and/or deductible if applicable. This service may help you avoid the need for more expensive face-to-face services. 4. Only one practitioner may furnish and bill the service in a calendar month. 5. The patient may stop CCM services at any time (effective at the end of the month) by phone call to the office staff.  Patient agreed to services and verbal consent obtained.   The patient verbalized understanding of instructions provided today and agreed to receive a mailed copy of patient instruction and/or educational materials. Telephone follow up appointment with pharmacy team member scheduled for: 6 months  Beverly Milch, PharmD Clinical Pharmacist  [place] [phone]   Atrial Fibrillation  Atrial fibrillation is a type of heartbeat that is irregular or fast. If you have this condition, your heart beats without any order. This makes it hard for your heart to pump blood in a normal  way. Atrial fibrillation may come and go, or it may become a long-lasting problem. If this condition is not treated, it can put you at higher risk for stroke, heart failure, and other heart problems. What are the causes? This condition may be caused by diseases that damage the heart. They include:  High blood pressure.  Heart failure.  Heart valve disease.  Heart surgery. Other causes include:  Diabetes.  Thyroid disease.  Being overweight.  Kidney disease. Sometimes the cause is not known. What increases the risk? You are more likely to develop this condition if:  You are older.  You smoke.  You exercise often and very hard.  You have a family history of this condition.  You are a man.  You use drugs.  You drink a lot of alcohol.  You have lung conditions, such as emphysema, pneumonia, or COPD.  You have sleep apnea. What are the signs or symptoms? Common symptoms of this condition include:  A feeling that your heart is beating very fast.  Chest pain or discomfort.  Feeling short of breath.  Suddenly feeling light-headed or weak.  Getting tired easily during activity.  Fainting.  Sweating. In some cases, there are no symptoms. How is this treated? Treatment for this condition depends on underlying conditions and how you feel when you have atrial fibrillation. They include:  Medicines to: ? Prevent blood clots. ? Treat heart rate or heart rhythm problems.  Using devices, such as a pacemaker, to correct heart rhythm problems.  Doing surgery to remove the part of the heart that sends bad signals.  Closing an area where clots can form in the heart (left atrial appendage). In some cases, your doctor will treat other underlying conditions. Follow these instructions at home: Medicines  Take over-the-counter and prescription medicines only as told by your doctor.  Do not take any new medicines without first talking to your doctor.  If you are  taking blood thinners: ? Talk with your doctor before you take any medicines that have aspirin or NSAIDs, such as ibuprofen, in them. ? Take your medicine exactly as told by your doctor. Take it at the same time each day. ? Avoid activities that could hurt or bruise you. Follow instructions about how to prevent falls. ? Wear a bracelet that says you are taking blood thinners. Or, carry a card that lists what medicines you take. Lifestyle      Do not use any products that have nicotine or tobacco in them. These include cigarettes, e-cigarettes, and chewing tobacco. If you need help quitting, ask your doctor.  Eat heart-healthy foods. Talk with your doctor about the right eating plan for you.  Exercise regularly as told by your doctor.  Do not drink alcohol.  Lose weight if you are overweight.  Do not use drugs, including cannabis. General instructions  If you have a condition that causes breathing to stop for a short period of time (apnea), treat it as told by your doctor.  Keep a healthy weight. Do not use diet pills unless your doctor says they are safe for you. Diet pills may make heart problems worse.  Keep all follow-up visits as told by your doctor. This is important.  Contact a doctor if:  You notice a change in the speed, rhythm, or strength of your heartbeat.  You are taking a blood-thinning medicine and you get more bruising.  You get tired more easily when you move or exercise.  You have a sudden change in weight. Get help right away if:   You have pain in your chest or your belly (abdomen).  You have trouble breathing.  You have side effects of blood thinners, such as blood in your vomit, poop (stool), or pee (urine), or bleeding that cannot stop.  You have any signs of a stroke. "BE FAST" is an easy way to remember the main warning signs: ? B - Balance. Signs are dizziness, sudden trouble walking, or loss of balance. ? E - Eyes. Signs are trouble seeing or a  change in how you see. ? F - Face. Signs are sudden weakness or loss of feeling in the face, or the face or eyelid drooping on one side. ? A - Arms. Signs are weakness or loss of feeling in an arm. This happens suddenly and usually on one side of the body. ? S - Speech. Signs are sudden trouble speaking, slurred speech, or trouble understanding what people say. ? T - Time. Time to call emergency services. Write down what time symptoms started.  You have other signs of a stroke, such as: ? A sudden, very bad headache with no known cause. ? Feeling like you may vomit (nausea). ? Vomiting. ? A seizure. These symptoms may be an emergency. Do not wait to see if the symptoms will go away. Get medical help right away. Call your local emergency services (911 in the [place] Do not drive yourself to the hospital. Summary  Atrial fibrillation is a type of heartbeat that is irregular or fast.  You are at higher risk of this condition if you smoke, are older, have diabetes, or are overweight.  Follow your doctor's instructions about medicines, diet, exercise, and follow-up visits.  Get help right away if you have signs or symptoms of a stroke.  Get help right away if you cannot catch your breath, or you have chest pain or discomfort. This information is not intended to replace advice given to you by your health care provider. Make sure you discuss any questions you have with your health care provider. Document Revised: 07/06/2018 Document Reviewed: 07/06/2018 Elsevier Patient Education  [place].

## 2019-09-22 ENCOUNTER — Other Ambulatory Visit: Payer: Self-pay | Admitting: *Deleted

## 2019-09-22 DIAGNOSIS — H9193 Unspecified hearing loss, bilateral: Secondary | ICD-10-CM

## 2019-09-22 DIAGNOSIS — I10 Essential (primary) hypertension: Secondary | ICD-10-CM

## 2019-09-22 DIAGNOSIS — E6609 Other obesity due to excess calories: Secondary | ICD-10-CM

## 2019-09-22 DIAGNOSIS — F418 Other specified anxiety disorders: Secondary | ICD-10-CM

## 2019-09-22 DIAGNOSIS — I25118 Atherosclerotic heart disease of native coronary artery with other forms of angina pectoris: Secondary | ICD-10-CM

## 2019-09-22 DIAGNOSIS — E7800 Pure hypercholesterolemia, unspecified: Secondary | ICD-10-CM

## 2019-09-22 DIAGNOSIS — I480 Paroxysmal atrial fibrillation: Secondary | ICD-10-CM

## 2019-09-22 MED ORDER — NITROGLYCERIN 0.4 MG SL SUBL: 0.4000 mg | SUBLINGUAL_TABLET | SUBLINGUAL | 3 refills | Status: AC | PRN

## 2019-09-25 ENCOUNTER — Ambulatory Visit: Payer: Self-pay | Admitting: Pharmacist

## 2019-09-25 NOTE — Chronic Care Management (AMB) (Signed)
  Chronic Care Management   Follow Up Note   09/25/2019 Name: Eddie Price MRN: [id number] DOB: [birth year]  Referred by: Alycia Rossetti, MD Reason for referral : No chief complaint on file.   Eddie Price is a 79 y.o. year old male who is a primary care patient of [place], Modena Nunnery, MD. The CCM team was consulted for assistance with chronic disease management and care coordination needs.    Review of patient status, including review of consultants reports, relevant laboratory and other test results, and collaboration with appropriate care team members and the patient's provider was performed as part of comprehensive patient evaluation and provision of chronic care management services.    SDOH (Social Determinants of Health) assessments performed: No See Care Plan activities for detailed interventions related to [facility])     Outpatient Encounter Medications as of 09/25/2019  Medication Sig  . aspirin EC 81 MG tablet Take 81 mg by mouth daily.  Marland Kitchen atenolol (TENORMIN) 50 MG tablet Take 1 tablet by mouth once daily  . atorvastatin (LIPITOR) 40 MG tablet Take 1 tablet by mouth once daily  . ELIQUIS 5 MG TABS tablet Take 1 tablet by mouth twice daily  . isosorbide mononitrate (IMDUR) 30 MG 24 hr tablet Take 1 tablet (30 mg total) by mouth daily.  . nitroGLYCERIN (NITROSTAT) 0.4 MG SL tablet Place 1 tablet (0.4 mg total) under the tongue every 5 (five) minutes x 3 doses as needed for chest pain.  Marland Kitchen sertraline (ZOLOFT) 100 MG tablet Take 1 tablet by mouth once daily  . sildenafil (VIAGRA) 50 MG tablet TAKE 1 TABLET BY MOUTH ONCE DAILY AS NEEDED FOR ERECTILE DYSFUNCTION   No facility-administered encounter medications on file as of 09/25/2019.     Objective:  Utilized [facility] bridge to analyze care gaps and review adherence information for Star measures. Goals Addressed   None   Care Gaps identified - 0   Adherence measures  Atorvastatin 40mg   PDC = 89%  Days missed = 22    Plan:   The patient has been provided with contact information for the care management team and has been advised to call with any health related questions or concerns.    Beverly Milch, PharmD Clinical Pharmacist [place] [phone]

## 2019-10-30 DIAGNOSIS — M9904 Segmental and somatic dysfunction of sacral region: Secondary | ICD-10-CM | POA: Diagnosis not present

## 2019-10-30 DIAGNOSIS — M9902 Segmental and somatic dysfunction of thoracic region: Secondary | ICD-10-CM | POA: Diagnosis not present

## 2019-11-01 DIAGNOSIS — M9904 Segmental and somatic dysfunction of sacral region: Secondary | ICD-10-CM | POA: Diagnosis not present

## 2019-11-01 DIAGNOSIS — M9902 Segmental and somatic dysfunction of thoracic region: Secondary | ICD-10-CM | POA: Diagnosis not present

## 2019-11-23 ENCOUNTER — Other Ambulatory Visit: Payer: Self-pay | Admitting: Family Medicine

## 2019-11-29 DIAGNOSIS — Z7982 Long term (current) use of aspirin: Secondary | ICD-10-CM | POA: Diagnosis not present

## 2019-11-29 DIAGNOSIS — Z881 Allergy status to other antibiotic agents status: Secondary | ICD-10-CM | POA: Diagnosis not present

## 2019-11-29 DIAGNOSIS — M25552 Pain in left hip: Secondary | ICD-10-CM | POA: Diagnosis not present

## 2019-11-29 DIAGNOSIS — R031 Nonspecific low blood-pressure reading: Secondary | ICD-10-CM | POA: Diagnosis not present

## 2019-11-29 DIAGNOSIS — Z79899 Other long term (current) drug therapy: Secondary | ICD-10-CM | POA: Diagnosis not present

## 2019-11-29 DIAGNOSIS — Z87891 Personal history of nicotine dependence: Secondary | ICD-10-CM | POA: Diagnosis not present

## 2019-11-29 DIAGNOSIS — Z882 Allergy status to sulfonamides status: Secondary | ICD-10-CM | POA: Diagnosis not present

## 2019-11-29 DIAGNOSIS — Z7901 Long term (current) use of anticoagulants: Secondary | ICD-10-CM | POA: Diagnosis not present

## 2019-11-29 DIAGNOSIS — M1612 Unilateral primary osteoarthritis, left hip: Secondary | ICD-10-CM | POA: Diagnosis not present

## 2019-11-30 ENCOUNTER — Telehealth: Payer: Self-pay | Admitting: Cardiology

## 2019-11-30 ENCOUNTER — Other Ambulatory Visit: Payer: Self-pay

## 2019-11-30 MED ORDER — ATORVASTATIN CALCIUM 40 MG PO TABS: 40.0000 mg | ORAL_TABLET | Freq: Every day | ORAL | 1 refills | Status: DC

## 2019-11-30 MED ORDER — ATORVASTATIN CALCIUM 40 MG PO TABS: 40.0000 mg | ORAL_TABLET | Freq: Every day | ORAL | 3 refills | Status: DC

## 2019-11-30 NOTE — Telephone Encounter (Signed)
New message      *STAT* If patient is at the pharmacy, call can be transferred to refill team.   1. Which medications need to be refilled? (please list name of each medication and dose if known) atorvastatin (LIPITOR) 40 MG tablet  2. Which pharmacy/location (including street and city if local pharmacy) is medication to be sent to? [place]   3. Do they need a 30 day or 90 day supply? [place]

## 2019-11-30 NOTE — Telephone Encounter (Signed)
Refill sent.

## 2019-12-25 ENCOUNTER — Ambulatory Visit: Payer: Medicare Other | Admitting: Cardiovascular Disease

## 2019-12-27 ENCOUNTER — Ambulatory Visit: Payer: Medicare Other | Admitting: Cardiology

## 2020-02-07 ENCOUNTER — Telehealth: Payer: Self-pay | Admitting: Pharmacist

## 2020-02-07 NOTE — Progress Notes (Addendum)
    Chronic Care Management Pharmacy Assistant   Name: Eddie Price  MRN: [id number] DOB: [birth year]  Reason for Encounter: General Disease State Call  Patient Questions:  1.  Have you seen any other providers since your last visit? Yes.   2.  Any changes in your medicines or health? Yes.   PCP : Alycia Rossetti, MD   Their chronic conditions include: HTN, Afib, HLD, anxiety/depression.   Office Visits: None since 09/25/19  Consults: None since 09/25/19  Hospital: 11/29/19 Dr. Coralie Carpen for Left hip pain Images taken. Mild superior left hip joint space narrowing. STARTED Dexamethasone 4 mg for 5 days, Ondansetron 4 mg for 3 days, and Oxycodone HCL 5 mg for 7 days.  Allergies:   Allergies  Allergen Reactions   Sulfa Antibiotics Rash    Medications: Outpatient Encounter Medications as of 02/07/2020  Medication Sig   aspirin EC 81 MG tablet Take 81 mg by mouth daily.   atenolol (TENORMIN) 50 MG tablet Take 1 tablet by mouth once daily   atorvastatin (LIPITOR) 40 MG tablet Take 1 tablet (40 mg total) by mouth daily.   ELIQUIS 5 MG TABS tablet Take 1 tablet by mouth twice daily   isosorbide mononitrate (IMDUR) 30 MG 24 hr tablet Take 1 tablet (30 mg total) by mouth daily.   nitroGLYCERIN (NITROSTAT) 0.4 MG SL tablet Place 1 tablet (0.4 mg total) under the tongue every 5 (five) minutes x 3 doses as needed for chest pain.   sertraline (ZOLOFT) 100 MG tablet Take 1 tablet by mouth once daily   sildenafil (VIAGRA) 50 MG tablet TAKE 1 TABLET BY MOUTH ONCE DAILY AS NEEDED FOR ERECTILE DYSFUNCTION   No facility-administered encounter medications on file as of 02/07/2020.    Current Diagnosis: Patient Active Problem List   Diagnosis Date Noted   Long term current use of anticoagulant therapy 12/09/2015   History of colonic polyps 12/09/2015   S/P knee replacement 01/03/2015   S/P left knee arthroscopy 05/08/2014   Atrial fibrillation ([place]) 01/31/2014   Olecranon bursitis  08/29/2013   OA (osteoarthritis) of knee 08/29/2013   Depression with anxiety 10/22/2012   Obesity 10/22/2012   Hyperlipidemia    Coronary atherosclerosis of native coronary artery 06/16/2012   Unstable angina ([place]) 06/16/2012   Esophageal reflux 06/13/2012   Essential hypertension, benign 06/13/2012   Impotence of organic origin 06/13/2012   Degeneration of cervical intervertebral disc 06/13/2012    Goals Addressed   None    Patient stated his left hip/back pain is better.Patient stated he currently moved and will be going to another healthcare organization. He stated he doesn't need any medications or doesn't have any questions in the mean time while he waits to see his new provider.   Follow-Up:  Pharmacist Review   Charlann Lange, [place] Pharmacist Assistant [phone]  2 minutes spent in review, coordination, and documentation.  Reviewed by: Beverly Milch, PharmD Clinical Pharmacist [facility] [phone]

## 2020-02-15 ENCOUNTER — Telehealth: Payer: Self-pay | Admitting: Pharmacist

## 2020-02-15 NOTE — Progress Notes (Signed)
    Chronic Care Management Pharmacy Assistant   Name: Eddie Price  MRN: [id number] DOB: April 01, 1940  Reason for Encounter: Adherence   Patient Questions:  1.  Have you seen any other providers since your last visit? No.    2.  Any changes in your medicines or health? No.  PCP : Alycia Rossetti, MD  Verified Adherence Gap Information. Per insurance data, the patient is 90-99% compliant with the following medication: Atorvastatin 40 mg. Last fill date was 11/30/19 and day supply was 90.  Follow-Up:  Pharmacist Review   Charlann Lange, [place] Pharmacist Assistant [phone]

## 2020-02-21 ENCOUNTER — Ambulatory Visit: Payer: Medicare Other | Admitting: Family Medicine

## 2020-03-25 ENCOUNTER — Other Ambulatory Visit: Payer: Self-pay | Admitting: Student

## 2020-03-25 NOTE — Telephone Encounter (Signed)
This is a [place] pt.  °

## 2020-03-29 ENCOUNTER — Telehealth: Payer: Self-pay | Admitting: *Deleted

## 2020-03-29 ENCOUNTER — Other Ambulatory Visit: Payer: Self-pay | Admitting: Family Medicine

## 2020-03-29 ENCOUNTER — Telehealth: Payer: Self-pay

## 2020-03-29 NOTE — Telephone Encounter (Signed)
Transition Care Management Unsuccessful Follow-up Telephone Call  Date of discharge and from where:  03/29/2020 - [facility]  Attempts:  1st Attempt  Reason for unsuccessful TCM follow-up call:  Left voice message

## 2020-04-01 NOTE — Telephone Encounter (Signed)
Transition Care Management Unsuccessful Follow-up Telephone Call  Date of discharge and from where:  03/29/2020 - [facility]  Attempts:  2nd Attempt  Reason for unsuccessful TCM follow-up call:  Left voice message

## 2020-04-02 NOTE — Telephone Encounter (Signed)
Transition Care Management Unsuccessful Follow-up Telephone Call  Date of discharge and from where:  03/29/2020 - [facility]  Attempts:  3rd Attempt  Reason for unsuccessful TCM follow-up call:  Left voice message

## 2020-11-18 ENCOUNTER — Encounter: Payer: Self-pay | Admitting: *Deleted

## 2021-06-02 ENCOUNTER — Other Ambulatory Visit: Payer: Self-pay | Admitting: Student

## 2021-06-25 ENCOUNTER — Other Ambulatory Visit: Payer: Self-pay | Admitting: Student
